# Patient Record
Sex: Female | Born: 1935 | Race: White | Hispanic: No | Smoking: Never smoker
Health system: Southern US, Community
[De-identification: ages and names within clinical notes are randomized; demographics above are authoritative.]

## PROBLEM LIST (undated history)

## (undated) DIAGNOSIS — G629 Polyneuropathy, unspecified: Secondary | ICD-10-CM

## (undated) DIAGNOSIS — I10 Essential (primary) hypertension: Secondary | ICD-10-CM

## (undated) DIAGNOSIS — E039 Hypothyroidism, unspecified: Secondary | ICD-10-CM

## (undated) DIAGNOSIS — K219 Gastro-esophageal reflux disease without esophagitis: Secondary | ICD-10-CM

## (undated) HISTORY — PX: ABDOMINAL HYSTERECTOMY: SHX81

## (undated) HISTORY — PX: CATARACT EXTRACTION: SUR2

## (undated) HISTORY — PX: BACK SURGERY: SHX140

---

## 2003-04-14 ENCOUNTER — Inpatient Hospital Stay (HOSPITAL_COMMUNITY): Admission: RE | Admit: 2003-04-14 | Discharge: 2003-04-15 | Payer: Self-pay | Admitting: Neurosurgery

## 2003-10-17 HISTORY — PX: UPPER GI ENDOSCOPY: SHX6162

## 2004-10-02 ENCOUNTER — Emergency Department: Payer: Self-pay | Admitting: Emergency Medicine

## 2005-02-24 ENCOUNTER — Ambulatory Visit: Payer: Self-pay | Admitting: Internal Medicine

## 2005-07-18 ENCOUNTER — Ambulatory Visit (HOSPITAL_COMMUNITY): Admission: RE | Admit: 2005-07-18 | Discharge: 2005-07-18 | Payer: Self-pay | Admitting: Ophthalmology

## 2005-10-17 ENCOUNTER — Encounter: Admission: RE | Admit: 2005-10-17 | Discharge: 2005-10-17 | Payer: Self-pay | Admitting: Neurosurgery

## 2005-11-08 ENCOUNTER — Inpatient Hospital Stay (HOSPITAL_COMMUNITY): Admission: RE | Admit: 2005-11-08 | Discharge: 2005-11-09 | Payer: Self-pay | Admitting: Neurosurgery

## 2007-07-22 IMAGING — CR DG CHEST 2V
2 series · 2 of 2 positions shown · non-contrast
Comparison: none

CLINICAL DATA: Dislocated lens.  Pre-op for surgery today.  Hypertension, some congestion and asthma.  Nonsmoker.  
 CHEST - 2 VIEW:
 PA and lateral views of the chest are made and are compared with report of a previous study of 04/10/2003 and show mild aortic elongation.   Heart is within the normal limit for size and contour.  The lungs are clear.  Bony thorax is normal except for small anterior degenerative hypertrophic spurs of the mid-thoracic spine.

[view not recorded (1 of 2)]
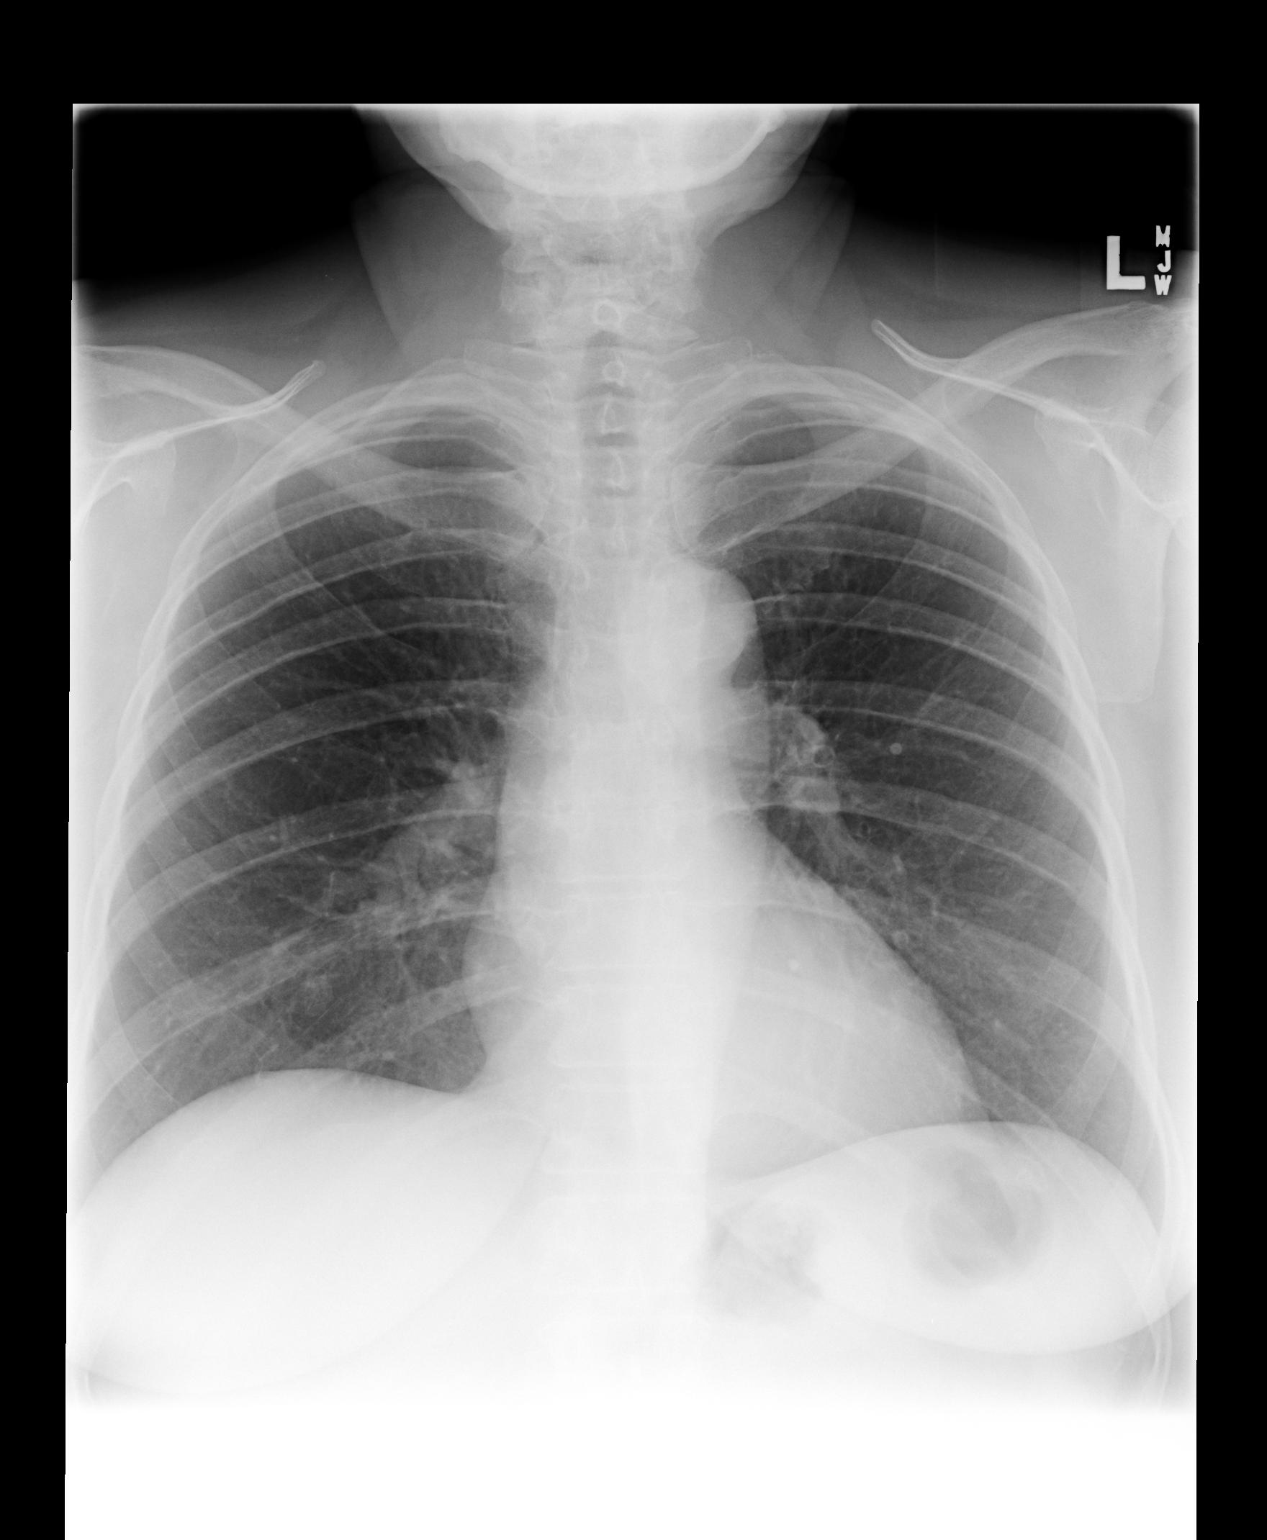

[view not recorded (2 of 2)]
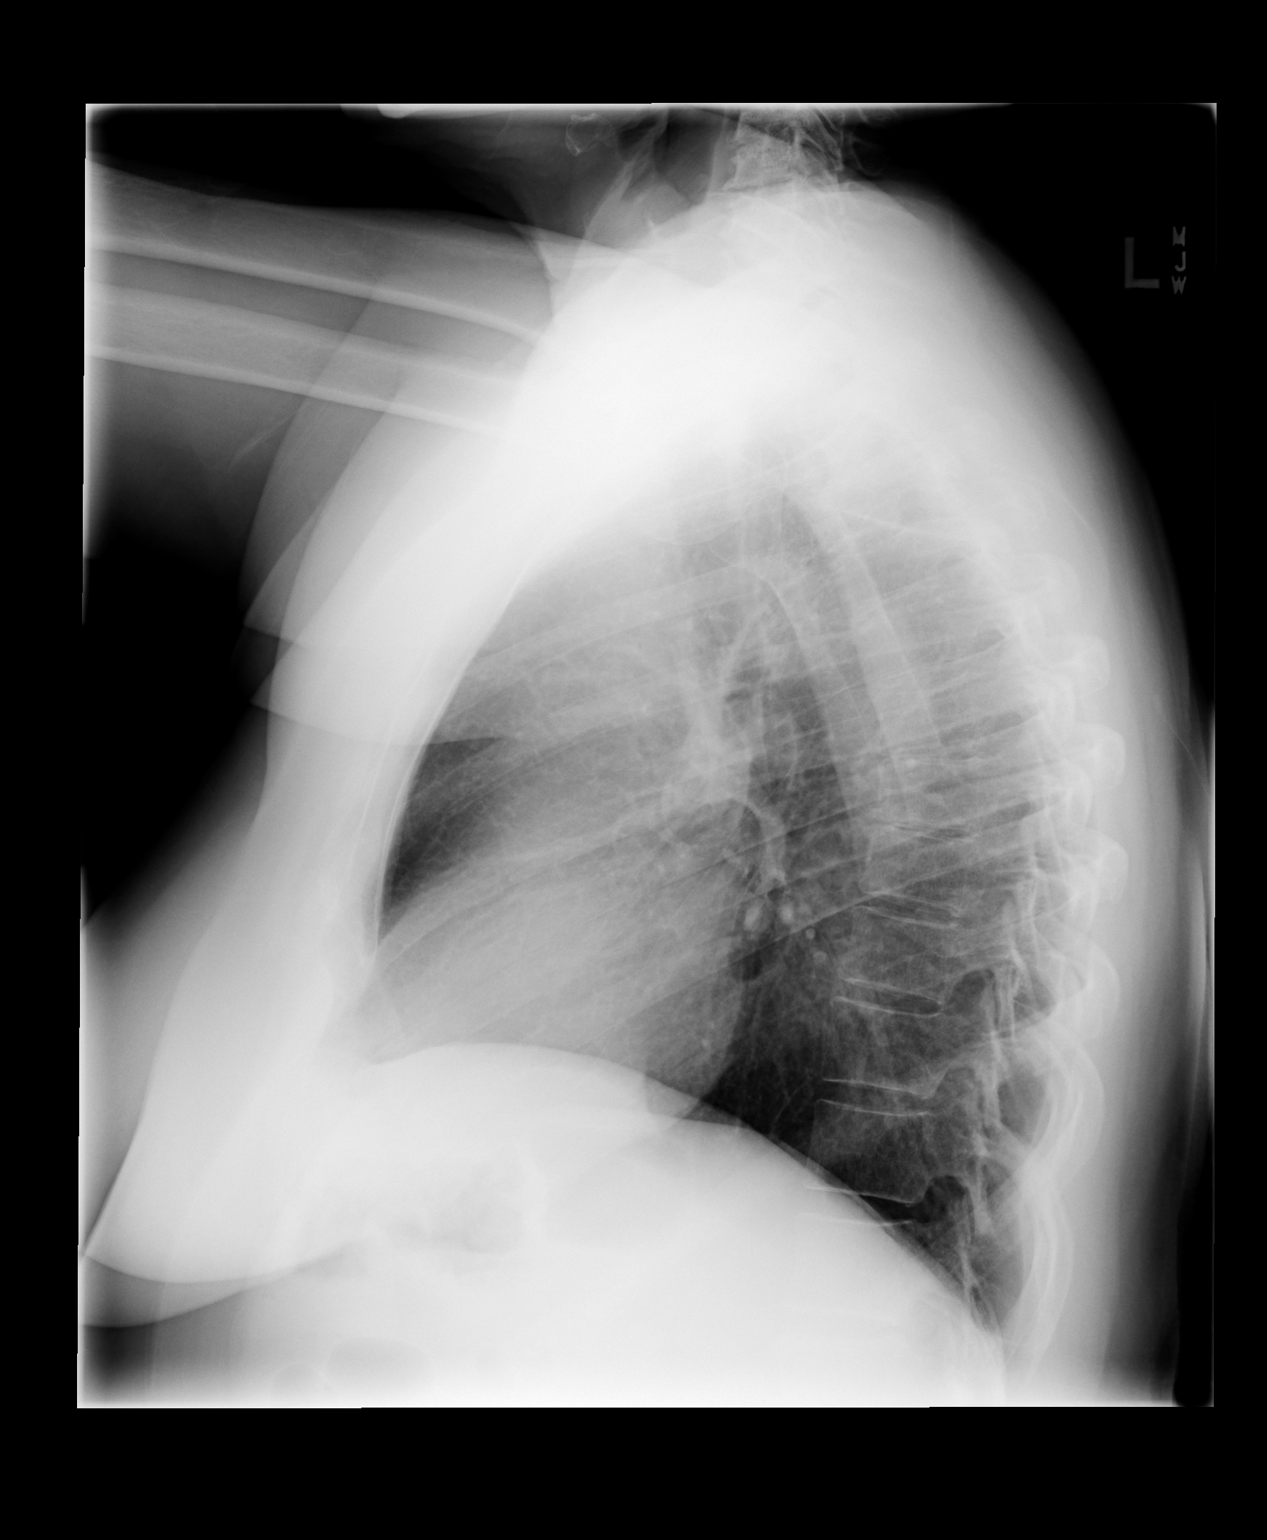

[2 of 2 positions shown; findings below may reference images not displayed]

IMPRESSION: No evidence of active cardiopulmonary disease.

## 2008-03-27 LAB — HM DEXA SCAN

## 2009-01-13 ENCOUNTER — Ambulatory Visit: Payer: Self-pay | Admitting: Family Medicine

## 2009-11-17 ENCOUNTER — Ambulatory Visit: Payer: Self-pay | Admitting: Family Medicine

## 2010-02-17 ENCOUNTER — Ambulatory Visit: Payer: Self-pay

## 2010-12-17 ENCOUNTER — Ambulatory Visit: Payer: Self-pay | Admitting: Unknown Physician Specialty

## 2010-12-17 LAB — HM COLONOSCOPY

## 2010-12-20 LAB — PATHOLOGY REPORT

## 2011-01-20 ENCOUNTER — Ambulatory Visit: Payer: Self-pay | Admitting: Family Medicine

## 2014-08-18 LAB — HEPATIC FUNCTION PANEL
ALT: 15 U/L (ref 7–35)
AST: 21 U/L (ref 13–35)
Alkaline Phosphatase: 65 U/L (ref 25–125)

## 2014-08-18 LAB — LIPID PANEL
Cholesterol: 188 mg/dL (ref 0–200)
HDL: 59 mg/dL (ref 35–70)
LDL Cholesterol: 102 mg/dL
LDl/HDL Ratio: 1.7
Triglycerides: 136 mg/dL (ref 40–160)

## 2014-08-18 LAB — CBC AND DIFFERENTIAL
HCT: 38 % (ref 36–46)
Hemoglobin: 12.4 g/dL (ref 12.0–16.0)
Neutrophils Absolute: 4 /uL
Platelets: 288 10*3/uL (ref 150–399)
WBC: 8.1 10^3/mL

## 2014-08-18 LAB — BASIC METABOLIC PANEL
BUN: 26 mg/dL — AB (ref 4–21)
Creatinine: 1 mg/dL (ref 0.5–1.1)
Glucose: 94 mg/dL
Potassium: 4.3 mmol/L (ref 3.4–5.3)
Sodium: 142 mmol/L (ref 137–147)

## 2014-08-18 LAB — TSH: TSH: 1.17 u[IU]/mL (ref 0.41–5.90)

## 2015-02-06 DIAGNOSIS — J309 Allergic rhinitis, unspecified: Secondary | ICD-10-CM | POA: Insufficient documentation

## 2015-02-06 DIAGNOSIS — M419 Scoliosis, unspecified: Secondary | ICD-10-CM | POA: Insufficient documentation

## 2015-02-06 DIAGNOSIS — M549 Dorsalgia, unspecified: Secondary | ICD-10-CM

## 2015-02-06 DIAGNOSIS — K219 Gastro-esophageal reflux disease without esophagitis: Secondary | ICD-10-CM | POA: Insufficient documentation

## 2015-02-06 DIAGNOSIS — Z86010 Personal history of colonic polyps: Secondary | ICD-10-CM | POA: Insufficient documentation

## 2015-02-06 DIAGNOSIS — E538 Deficiency of other specified B group vitamins: Secondary | ICD-10-CM | POA: Insufficient documentation

## 2015-02-06 DIAGNOSIS — E559 Vitamin D deficiency, unspecified: Secondary | ICD-10-CM | POA: Insufficient documentation

## 2015-02-06 DIAGNOSIS — G8929 Other chronic pain: Secondary | ICD-10-CM | POA: Insufficient documentation

## 2015-02-06 DIAGNOSIS — E7800 Pure hypercholesterolemia, unspecified: Secondary | ICD-10-CM | POA: Insufficient documentation

## 2015-02-06 DIAGNOSIS — N951 Menopausal and female climacteric states: Secondary | ICD-10-CM | POA: Insufficient documentation

## 2015-02-06 DIAGNOSIS — M8580 Other specified disorders of bone density and structure, unspecified site: Secondary | ICD-10-CM | POA: Insufficient documentation

## 2015-02-06 DIAGNOSIS — M1990 Unspecified osteoarthritis, unspecified site: Secondary | ICD-10-CM | POA: Insufficient documentation

## 2015-02-06 DIAGNOSIS — I10 Essential (primary) hypertension: Secondary | ICD-10-CM | POA: Insufficient documentation

## 2015-02-06 DIAGNOSIS — N393 Stress incontinence (female) (male): Secondary | ICD-10-CM | POA: Insufficient documentation

## 2015-02-06 DIAGNOSIS — J449 Chronic obstructive pulmonary disease, unspecified: Secondary | ICD-10-CM | POA: Insufficient documentation

## 2015-02-06 DIAGNOSIS — E039 Hypothyroidism, unspecified: Secondary | ICD-10-CM | POA: Insufficient documentation

## 2015-02-06 DIAGNOSIS — G629 Polyneuropathy, unspecified: Secondary | ICD-10-CM | POA: Insufficient documentation

## 2015-02-09 ENCOUNTER — Ambulatory Visit (INDEPENDENT_AMBULATORY_CARE_PROVIDER_SITE_OTHER): Payer: Medicare Other | Admitting: Family Medicine

## 2015-02-09 ENCOUNTER — Encounter: Payer: Self-pay | Admitting: Family Medicine

## 2015-02-09 VITALS — BP 120/78 | HR 60 | Temp 98.4°F | Resp 16 | Wt 138.4 lb

## 2015-02-09 DIAGNOSIS — K219 Gastro-esophageal reflux disease without esophagitis: Secondary | ICD-10-CM | POA: Diagnosis not present

## 2015-02-09 DIAGNOSIS — I10 Essential (primary) hypertension: Secondary | ICD-10-CM

## 2015-02-09 DIAGNOSIS — Z23 Encounter for immunization: Secondary | ICD-10-CM | POA: Diagnosis not present

## 2015-02-09 DIAGNOSIS — J309 Allergic rhinitis, unspecified: Secondary | ICD-10-CM | POA: Diagnosis not present

## 2015-02-09 DIAGNOSIS — E039 Hypothyroidism, unspecified: Secondary | ICD-10-CM | POA: Diagnosis not present

## 2015-02-09 MED ORDER — LORATADINE 10 MG PO TBDP: 10.0000 mg | ORAL_TABLET | Freq: Every day | ORAL | Status: DC

## 2015-02-09 NOTE — Progress Notes (Signed)
       Patient: Heidi Yoder SeenRebecca D Hiott Female    DOB: 1929/12/11   81 y.o.   MRN: [id number] Visit Date: 02/09/2015  Today's Provider: Megan Mansichard  Jr, MD   Chief Complaint  Patient presents with  . Hypertension  . Gastroesophageal Reflux  . Hypothyroidism   Subjective:    HPI 1. Hypothyroidism, unspecified hypothyroidism type: Last Clinic visit was 08/14/2014 for Hypothyroidism. No changes in management. No palpitations, insomnia, leg swelling lately, and no weakness.   2. Essential (primary) hypertension: Last office visit blood pressure was 124/60 and today 120/78. No changes in management. She is not having any chest pain,dizziness, headache or lightlessness.   3. Gastroesophageal reflux disease, esophagitis presence not specified: Per patient have not had any flare up. Last flare up was 2-3 weeks ago because ate some hot ships and was having a lot of pain in the night before bed time. Took some Tums and it helped.     No Known Allergies Previous Medications   ASPIRIN 81 MG TABLET    Take by mouth.   CALCIUM-VITAMIN D PO    Take by mouth.   CHOLECALCIFEROL (VITAMIN D) 1000 UNITS TABLET    Take by mouth.   HYDROCHLOROTHIAZIDE (HYDRODIURIL) 25 MG TABLET    Take by mouth.   LEVOTHYROXINE (SYNTHROID, LEVOTHROID) 50 MCG TABLET    Take by mouth.   LORATADINE (CLARITIN REDITABS) 10 MG DISSOLVABLE TABLET    Take by mouth.   MULTIPLE VITAMIN PO    Take by mouth.   OMEPRAZOLE (PRILOSEC) 20 MG CAPSULE    Take by mouth.   OXYBUTYNIN (DITROPAN) 5 MG TABLET    Take by mouth.   SIMVASTATIN (ZOCOR) 20 MG TABLET    Take by mouth.    Review of Systems  Constitutional: Negative.   HENT: Positive for rhinorrhea and sinus pressure.   Eyes: Positive for itching.  Respiratory: Positive for cough.   Cardiovascular: Negative.   Gastrointestinal: Negative.   Endocrine: Negative.   Neurological: Negative.   Hematological: Negative.   Psychiatric/Behavioral: Negative.     Social History   Substance Use Topics  . Smoking status: Never Smoker   . Smokeless tobacco: Not on file  . Alcohol Use: No   Objective:   BP 120/78 mmHg  Pulse 60  Temp(Src) 98.4 F (36.9 C) (Oral)  Resp 16  Wt 138 lb 6.4 oz (62.778 kg)  Physical Exam  Constitutional: She is oriented to person, place, and time. She appears well-developed and well-nourished.  HENT:  Head: Normocephalic and atraumatic.  Right Ear: External ear normal.  Left Ear: External ear normal.  Nose: Nose normal.  Eyes: Conjunctivae are normal.  Cardiovascular: Normal rate, regular rhythm and normal heart sounds.   Pulmonary/Chest: Effort normal and breath sounds normal.  Abdominal: Soft.  Neurological: She is alert and oriented to person, place, and time.  Skin: Skin is warm and dry.  Psychiatric: She has a normal mood and affect. Her behavior is normal. Judgment and thought content normal.        Assessment & Plan:     1. Hypothyroidism, unspecified hypothyroidism type   2. Essential (primary) hypertension   3. Gastroesophageal reflux disease, esophagitis presence not specified 4.AR Try Loratadine 10mg  daily.      Carsyn Boster Wendelyn Breslow Jr, MD  [facility]

## 2015-03-17 ENCOUNTER — Other Ambulatory Visit: Payer: Self-pay | Admitting: Family Medicine

## 2015-04-03 ENCOUNTER — Encounter: Payer: Self-pay | Admitting: Emergency Medicine

## 2015-04-03 ENCOUNTER — Emergency Department: Payer: Medicare Other

## 2015-04-03 ENCOUNTER — Inpatient Hospital Stay
Admission: EM | Admit: 2015-04-03 | Discharge: 2015-04-07 | DRG: 482 | Disposition: A | Discharge status: Home Health | Payer: Medicare Other | Attending: Internal Medicine | Admitting: Internal Medicine

## 2015-04-03 DIAGNOSIS — I10 Essential (primary) hypertension: Secondary | ICD-10-CM | POA: Diagnosis present

## 2015-04-03 DIAGNOSIS — E876 Hypokalemia: Secondary | ICD-10-CM | POA: Diagnosis present

## 2015-04-03 DIAGNOSIS — E039 Hypothyroidism, unspecified: Secondary | ICD-10-CM | POA: Diagnosis present

## 2015-04-03 DIAGNOSIS — S72002A Fracture of unspecified part of neck of left femur, initial encounter for closed fracture: Secondary | ICD-10-CM

## 2015-04-03 DIAGNOSIS — G629 Polyneuropathy, unspecified: Secondary | ICD-10-CM | POA: Diagnosis present

## 2015-04-03 DIAGNOSIS — W1809XA Striking against other object with subsequent fall, initial encounter: Secondary | ICD-10-CM | POA: Diagnosis present

## 2015-04-03 DIAGNOSIS — K219 Gastro-esophageal reflux disease without esophagitis: Secondary | ICD-10-CM | POA: Diagnosis present

## 2015-04-03 DIAGNOSIS — E785 Hyperlipidemia, unspecified: Secondary | ICD-10-CM | POA: Diagnosis present

## 2015-04-03 DIAGNOSIS — M810 Age-related osteoporosis without current pathological fracture: Secondary | ICD-10-CM | POA: Diagnosis present

## 2015-04-03 DIAGNOSIS — Y92009 Unspecified place in unspecified non-institutional (private) residence as the place of occurrence of the external cause: Secondary | ICD-10-CM

## 2015-04-03 DIAGNOSIS — S72035A Nondisplaced midcervical fracture of left femur, initial encounter for closed fracture: Principal | ICD-10-CM | POA: Diagnosis present

## 2015-04-03 HISTORY — DX: Polyneuropathy, unspecified: G62.9

## 2015-04-03 HISTORY — DX: Gastro-esophageal reflux disease without esophagitis: K21.9

## 2015-04-03 HISTORY — DX: Hypothyroidism, unspecified: E03.9

## 2015-04-03 HISTORY — DX: Essential (primary) hypertension: I10

## 2015-04-03 LAB — URINALYSIS COMPLETE WITH MICROSCOPIC (ARMC ONLY)
BILIRUBIN URINE: NEGATIVE
Bacteria, UA: NONE SEEN
GLUCOSE, UA: NEGATIVE mg/dL
HGB URINE DIPSTICK: NEGATIVE
KETONES UR: NEGATIVE mg/dL
LEUKOCYTES UA: NEGATIVE
NITRITE: NEGATIVE
Protein, ur: NEGATIVE mg/dL
RBC / HPF: NONE SEEN RBC/hpf (ref 0–5)
SPECIFIC GRAVITY, URINE: 1.009 (ref 1.005–1.030)
Squamous Epithelial / LPF: NONE SEEN
pH: 6 (ref 5.0–8.0)

## 2015-04-03 LAB — COMPREHENSIVE METABOLIC PANEL
ALT: 20 U/L (ref 14–54)
AST: 24 U/L (ref 15–41)
Albumin: 3.9 g/dL (ref 3.5–5.0)
Alkaline Phosphatase: 50 U/L (ref 38–126)
Anion gap: 11 (ref 5–15)
BUN: 20 mg/dL (ref 6–20)
CHLORIDE: 104 mmol/L (ref 101–111)
CO2: 27 mmol/L (ref 22–32)
CREATININE: 0.83 mg/dL (ref 0.44–1.00)
Calcium: 9.7 mg/dL (ref 8.9–10.3)
GFR calc Af Amer: >60 mL/min (ref >60)
GFR calc non Af Amer: >60 mL/min (ref >60)
GLUCOSE: 89 mg/dL (ref 65–99)
Potassium: 3.3 mmol/L — ABNORMAL LOW (ref 3.5–5.1)
SODIUM: 142 mmol/L (ref 135–145)
Total Bilirubin: 0.5 mg/dL (ref 0.3–1.2)
Total Protein: 7.6 g/dL (ref 6.5–8.1)

## 2015-04-03 LAB — CBC WITH DIFFERENTIAL/PLATELET
BASOS ABS: 0.1 10*3/uL (ref 0–0.1)
Basophils Relative: 1 %
EOS ABS: 0 10*3/uL (ref 0–0.7)
EOS PCT: 0 %
HCT: 35.8 % (ref 35.0–47.0)
HEMOGLOBIN: 11.7 g/dL — AB (ref 12.0–16.0)
LYMPHS PCT: 17 %
Lymphs Abs: 2.5 10*3/uL (ref 1.0–3.6)
MCH: 30.1 pg (ref 26.0–34.0)
MCHC: 32.7 g/dL (ref 32.0–36.0)
MCV: 92 fL (ref 80.0–100.0)
Monocytes Absolute: 0.7 10*3/uL (ref 0.2–0.9)
Monocytes Relative: 5 %
NEUTROS PCT: 77 %
Neutro Abs: 11 10*3/uL — ABNORMAL HIGH (ref 1.4–6.5)
PLATELETS: 257 10*3/uL (ref 150–440)
RBC: 3.89 MIL/uL (ref 3.80–5.20)
RDW: 13 % (ref 11.5–14.5)
WBC: 14.3 10*3/uL — AB (ref 3.6–11.0)

## 2015-04-03 LAB — PROTIME-INR
INR: 1.06
Prothrombin Time: 14 seconds (ref 11.4–15.0)

## 2015-04-03 LAB — TYPE AND SCREEN
ABO/RH(D): A POS
Antibody Screen: NEGATIVE

## 2015-04-03 LAB — APTT: aPTT: 34 seconds (ref 24–36)

## 2015-04-03 LAB — TSH: TSH: 0.897 u[IU]/mL (ref 0.350–4.500)

## 2015-04-03 LAB — ABO/RH: ABO/RH(D): A POS

## 2015-04-03 MED ORDER — POLYETHYLENE GLYCOL 3350 17 G PO PACK: 17.0000 g | PACK | Freq: Every day | ORAL | Status: DC | PRN

## 2015-04-03 MED ORDER — OXYBUTYNIN CHLORIDE 5 MG PO TABS
5.0000 mg | ORAL_TABLET | Freq: Two times a day (BID) | ORAL | Status: DC
  Administered 2015-04-03: 5 mg via ORAL
  Filled 2015-04-03: qty 1

## 2015-04-03 MED ORDER — ACETAMINOPHEN 650 MG RE SUPP: 650.0000 mg | Freq: Four times a day (QID) | RECTAL | Status: DC | PRN

## 2015-04-03 MED ORDER — ONDANSETRON HCL 4 MG PO TABS: 4.0000 mg | ORAL_TABLET | Freq: Four times a day (QID) | ORAL | Status: DC | PRN

## 2015-04-03 MED ORDER — SODIUM CHLORIDE 0.45 % IV SOLN
INTRAVENOUS | Status: DC
  Administered 2015-04-03: 17:00:00 via INTRAVENOUS

## 2015-04-03 MED ORDER — POVIDONE-IODINE 7.5 % EX SOLN
Freq: Once | CUTANEOUS | Status: DC
Start: 2015-04-03 — End: 2015-04-04
  Filled 2015-04-03: qty 118

## 2015-04-03 MED ORDER — CALCIUM CARBONATE-VITAMIN D 500-200 MG-UNIT PO TABS
1.0000 | ORAL_TABLET | Freq: Two times a day (BID) | ORAL | Status: DC
  Administered 2015-04-03: 1 via ORAL
  Filled 2015-04-03: qty 1

## 2015-04-03 MED ORDER — PANTOPRAZOLE SODIUM 40 MG PO TBEC
40.0000 mg | DELAYED_RELEASE_TABLET | Freq: Every day | ORAL | Status: DC
  Administered 2015-04-03: 40 mg via ORAL

## 2015-04-03 MED ORDER — ZOLPIDEM TARTRATE 5 MG PO TABS: 5.0000 mg | ORAL_TABLET | Freq: Every evening | ORAL | Status: DC | PRN

## 2015-04-03 MED ORDER — HEPARIN SODIUM (PORCINE) 5000 UNIT/ML IJ SOLN
5000.0000 [IU] | Freq: Three times a day (TID) | INTRAMUSCULAR | Status: DC
  Filled 2015-04-03: qty 1

## 2015-04-03 MED ORDER — POTASSIUM CHLORIDE 20 MEQ PO PACK
40.0000 meq | PACK | Freq: Every day | ORAL | Status: DC
  Filled 2015-04-03: qty 2

## 2015-04-03 MED ORDER — ACETAMINOPHEN 325 MG PO TABS: 650.0000 mg | ORAL_TABLET | Freq: Four times a day (QID) | ORAL | Status: DC | PRN

## 2015-04-03 MED ORDER — METHOCARBAMOL 1000 MG/10ML IJ SOLN
500.0000 mg | Freq: Four times a day (QID) | INTRAMUSCULAR | Status: DC | PRN
  Filled 2015-04-03: qty 5

## 2015-04-03 MED ORDER — POTASSIUM CHLORIDE CRYS ER 20 MEQ PO TBCR
40.0000 meq | EXTENDED_RELEASE_TABLET | Freq: Once | ORAL | Status: AC
  Administered 2015-04-03: 40 meq via ORAL
  Filled 2015-04-03: qty 2

## 2015-04-03 MED ORDER — POTASSIUM CHLORIDE 20 MEQ PO PACK
20.0000 meq | PACK | Freq: Every day | ORAL | Status: DC
  Administered 2015-04-03: 20 meq via ORAL

## 2015-04-03 MED ORDER — MORPHINE SULFATE (PF) 2 MG/ML IV SOLN
0.5000 mg | INTRAVENOUS | Status: DC | PRN
  Administered 2015-04-03 – 2015-04-04 (×3): 0.5 mg via INTRAVENOUS
  Filled 2015-04-03 (×3): qty 1

## 2015-04-03 MED ORDER — HYDROCHLOROTHIAZIDE 25 MG PO TABS
25.0000 mg | ORAL_TABLET | Freq: Every day | ORAL | Status: DC
  Filled 2015-04-03: qty 1

## 2015-04-03 MED ORDER — HYDROCODONE-ACETAMINOPHEN 5-325 MG PO TABS: 1.0000 | ORAL_TABLET | Freq: Four times a day (QID) | ORAL | Status: DC | PRN

## 2015-04-03 MED ORDER — CEFAZOLIN SODIUM-DEXTROSE 2-3 GM-% IV SOLR
2.0000 g | INTRAVENOUS | Status: DC
  Filled 2015-04-03: qty 50

## 2015-04-03 MED ORDER — ASPIRIN 81 MG PO TABS: 81.0000 mg | ORAL_TABLET | Freq: Every day | ORAL | Status: DC

## 2015-04-03 MED ORDER — HYDROCODONE-ACETAMINOPHEN 5-325 MG PO TABS
1.0000 | ORAL_TABLET | ORAL | Status: DC | PRN
  Administered 2015-04-03 – 2015-04-04 (×3): 1 via ORAL
  Filled 2015-04-03 (×3): qty 1

## 2015-04-03 MED ORDER — METHOCARBAMOL 500 MG PO TABS: 500.0000 mg | ORAL_TABLET | Freq: Four times a day (QID) | ORAL | Status: DC | PRN

## 2015-04-03 MED ORDER — ONDANSETRON HCL 4 MG/2ML IJ SOLN
4.0000 mg | Freq: Four times a day (QID) | INTRAMUSCULAR | Status: DC | PRN
  Administered 2015-04-04: 4 mg via INTRAVENOUS
  Filled 2015-04-03: qty 2

## 2015-04-03 MED ORDER — VITAMIN D 1000 UNITS PO TABS
1000.0000 [IU] | ORAL_TABLET | Freq: Every day | ORAL | Status: DC
  Filled 2015-04-03: qty 1

## 2015-04-03 MED ORDER — SENNA 8.6 MG PO TABS
1.0000 | ORAL_TABLET | Freq: Two times a day (BID) | ORAL | Status: DC
  Administered 2015-04-03: 8.6 mg via ORAL
  Filled 2015-04-03: qty 1

## 2015-04-03 MED ORDER — MORPHINE SULFATE (PF) 2 MG/ML IV SOLN: 2.0000 mg | INTRAVENOUS | Status: DC | PRN

## 2015-04-03 MED ORDER — SIMVASTATIN 20 MG PO TABS
20.0000 mg | ORAL_TABLET | Freq: Every day | ORAL | Status: DC
  Administered 2015-04-03: 20 mg via ORAL
  Filled 2015-04-03: qty 1

## 2015-04-03 MED ORDER — LEVOTHYROXINE SODIUM 50 MCG PO TABS: 50.0000 ug | ORAL_TABLET | Freq: Every day | ORAL | Status: DC

## 2015-04-03 NOTE — ED Notes (Signed)
Pt presents with left upper leg pain after falling this am and landing on her left side. Pt states she tripped and fell on hard wood flooring. Pt unable to bear full weight on left leg. No shortening or rotation noted. Cms intact.

## 2015-04-03 NOTE — ED Notes (Signed)
States she tripped this am  Larey SeatFell  Having pain to left hip/leg

## 2015-04-03 NOTE — Progress Notes (Signed)
Plan of care discussed with patient. Patient scheduled for surgery 04/04/2015. Patient teaching giving to patient on NPO status after midnight. Patient demonstrates correct use of incentive spirometer. Pain controlled with PRN medication.

## 2015-04-03 NOTE — H&P (Signed)
[facility] [place] [facility]   PATIENT NAME: Heidi Yoder    MR#:  [id number]  DATE OF BIRTH:  1929-09-20  DATE OF ADMISSION:  04/03/2015  PRIMARY CARE PHYSICIAN: Megan Mans, MD   REQUESTING/REFERRING PHYSICIAN: Dr. Alphonzo Lemmings  CHIEF COMPLAINT:   Chief Complaint  Patient presents with  . Fall    HISTORY OF PRESENT ILLNESS:  Heidi Yoder  is a 81 y.o. female with a known history of bilateral lower extremity peripheral neuropathy, hypothyroidism, hypertension and GERD presents today after mechanical fall and is found to have left femoral neck fracture. She reports that she was on the phone this morning and after hanging up she turned to walk to the door and tripped over the cord to her husband's electronic chair. She fell on her left side and then had to slide across the floor so she could pull up. She then had to walk to the front door to get her husband's attention. She had significant pain in the hip and leg and so was brought to the emergency room. X-ray shows minimally displaced left femoral transcervical neck fracture. She has been seen by orthopedics and plan is for surgical repair in the morning.  PAST MEDICAL HISTORY:   Past Medical History  Diagnosis Date  . Hypertension   . Hypothyroidism   . GERD (gastroesophageal reflux disease)   . Peripheral neuropathy (HCC)     PAST SURGICAL HISTORY:   Past Surgical History  Procedure Laterality Date  . Back surgery      spinal stenosis and back surgery  . Abdominal hysterectomy      due to cervical hyperlasia  . Cataract extraction Bilateral   . Upper gi endoscopy  10/17/03    normal duodenum, normal esophagus, normal stomach    SOCIAL HISTORY:   Social History  Substance Use Topics  . Smoking status: Never Smoker   . Smokeless tobacco: Not on file  . Alcohol Use: No    FAMILY HISTORY:   Family History  Problem Relation Age of Onset  . Ovarian cancer Mother   . CVA  Father   . Hypertension Sister   . Colon polyps Sister   . Heart disease Brother   . Colon polyps Brother     DRUG ALLERGIES:  No Known Allergies  REVIEW OF SYSTEMS:   Review of Systems  Constitutional: Negative for fever, chills, weight loss and malaise/fatigue.  HENT: Negative for congestion and hearing loss.   Eyes: Negative for blurred vision and pain.  Respiratory: Negative for cough, hemoptysis, sputum production, shortness of breath and stridor.   Cardiovascular: Negative for chest pain, palpitations, orthopnea and leg swelling.  Gastrointestinal: Negative for nausea, vomiting, abdominal pain, diarrhea, constipation and blood in stool.  Genitourinary: Negative for dysuria and frequency.  Musculoskeletal: Negative for myalgias, back pain, joint pain and neck pain.  Skin: Negative for rash.  Neurological: Positive for tingling and sensory change. Negative for focal weakness, loss of consciousness and headaches.  Endo/Heme/Allergies: Does not bruise/bleed easily.  Psychiatric/Behavioral: Negative for depression and hallucinations. The patient is not nervous/anxious.     MEDICATIONS AT HOME:   Prior to Admission medications   Medication Sig Start Date End Date Taking? Authorizing Provider  aspirin 81 MG tablet Take by mouth. 03/21/11  Yes Historical Provider, MD  CALCIUM-VITAMIN D PO Take 600 mg by mouth 2 (two) times daily.  03/21/11  Yes Historical Provider, MD  cholecalciferol (VITAMIN D) 1000 UNITS tablet Take 1,000 Units  by mouth daily.  03/21/11  Yes Historical Provider, MD  Cyanocobalamin (VITAMIN B 12 PO) Take 1 tablet by mouth daily.   Yes Historical Provider, MD  hydrochlorothiazide (HYDRODIURIL) 25 MG tablet TAKE 1 TABLET DAILY 03/17/15  Yes Richard Hulen Shouts., MD  levothyroxine (SYNTHROID, LEVOTHROID) 50 MCG tablet Take 50 mcg by mouth daily before breakfast.  07/08/14  Yes Historical Provider, MD  MULTIPLE VITAMIN PO Take by mouth. 03/21/11  Yes Historical  Provider, MD  omeprazole (PRILOSEC) 20 MG capsule Take 20 mg by mouth daily.  05/30/14  Yes Historical Provider, MD  oxybutynin (DITROPAN) 5 MG tablet Take 5 mg by mouth.  07/08/14  Yes Historical Provider, MD  simvastatin (ZOCOR) 20 MG tablet Take by mouth. 09/12/14  Yes Historical Provider, MD  loratadine (CLARITIN REDITABS) 10 MG dissolvable tablet Take 1 tablet (10 mg total) by mouth daily. Patient not taking: Reported on 04/03/2015 02/09/15   Maple Hudson., MD      VITAL SIGNS:  Blood pressure 160/66, pulse 65, temperature 97.9 F (36.6 C), temperature source Oral, resp. rate 18, height  (1.549 m), weight 63.05 kg (139 lb), SpO2 96 %.  PHYSICAL EXAMINATION:  GENERAL:  81 y.o.-year-old patient lying in the bed with no acute distress.  EYES: Pupils equal, round, reactive to light and accommodation. No scleral icterus. Extraocular muscles intact.  HEENT: Head atraumatic, normocephalic. Oropharynx and nasopharynx clear. Oral mucosa pink and moist NECK:  Supple, no jugular venous distention. No thyroid enlargement, no tenderness.  LUNGS: Normal breath sounds bilaterally, no wheezing, rales,rhonchi or crepitation. No use of accessory muscles of respiration.  CARDIOVASCULAR: S1, S2 normal. No murmurs, rubs, or gallops.  ABDOMEN: Soft, nontender, nondistended. Bowel sounds present. No organomegaly or mass. No guarding no rebound EXTREMITIES: No pedal edema, cyanosis, or clubbing. Pulses 2+ NEUROLOGIC: Cranial nerves II through XII are intact. Muscle strength 5/5 in all extremities. Sensation intact. Gait not checked.  PSYCHIATRIC: The patient is alert and oriented x 3.  SKIN: No obvious rash, lesion, or ulcer.   LABORATORY PANEL:   CBC  Recent Labs Lab 04/03/15 1618  WBC 14.3*  HGB 11.7*  HCT 35.8  PLT 257   ------------------------------------------------------------------------------------------------------------------  Chemistries   Recent Labs Lab 04/03/15 1618   NA 142  K 3.3*  CL 104  CO2 27  GLUCOSE 89  BUN 20  CREATININE 0.83  CALCIUM 9.7  AST 24  ALT 20  ALKPHOS 50  BILITOT 0.5   ------------------------------------------------------------------------------------------------------------------  Cardiac Enzymes No results for input(s): TROPONINI in the last 168 hours. ------------------------------------------------------------------------------------------------------------------  RADIOLOGY:  Dg Hip Unilat With Pelvis 2-3 Views Left  04/03/2015  CLINICAL DATA:  81 year old female with history of trauma from a fall onto her left hip earlier today complaining of left-sided hip pain with movement. Unable to fully bear weight on the left side, and unable to walk or stand. EXAM: DG HIP (WITH OR WITHOUT PELVIS) 2-3V LEFT COMPARISON:  No priors. FINDINGS: AP view of the bony pelvis demonstrates no acute displaced fracture of the bony pelvic ring. Right proximal femur as visualized appears intact. Two views of the left hip demonstrate a transcervical femoral neck fracture with slight medial displacement and likely a small amount of proximal migration of the distal fracture fragment. No significant angulation. The left femoral head remains located in the left acetabulum. IMPRESSION: 1. Minimally displaced nonangulated left femoral transcervical neck fracture, as above. Electronically Signed   By: Trudie Reed M.D.   On: 04/03/2015  13:01    EKG:  No orders found for this or any previous visit.  IMPRESSION AND PLAN:   1. Left femoral neck fracture: - She seems to be in good health and is a low risk surgical candidate. We'll get an EKG tonight. - She states that she has no known heart disease. She does have hyperlipidemia and hypertension and is on a statin. She is also on aspirin which I will hold tonight but which should be resumed after surgery - Plan is for surgical correction in the morning by Dr. Hyacinth MeekerMiller. DVT prophylaxis, physical therapy,  pain control.  2. Hypertension - Blood pressure slightly elevated currently, likely due to pain, continue morphine and Tylenol - Continue hydrochlorothiazide  3. Hypothyroidism -Continue Synthroid  4. Bilateral lower extremity peripheral neuropathy - She states this has been present for years and is idiopathic I will check a B12 level as well as a TSH. This may be contributing to falls and lack of balance. We'll have physical therapy evaluation prior to discharge  5. Hyperlipidemia - Continue statin    All the records are reviewed and case discussed with ED provider. Management plans discussed with the patient, family and they are in agreement.  CODE STATUS: Full  TOTAL TIME TAKING CARE OF THIS PATIENT: 45 minutes.  Greater than 50% of time spent in coordination of care and counseling.  Elby ShowersWALSH, CATHERINE M.D on 04/03/2015 at 5:46 PM  Between 7am to 6pm - Pager - [phone]  After 6pm go to [email or web address] - password [email or web address] [facility]  [facility]  Office  [phone]  CC: Primary care physician; Megan Mansichard Gilbert Jr, MD

## 2015-04-03 NOTE — ED Provider Notes (Signed)
[facility] Emergency Department Provider Note  ____________________________________________  Time seen: Approximately 1:30 PM  I have reviewed the triage vital signs and the nursing notes.   HISTORY  Chief Complaint Fall    HPI Heidi Yoder is a 81 y.o. female who presents emergency Department with left hip pain status post fall this morning. She states that she was walking through her kitchen, tripped, fell landing on left hip. She was able to sign herself across the floor until she could pull up on something to stand. She states that she is having pain to the lateral aspect as well as pain over the left inguinal region. Patient states that pain was severe with any weightbearing. Pain is mild to moderate while sitting without movement. She denies any paresthesias or numbness or tingling in lower extremity. She denies any other injury or complaint. She did not hit her head or lose consciousness, no headache, no visual acuity changes, no neck pain, no chest pain or shortness of breath, no abdominal pain or nausea or vomiting.   Past Medical History  Diagnosis Date  . Hypertension   . Hypothyroidism   . GERD (gastroesophageal reflux disease)   . Peripheral neuropathy [facility])     Patient Active Problem List   Diagnosis Date Noted  . Closed left hip fracture (HCC) 04/03/2015  . Allergic rhinitis 02/06/2015  . Back pain, chronic 02/06/2015  . CAFL (chronic airflow limitation) (HCC) 02/06/2015  . Essential (primary) hypertension 02/06/2015  . GERD (gastroesophageal reflux disease) 02/06/2015  . History of colon polyps 02/06/2015  . Hypercholesteremia 02/06/2015  . Hypothyroidism 02/06/2015  . Arthritis, degenerative 02/06/2015  . Neuropathy (HCC) 02/06/2015  . Osteopenia 02/06/2015  . Post menopausal syndrome 02/06/2015  . Scoliosis 02/06/2015  . Female stress incontinence 02/06/2015  . B12 deficiency 02/06/2015  . Avitaminosis D 02/06/2015     Past Surgical History  Procedure Laterality Date  . Back surgery      spinal stenosis and back surgery  . Abdominal hysterectomy      due to cervical hyperlasia  . Cataract extraction Bilateral   . Upper gi endoscopy  10/17/03    normal duodenum, normal esophagus, normal stomach  . Hip pinning,cannulated Left 04/04/2015    Procedure: CANNULATED HIP PINNING;  Surgeon: Deeann Saint, MD;  Location: [facility] ORS;  Service: Orthopedics;  Laterality: Left;    Current Outpatient Rx  Name  Route  Sig  Dispense  Refill  . CALCIUM-VITAMIN D PO   Oral   Take 600 mg by mouth 2 (two) times daily.          . cholecalciferol (VITAMIN D) 1000 UNITS tablet   Oral   Take 1,000 Units by mouth daily.          . Cyanocobalamin (VITAMIN B 12 PO)   Oral   Take 1 tablet by mouth daily.         . hydrochlorothiazide (HYDRODIURIL) 25 MG tablet      TAKE 1 TABLET DAILY   90 tablet   4   . levothyroxine (SYNTHROID, LEVOTHROID) 50 MCG tablet   Oral   Take 50 mcg by mouth daily before breakfast.          . MULTIPLE VITAMIN PO   Oral   Take by mouth.         Marland Kitchen omeprazole (PRILOSEC) 20 MG capsule   Oral   Take 20 mg by mouth daily.          Marland Kitchen  oxybutynin (DITROPAN) 5 MG tablet   Oral   Take 5 mg by mouth.          . simvastatin (ZOCOR) 20 MG tablet   Oral   Take by mouth.         Marland Kitchen aspirin EC 325 MG EC tablet   Oral   Take 1 tablet (325 mg total) by mouth 2 (two) times daily.   30 tablet   0   . ferrous sulfate 325 (65 FE) MG tablet   Oral   Take 1 tablet (325 mg total) by mouth daily with breakfast.   30 tablet   3   . HYDROcodone-acetaminophen (NORCO/VICODIN) 5-325 MG tablet   Oral   Take 1 tablet by mouth every 6 (six) hours as needed for moderate pain or severe pain.   30 tablet   0   . loratadine (CLARITIN REDITABS) 10 MG dissolvable tablet   Oral   Take 1 tablet (10 mg total) by mouth daily. Patient not taking: Reported on 04/03/2015   30 tablet    12   . magnesium hydroxide (MILK OF MAGNESIA) 400 MG/5ML suspension   Oral   Take 30 mLs by mouth daily as needed for mild constipation.   360 mL   0   . senna (SENOKOT) 8.6 MG TABS tablet   Oral   Take 1 tablet (8.6 mg total) by mouth 2 (two) times daily.   20 each   0     Allergies Review of patient's allergies indicates no known allergies.  Family History  Problem Relation Age of Onset  . Ovarian cancer Mother   . CVA Father   . Hypertension Sister   . Colon polyps Sister   . Heart disease Brother   . Colon polyps Brother     Social History Social History  Substance Use Topics  . Smoking status: Never Smoker   . Smokeless tobacco: None  . Alcohol Use: No    Review of Systems Constitutional: No fever/chills Eyes: No visual changes. ENT: No sore throat. Cardiovascular: Denies chest pain. Respiratory: Denies shortness of breath. Gastrointestinal: No abdominal pain.  No nausea, no vomiting.  No diarrhea.  No constipation. Genitourinary: Negative for dysuria. Musculoskeletal: Negative for back pain. Skin: Negative for rash. Neurological: Negative for headaches, focal weakness or numbness.  10-point ROS otherwise negative.  ____________________________________________   PHYSICAL EXAM:  VITAL SIGNS: ED Triage Vitals  Enc Vitals Group     BP 04/03/15 1116 143/69 mmHg     Pulse Rate 04/03/15 1116 67     Resp 04/03/15 1116 18     Temp 04/03/15 1116 98.2 F (36.8 C)     Temp Source 04/03/15 1116 Oral     SpO2 04/03/15 1116 97 %     Weight 04/03/15 1116 139 lb (63.05 kg)     Height 04/03/15 1116 5\' 1"  (1.549 m)     Head Cir --      Peak Flow --      Pain Score --      Pain Loc --      Pain Edu? --      Excl. in GC? --     Constitutional: Alert and oriented. Well appearing and in no acute distress. Eyes: Conjunctivae are normal. PERRL. EOMI. Head: Atraumatic. Nose: No congestion/rhinnorhea. Mouth/Throat: Mucous membranes are moist.  Oropharynx  non-erythematous. Neck: No stridor.  No cervical spine tenderness to palpation. Cardiovascular: Normal rate, regular rhythm. Grossly normal heart sounds.  Good  peripheral circulation. Respiratory: Normal respiratory effort.  No retractions. Lungs CTAB. Gastrointestinal: Soft and nontender. No distention. No abdominal bruits. No CVA tenderness. Musculoskeletal: No visible deformity to inspection. Patient is tender to palpation over the left trochanteric region as well as the left inguinal region. Range of motion exercises was not attempted.  No joint effusions. Neurologic:  Normal speech and language. No gross focal neurologic deficits are appreciated. No gait instability. Skin:  Skin is warm, dry and intact. No rash noted. Psychiatric: Mood and affect are normal. Speech and behavior are normal.  ____________________________________________   LABS (all labs ordered are listed, but only abnormal results are displayed)  Labs Reviewed  CBC WITH DIFFERENTIAL/PLATELET - Abnormal; Notable for the following:    WBC 14.3 (*)    Hemoglobin 11.7 (*)    Neutro Abs 11.0 (*)    All other components within normal limits  COMPREHENSIVE METABOLIC PANEL - Abnormal; Notable for the following:    Potassium 3.3 (*)    All other components within normal limits  CBC - Abnormal; Notable for the following:    RBC 3.53 (*)    Hemoglobin 11.3 (*)    HCT 32.3 (*)    All other components within normal limits  BASIC METABOLIC PANEL - Abnormal; Notable for the following:    Glucose, Bld 100 (*)    GFR calc non Af Amer 58 (*)    Anion gap 4 (*)    All other components within normal limits  URINALYSIS COMPLETEWITH MICROSCOPIC ([facility] ONLY) - Abnormal; Notable for the following:    Color, Urine YELLOW (*)    APPearance CLEAR (*)    All other components within normal limits  CBC - Abnormal; Notable for the following:    RBC 3.45 (*)    Hemoglobin 11.0 (*)    HCT 31.7 (*)    All other components within normal  limits  BASIC METABOLIC PANEL - Abnormal; Notable for the following:    Glucose, Bld 108 (*)    Creatinine, Ser 1.10 (*)    Calcium 8.6 (*)    GFR calc non Af Amer 45 (*)    GFR calc Af Amer 53 (*)    All other components within normal limits  CBC - Abnormal; Notable for the following:    RBC 3.52 (*)    Hemoglobin 11.0 (*)    HCT 32.5 (*)    All other components within normal limits  BASIC METABOLIC PANEL - Abnormal; Notable for the following:    Calcium 8.6 (*)    GFR calc non Af Amer 55 (*)    Anion gap 4 (*)    All other components within normal limits  CBC - Abnormal; Notable for the following:    RBC 3.75 (*)    Hemoglobin 11.6 (*)    HCT 34.6 (*)    All other components within normal limits  VITAMIN B12 - Abnormal; Notable for the following:    Vitamin B-12 1071 (*)    All other components within normal limits  MRSA PCR SCREENING  PROTIME-INR  APTT  TSH  TYPE AND SCREEN  ABO/RH   ____________________________________________  EKG   ____________________________________________  RADIOLOGY  Left hip x-ray Impression: Minimally displaced, non-angulated left femoral transcervical neck fracture. ____________________________________________   PROCEDURES  Procedure(s) performed: None  Critical Care performed: No  ____________________________________________   INITIAL IMPRESSION / ASSESSMENT AND PLAN / ED COURSE  Pertinent labs & imaging results that were available during my care of the patient  were reviewed by me and considered in my medical decision making (see chart for details).  Patient's diagnosis is consistent with left femoral neck fracture. I discussed patient's history, complaint, exam, and x-ray findings with Dr. Hyacinth MeekerMiller the on call orthopedic surgeon. He advised that the patient should be admitted and he would perform surgery in the morning. Dr. Hyacinth MeekerMiller states he will place orthopedic admission orders.  Dr. Alphonzo LemmingsMcShane was informed and evaluated  patient in the emergency department as well. Hospitalist was informed of patient's course, exam, radiological findings. They verbalized they will admit patient.  Patient is comfortable in the emergency department complaining of minimal pain. Patient was offered narcotic pain medication for any increase of her symptoms. ____________________________________________   FINAL CLINICAL IMPRESSION(S) / ED DIAGNOSES  Final diagnoses:  Femoral neck fracture, left, closed, initial encounter      Racheal PatchesJonathan D Saharsh Sterling, PA-C 04/21/15 1534  Jeanmarie PlantJames A McShane, MD 04/21/15 1622

## 2015-04-03 NOTE — Consult Note (Signed)
ORTHOPAEDIC CONSULTATION  REQUESTING PHYSICIAN: Gale Journeyatherine P Walsh, MD  Chief Complaint: Left hip pain  HPI: Heidi SeenRebecca D Gjerde is a 81 y.o. female who complains of  left hip pain secondary to a trip and fall at home today. She was unable to ambulate and was brought to the emergency room. Exam and x-rays revealed an impacted left femoral neck fracture. She was admitted for medical evaluation and surgical stabilization. Risks and benefits of surgery were discussed with the patient at length. Since this is a nondisplaced fracture but have recommended percutaneous pinning. She will require protected weightbearing for several weeks after this.  Past Medical History  Diagnosis Date  . Hypertension   . Hypothyroidism   . GERD (gastroesophageal reflux disease)   . Peripheral neuropathy [facility](HCC)    Past Surgical History  Procedure Laterality Date  . Back surgery      spinal stenosis and back surgery  . Abdominal hysterectomy      due to cervical hyperlasia  . Cataract extraction Bilateral   . Upper gi endoscopy  10/17/03    normal duodenum, normal esophagus, normal stomach   Social History   Social History  . Marital Status: Married    Spouse Name: N/A  . Number of Children: N/A  . Years of Education: N/A   Social History Main Topics  . Smoking status: Never Smoker   . Smokeless tobacco: None  . Alcohol Use: No  . Drug Use: No  . Sexual Activity: Not Asked   Other Topics Concern  . None   Social History Narrative   Family History  Problem Relation Age of Onset  . Ovarian cancer Mother   . CVA Father   . Hypertension Sister   . Colon polyps Sister   . Heart disease Brother   . Colon polyps Brother    No Known Allergies Prior to Admission medications   Medication Sig Start Date End Date Taking? Authorizing Provider  aspirin 81 MG tablet Take by mouth. 03/21/11  Yes Historical Provider, MD  CALCIUM-VITAMIN D PO Take 600 mg by mouth 2 (two) times daily.  03/21/11  Yes  Historical Provider, MD  cholecalciferol (VITAMIN D) 1000 UNITS tablet Take 1,000 Units by mouth daily.  03/21/11  Yes Historical Provider, MD  Cyanocobalamin (VITAMIN B 12 PO) Take 1 tablet by mouth daily.   Yes Historical Provider, MD  hydrochlorothiazide (HYDRODIURIL) 25 MG tablet TAKE 1 TABLET DAILY 03/17/15  Yes Richard Hulen ShoutsL Gilbert Jr., MD  levothyroxine (SYNTHROID, LEVOTHROID) 50 MCG tablet Take 50 mcg by mouth daily before breakfast.  07/08/14  Yes Historical Provider, MD  MULTIPLE VITAMIN PO Take by mouth. 03/21/11  Yes Historical Provider, MD  omeprazole (PRILOSEC) 20 MG capsule Take 20 mg by mouth daily.  05/30/14  Yes Historical Provider, MD  oxybutynin (DITROPAN) 5 MG tablet Take 5 mg by mouth.  07/08/14  Yes Historical Provider, MD  simvastatin (ZOCOR) 20 MG tablet Take by mouth. 09/12/14  Yes Historical Provider, MD  loratadine (CLARITIN REDITABS) 10 MG dissolvable tablet Take 1 tablet (10 mg total) by mouth daily. Patient not taking: Reported on 04/03/2015 02/09/15   Maple Hudsonichard L Gilbert Jr., MD   Dg Hip Unilat With Pelvis 2-3 Views Left  04/03/2015  CLINICAL DATA:  81 year old female with history of trauma from a fall onto her left hip earlier today complaining of left-sided hip pain with movement. Unable to fully bear weight on the left side, and unable to walk or stand. EXAM: DG HIP (WITH OR  WITHOUT PELVIS) 2-3V LEFT COMPARISON:  No priors. FINDINGS: AP view of the bony pelvis demonstrates no acute displaced fracture of the bony pelvic ring. Right proximal femur as visualized appears intact. Two views of the left hip demonstrate a transcervical femoral neck fracture with slight medial displacement and likely a small amount of proximal migration of the distal fracture fragment. No significant angulation. The left femoral head remains located in the left acetabulum. IMPRESSION: 1. Minimally displaced nonangulated left femoral transcervical neck fracture, as above. Electronically Signed   By: Trudie Reed M.D.   On: 04/03/2015 13:01    Positive ROS: All other systems have been reviewed and were otherwise negative with the exception of those mentioned in the HPI and as above.  Physical Exam: General: Alert, no acute distress Cardiovascular: No pedal edema Respiratory: No cyanosis, no use of accessory musculature GI: No organomegaly, abdomen is soft and non-tender Skin: No lesions in the area of chief complaint Neurologic: Sensation intact distally Psychiatric: Patient is competent for consent with normal mood and affect Lymphatic: No axillary or cervical lymphadenopathy  MUSCULOSKELETAL: The patient is alert and cooperative. Pain is present with movement of the left hip. Leg lengths are equal. There is no significant swelling. Neurovascular status is normal. Skin is intact.  Assessment: Impacted left femoral neck fracture  Plan: Percutaneous pinning left hip in a.m. Replace Potassium tonight    Valinda Hoar, MD [phone]   04/03/2015 8:13 PM

## 2015-04-04 ENCOUNTER — Inpatient Hospital Stay: Payer: Medicare Other

## 2015-04-04 ENCOUNTER — Inpatient Hospital Stay: Payer: Medicare Other | Admitting: Anesthesiology

## 2015-04-04 ENCOUNTER — Encounter
Admission: EM | Disposition: A | Discharge status: Home Health | Payer: Self-pay | Source: Home / Self Care | Attending: Internal Medicine

## 2015-04-04 HISTORY — PX: HIP PINNING,CANNULATED: SHX1758

## 2015-04-04 LAB — CBC
HCT: 32.3 % — ABNORMAL LOW (ref 35.0–47.0)
HEMOGLOBIN: 11.3 g/dL — AB (ref 12.0–16.0)
MCH: 31.9 pg (ref 26.0–34.0)
MCHC: 34.9 g/dL (ref 32.0–36.0)
MCV: 91.4 fL (ref 80.0–100.0)
Platelets: 223 10*3/uL (ref 150–440)
RBC: 3.53 MIL/uL — AB (ref 3.80–5.20)
RDW: 12.8 % (ref 11.5–14.5)
WBC: 8.6 10*3/uL (ref 3.6–11.0)

## 2015-04-04 LAB — BASIC METABOLIC PANEL
Anion gap: 4 — ABNORMAL LOW (ref 5–15)
BUN: 17 mg/dL (ref 6–20)
CHLORIDE: 108 mmol/L (ref 101–111)
CO2: 27 mmol/L (ref 22–32)
Calcium: 8.9 mg/dL (ref 8.9–10.3)
Creatinine, Ser: 0.9 mg/dL (ref 0.44–1.00)
GFR calc non Af Amer: 58 mL/min — ABNORMAL LOW (ref >60)
Glucose, Bld: 100 mg/dL — ABNORMAL HIGH (ref 65–99)
POTASSIUM: 4.1 mmol/L (ref 3.5–5.1)
Sodium: 139 mmol/L (ref 135–145)

## 2015-04-04 LAB — MRSA PCR SCREENING: MRSA BY PCR: NEGATIVE

## 2015-04-04 SURGERY — FIXATION, FEMUR, NECK, PERCUTANEOUS, USING SCREW
Anesthesia: Spinal | Laterality: Left

## 2015-04-04 MED ORDER — METOCLOPRAMIDE HCL 5 MG/ML IJ SOLN: 5.0000 mg | Freq: Three times a day (TID) | INTRAMUSCULAR | Status: DC | PRN

## 2015-04-04 MED ORDER — SENNA 8.6 MG PO TABS
1.0000 | ORAL_TABLET | Freq: Two times a day (BID) | ORAL | Status: DC
  Administered 2015-04-04 – 2015-04-07 (×6): 8.6 mg via ORAL
  Filled 2015-04-04 (×6): qty 1

## 2015-04-04 MED ORDER — METOCLOPRAMIDE HCL 5 MG PO TABS: 5.0000 mg | ORAL_TABLET | Freq: Three times a day (TID) | ORAL | Status: DC | PRN

## 2015-04-04 MED ORDER — FERROUS SULFATE 325 (65 FE) MG PO TABS
325.0000 mg | ORAL_TABLET | Freq: Every day | ORAL | Status: DC
  Administered 2015-04-05 – 2015-04-07 (×3): 325 mg via ORAL
  Filled 2015-04-04 (×3): qty 1

## 2015-04-04 MED ORDER — MORPHINE SULFATE (PF) 2 MG/ML IV SOLN: 0.5000 mg | INTRAVENOUS | Status: DC | PRN

## 2015-04-04 MED ORDER — BUPIVACAINE-EPINEPHRINE (PF) 0.25% -1:200000 IJ SOLN
INTRAMUSCULAR | Status: AC
  Filled 2015-04-04: qty 30

## 2015-04-04 MED ORDER — PHENOL 1.4 % MT LIQD
1.0000 | OROMUCOSAL | Status: DC | PRN
  Filled 2015-04-04: qty 177

## 2015-04-04 MED ORDER — MENTHOL 3 MG MT LOZG
1.0000 | LOZENGE | OROMUCOSAL | Status: DC | PRN
  Filled 2015-04-04: qty 9

## 2015-04-04 MED ORDER — ONDANSETRON HCL 4 MG PO TABS
4.0000 mg | ORAL_TABLET | Freq: Four times a day (QID) | ORAL | Status: DC | PRN
  Administered 2015-04-07: 4 mg via ORAL
  Filled 2015-04-04: qty 1

## 2015-04-04 MED ORDER — ALUM & MAG HYDROXIDE-SIMETH 200-200-20 MG/5ML PO SUSP: 30.0000 mL | ORAL | Status: DC | PRN

## 2015-04-04 MED ORDER — EPINEPHRINE HCL 1 MG/ML IJ SOLN
INTRAMUSCULAR | Status: DC | PRN
  Administered 2015-04-04: 1 mg via INTRAVENOUS

## 2015-04-04 MED ORDER — BISACODYL 10 MG RE SUPP: 10.0000 mg | Freq: Every day | RECTAL | Status: DC | PRN

## 2015-04-04 MED ORDER — MAGNESIUM HYDROXIDE 400 MG/5ML PO SUSP: 30.0000 mL | Freq: Every day | ORAL | Status: DC | PRN

## 2015-04-04 MED ORDER — BUPIVACAINE-EPINEPHRINE (PF) 0.5% -1:200000 IJ SOLN
INTRAMUSCULAR | Status: AC
  Filled 2015-04-04: qty 30

## 2015-04-04 MED ORDER — BUPIVACAINE-EPINEPHRINE 0.25% -1:200000 IJ SOLN
INTRAMUSCULAR | Status: DC | PRN
  Administered 2015-04-04: 30 mL

## 2015-04-04 MED ORDER — EPHEDRINE SULFATE 50 MG/ML IJ SOLN
INTRAMUSCULAR | Status: DC | PRN
  Administered 2015-04-04: 10 mg via INTRAVENOUS

## 2015-04-04 MED ORDER — SODIUM CHLORIDE 0.45 % IV SOLN
INTRAVENOUS | Status: DC
  Administered 2015-04-04: via INTRAVENOUS

## 2015-04-04 MED ORDER — FENTANYL CITRATE (PF) 100 MCG/2ML IJ SOLN
INTRAMUSCULAR | Status: DC | PRN
  Administered 2015-04-04 (×2): 50 ug via INTRAVENOUS

## 2015-04-04 MED ORDER — ACETAMINOPHEN 650 MG RE SUPP: 650.0000 mg | Freq: Four times a day (QID) | RECTAL | Status: DC | PRN

## 2015-04-04 MED ORDER — ONDANSETRON HCL 4 MG/2ML IJ SOLN: 4.0000 mg | Freq: Once | INTRAMUSCULAR | Status: DC | PRN

## 2015-04-04 MED ORDER — ZOLPIDEM TARTRATE 5 MG PO TABS
5.0000 mg | ORAL_TABLET | Freq: Every evening | ORAL | Status: DC | PRN
  Administered 2015-04-05: 5 mg via ORAL
  Filled 2015-04-04: qty 1

## 2015-04-04 MED ORDER — ENOXAPARIN SODIUM 30 MG/0.3ML ~~LOC~~ SOLN
30.0000 mg | SUBCUTANEOUS | Status: DC
  Administered 2015-04-05 – 2015-04-06 (×2): 30 mg via SUBCUTANEOUS
  Filled 2015-04-04 (×2): qty 0.3

## 2015-04-04 MED ORDER — ONDANSETRON HCL 4 MG/2ML IJ SOLN: 4.0000 mg | Freq: Four times a day (QID) | INTRAMUSCULAR | Status: DC | PRN

## 2015-04-04 MED ORDER — CEFAZOLIN SODIUM-DEXTROSE 2-3 GM-% IV SOLR
2.0000 g | Freq: Four times a day (QID) | INTRAVENOUS | Status: AC
  Administered 2015-04-04 (×2): 2 g via INTRAVENOUS
  Filled 2015-04-04 (×2): qty 50

## 2015-04-04 MED ORDER — HYDROCODONE-ACETAMINOPHEN 5-325 MG PO TABS
1.0000 | ORAL_TABLET | Freq: Four times a day (QID) | ORAL | Status: DC | PRN
  Administered 2015-04-04 – 2015-04-07 (×5): 1 via ORAL
  Filled 2015-04-04 (×5): qty 1

## 2015-04-04 MED ORDER — FENTANYL CITRATE (PF) 100 MCG/2ML IJ SOLN: 25.0000 ug | INTRAMUSCULAR | Status: DC | PRN

## 2015-04-04 MED ORDER — FLEET ENEMA 7-19 GM/118ML RE ENEM: 1.0000 | ENEMA | Freq: Once | RECTAL | Status: DC | PRN

## 2015-04-04 MED ORDER — ACETAMINOPHEN 325 MG PO TABS
650.0000 mg | ORAL_TABLET | Freq: Four times a day (QID) | ORAL | Status: DC | PRN
  Administered 2015-04-06: 650 mg via ORAL
  Filled 2015-04-04: qty 2

## 2015-04-04 MED ORDER — MIDAZOLAM HCL 2 MG/2ML IJ SOLN
INTRAMUSCULAR | Status: DC | PRN
  Administered 2015-04-04 (×2): 1 mg via INTRAVENOUS

## 2015-04-04 MED ORDER — ACETAMINOPHEN 500 MG PO TABS
1000.0000 mg | ORAL_TABLET | Freq: Four times a day (QID) | ORAL | Status: AC
  Administered 2015-04-04 – 2015-04-05 (×2): 1000 mg via ORAL
  Filled 2015-04-04 (×2): qty 2

## 2015-04-04 SURGICAL SUPPLY — 29 items
BAG COUNTER SPONGE EZ (MISCELLANEOUS) ×2 IMPLANT
BAG SPNG 4X4 CLR HAZ (MISCELLANEOUS) ×1
BIT DRILL CANN LRG QC 5X300 (BIT) ×3 IMPLANT
BLADE SURG SZ11 CARB STEEL (BLADE) ×3 IMPLANT
BNDG COHESIVE 4X5 TAN STRL (GAUZE/BANDAGES/DRESSINGS) ×3 IMPLANT
CHLORAPREP W/TINT 26ML (MISCELLANEOUS) ×3 IMPLANT
COUNTER SPONGE BAG EZ (MISCELLANEOUS) ×1
DRSG AQUACEL AG ADV 3.5X10 (GAUZE/BANDAGES/DRESSINGS) IMPLANT
DRSG OPSITE POSTOP 4X8 (GAUZE/BANDAGES/DRESSINGS) ×6 IMPLANT
GAUZE SPONGE 4X4 12PLY STRL (GAUZE/BANDAGES/DRESSINGS) IMPLANT
GLOVE BIO SURGEON STRL SZ8 (GLOVE) ×3 IMPLANT
GLOVE SURG ORTHO 8.5 STRL (GLOVE) ×3 IMPLANT
GLOVE SURG XRAY 8.5 LX (GLOVE) ×9 IMPLANT
GOWN STRL REUS W/ TWL LRG LVL3 (GOWN DISPOSABLE) ×2 IMPLANT
GOWN STRL REUS W/TWL LRG LVL3 (GOWN DISPOSABLE) ×6
GUIDEWIRE THREADED 2.8 (WIRE) ×9 IMPLANT
KIT RM TURNOVER STRD PROC AR (KITS) ×3 IMPLANT
MAT BLUE FLOOR 46X72 FLO (MISCELLANEOUS) ×3 IMPLANT
NEEDLE SPNL 18GX3.5 QUINCKE PK (NEEDLE) ×3 IMPLANT
NS IRRIG 500ML POUR BTL (IV SOLUTION) ×3 IMPLANT
PACK HIP COMPR (MISCELLANEOUS) ×3 IMPLANT
SCREW CANN 32 THRD/70 7.3 (Screw) ×6 IMPLANT
SCREW CANN 32 THRD/75 7.3 (Screw) ×3 IMPLANT
SCREW CANN 32 THRD/80 7.3 (Screw) ×3 IMPLANT
SCREW LOCK (Screw) ×6 IMPLANT
SCREW LOCK FT 75X4.5XSLD ST NS (Screw) ×3 IMPLANT
STRAP SAFETY BODY (MISCELLANEOUS) ×3 IMPLANT
SUT ETHILON 3 0 FSLX (SUTURE) ×3 IMPLANT
SYR 30ML LL (SYRINGE) ×3 IMPLANT

## 2015-04-04 NOTE — OR Nursing (Signed)
[facility]

## 2015-04-04 NOTE — OR Nursing (Signed)
Patient has spinal anesthesia in place level at t10

## 2015-04-04 NOTE — Anesthesia Procedure Notes (Signed)
Spinal Patient location during procedure: OR Start time: 04/04/2015 8:08 AM End time: 04/04/2015 8:21 AM Preanesthetic Checklist Completed: patient identified, site marked, surgical consent, pre-op evaluation, timeout performed, IV checked, risks and benefits discussed and monitors and equipment checked Spinal Block Patient position: sitting Prep: Betadine Patient monitoring: heart rate, continuous pulse ox, blood pressure and cardiac monitor Approach: midline Location: L4-5 Injection technique: single-shot Needle Needle type: Whitacre and Introducer  Needle gauge: 24 G Needle length: 9 cm Needle insertion depth: 6 cm Additional Notes Negative paresthesia. Negative blood return. Positive free-flowing CSF. Expiration date of kit checked and confirmed. Patient tolerated procedure well, without complications.

## 2015-04-04 NOTE — Transfer of Care (Signed)
Immediate Anesthesia Transfer of Care Note  Patient: Heidi Yoder  Procedure(s) Performed: Procedure(s): CANNULATED HIP PINNING (Left)  Patient Location: PACU  Anesthesia Type:Spinal  Level of Consciousness: awake  Airway & Oxygen Therapy: Patient Spontanous Breathing  Post-op Assessment: Report given to RN  Post vital signs: Reviewed  Last Vitals:  Filed Vitals:   04/03/15 1931 04/04/15 0401  BP: 131/66 118/54  Pulse: 70 52  Temp: 36.9 C 36.8 C  Resp: 18 18    Complications: No apparent anesthesia complications

## 2015-04-04 NOTE — Anesthesia Preprocedure Evaluation (Signed)
Anesthesia Evaluation  Patient identified by MRN, date of birth, ID band Patient awake    Reviewed: Allergy & Precautions, NPO status , Patient's Chart, lab work & pertinent test results  History of Anesthesia Complications (+) PONV and history of anesthetic complications  Airway Mallampati: II       Dental  (+) Teeth Intact   Pulmonary neg pulmonary ROS, COPD: pt denies.,           Cardiovascular hypertension, Pt. on medications      Neuro/Psych  Neuromuscular disease (peripheral neuropathy)    GI/Hepatic Neg liver ROS, GERD  Medicated and Controlled,  Endo/Other  Hypothyroidism   Renal/GU negative Renal ROS     Musculoskeletal   Abdominal   Peds  Hematology   Anesthesia Other Findings   Reproductive/Obstetrics                             Anesthesia Physical Anesthesia Plan  ASA: III  Anesthesia Plan: Spinal   Post-op Pain Management:    Induction: Intravenous  Airway Management Planned: Nasal Cannula  Additional Equipment:   Intra-op Plan:   Post-operative Plan:   Informed Consent: I have reviewed the patients History and Physical, chart, labs and discussed the procedure including the risks, benefits and alternatives for the proposed anesthesia with the patient or authorized representative who has indicated his/her understanding and acceptance.     Plan Discussed with:   Anesthesia Plan Comments:         Anesthesia Quick Evaluation

## 2015-04-04 NOTE — Op Note (Signed)
04/03/2015 - 04/04/2015  9:12 AM  PATIENT:  Heidi Yoder    PRE-OPERATIVE DIAGNOSIS:  left femoral neck impacted hip fx  POST-OPERATIVE DIAGNOSIS:  Same  PROCEDURE:  CANNULATED HIP PINNING LEFT HIP (4  7.3MM SCREWS)  SURGEON:  Valinda HoarMILLER,Willmer Fellers E, MD   ANESTHESIA:   Spinal  PREOPERATIVE INDICATIONS:  Heidi Yoder is a  81 y.o. female who fell and was found to have a diagnosis of left hip fx who elected for surgical management.    The risks benefits and alternatives were discussed with the patient preoperatively including but not limited to the risks of infection, bleeding, nerve injury, cardiopulmonary complications, blood clots, malunion, nonunion, avascular necrosis, the need for revision surgery, the potential for conversion to hemiarthroplasty, among others, and the patient was willing to proceed.  OPERATIVE IMPLANTS: 7.3 mm cannulated screws x4  OPERATIVE FINDINGS: Clinical osteoporosis with weak bone, proximal femur  OPERATIVE PROCEDURE: The patient was brought to the operating room and  IV antibiotics were given. Spinal anesthesia administered.  The patient was placed on the fracture table. The operative extremity was positioned, without any significant reduction maneuver and was prepped and draped in usual sterile fashion.  Time out was performed.  Small incision was made distal to the greater trochanter, and 4 guidewires were introduced into the head and neck. The lengths were measured. The reduction was slightly valgus, and near-anatomic. I opened the cortex with a cannulated drill, and then placed the screws into position. Satisfactory fixation was achieved. Fluoroscopy showed good position of the screws and fracture.   The wounds were irrigated copiously, and repaired with  3-O Nylon with and sterile gauze. Sponge and needle count were correct.  There no complications and the patient tolerated the procedure well.  The patient will be touch down weightbearing as  tolerated, and will be on Lovenox  for DVT prophylaxis.   Valinda HoarHoward E Dwanna Goshert, MD

## 2015-04-04 NOTE — Progress Notes (Signed)
[facility] [place] [facility]   PATIENT NAME: Heidi GougeRebecca Ocon    MR#:  [id number]  DATE OF BIRTH:  1929-09-17  SUBJECTIVE:  CHIEF COMPLAINT:   Chief Complaint  Patient presents with  . Fall    S/p hip surgery 04/04/15  REVIEW OF SYSTEMS:  CONSTITUTIONAL: No fever, fatigue or weakness.  EYES: No blurred or double vision.  EARS, NOSE, AND THROAT: No tinnitus or ear pain.  RESPIRATORY: No cough, shortness of breath, wheezing or hemoptysis.  CARDIOVASCULAR: No chest pain, orthopnea, edema.  GASTROINTESTINAL: No nausea, vomiting, diarrhea or abdominal pain.  GENITOURINARY: No dysuria, hematuria.  ENDOCRINE: No polyuria, nocturia,  HEMATOLOGY: No anemia, easy bruising or bleeding SKIN: No rash or lesion. MUSCULOSKELETAL: left hip joint pain or arthritis.   NEUROLOGIC: No tingling, numbness, weakness.  PSYCHIATRY: No anxiety or depression.   ROS  DRUG ALLERGIES:  No Known Allergies  VITALS:  Blood pressure 111/69, pulse 67, temperature 98.3 F (36.8 C), temperature source Oral, resp. rate 18, height 5\' 1"  (1.549 m), weight 63.05 kg (139 lb), SpO2 93 %.  PHYSICAL EXAMINATION:  GENERAL:  81 y.o.-year-old patient lying in the bed with no acute distress.  EYES: Pupils equal, round, reactive to light and accommodation. No scleral icterus. Extraocular muscles intact.  HEENT: Head atraumatic, normocephalic. Oropharynx and nasopharynx clear.  NECK:  Supple, no jugular venous distention. No thyroid enlargement, no tenderness.  LUNGS: Normal breath sounds bilaterally, no wheezing, rales,rhonchi or crepitation. No use of accessory muscles of respiration.  CARDIOVASCULAR: S1, S2 normal. No murmurs, rubs, or gallops.  ABDOMEN: Soft, nontender, nondistended. Bowel sounds present. No organomegaly or mass.  EXTREMITIES: No pedal edema, cyanosis, or clubbing. Left hip joint pain. NEUROLOGIC: Cranial nerves II through XII are intact. Muscle strength 5/5 in all  extremities. Sensation intact. Gait not checked.  PSYCHIATRIC: The patient is alert and oriented x 3.  SKIN: No obvious rash, lesion, or ulcer.   Physical Exam LABORATORY PANEL:   CBC  Recent Labs Lab 04/04/15 0418  WBC 8.6  HGB 11.3*  HCT 32.3*  PLT 223   ------------------------------------------------------------------------------------------------------------------  Chemistries   Recent Labs Lab 04/03/15 1618 04/04/15 0418  NA 142 139  K 3.3* 4.1  CL 104 108  CO2 27 27  GLUCOSE 89 100*  BUN 20 17  CREATININE 0.83 0.90  CALCIUM 9.7 8.9  AST 24  --   ALT 20  --   ALKPHOS 50  --   BILITOT 0.5  --    ------------------------------------------------------------------------------------------------------------------  Cardiac Enzymes No results for input(s): TROPONINI in the last 168 hours. ------------------------------------------------------------------------------------------------------------------  RADIOLOGY:  Dg Hip Operative Unilat With Pelvis Left  04/04/2015  CLINICAL DATA:  Hip fracture EXAM: OPERATIVE LEFT HIP (WITH PELVIS IF PERFORMED) 2 VIEWS TECHNIQUE: Fluoroscopic spot image(s) were submitted for interpretation post-operatively. COMPARISON:  04/03/2015 FINDINGS: Three cannulated screws span the femoral neck. No fracture or dislocation. IMPRESSION: No complication following internal fixation of LEFT femoral neck fracture. Electronically Signed   By: Genevive BiStewart  Edmunds M.D.   On: 04/04/2015 09:40   Dg Hip Unilat With Pelvis 2-3 Views Left  04/03/2015  CLINICAL DATA:  81 year old female with history of trauma from a fall onto her left hip earlier today complaining of left-sided hip pain with movement. Unable to fully bear weight on the left side, and unable to walk or stand. EXAM: DG HIP (WITH OR WITHOUT PELVIS) 2-3V LEFT COMPARISON:  No priors. FINDINGS: AP view of the bony pelvis demonstrates no  acute displaced fracture of the bony pelvic ring. Right  proximal femur as visualized appears intact. Two views of the left hip demonstrate a transcervical femoral neck fracture with slight medial displacement and likely a small amount of proximal migration of the distal fracture fragment. No significant angulation. The left femoral head remains located in the left acetabulum. IMPRESSION: 1. Minimally displaced nonangulated left femoral transcervical neck fracture, as above. Electronically Signed   By: Trudie Reed M.D.   On: 04/03/2015 13:01    ASSESSMENT AND PLAN:   Active Problems:   Closed left hip fracture (HCC)  1. Left femoral neck fracture:  s/p surgery 03/1015.  DVT prophylaxis, physical therapy, pain control.  2. Hypertension -  Continue hydrochlorothiazide  3. Hypothyroidism -Continue Synthroid  4. Bilateral lower extremity peripheral neuropathy - She states this has been present for years and is idiopathic  check a B12 level as well as a TSH. This may be contributing to falls and lack of balance.   physical therapy evaluation prior to discharge  5. Hyperlipidemia - Continue statin  All the records are reviewed and case discussed with Care Management/Social Workerr. Management plans discussed with the patient, family and they are in agreement.  CODE STATUS: full  TOTAL TIME TAKING CARE OF THIS PATIENT: 35 minutes.     POSSIBLE D/C IN 2-3 DAYS, DEPENDING ON CLINICAL CONDITION.   Altamese Dilling M.D on 04/04/2015   Between 7am to 6pm - Pager - [phone]  After 6pm go to [email or web address] - password [email or web address] [facility]  [place] [facility]  Office  [phone]  CC: Primary care physician; Megan Mans, MD  Note: This dictation was prepared with Dragon dictation along with smaller phrase technology. Any transcriptional errors that result from this process are unintentional.

## 2015-04-05 LAB — CBC
HEMATOCRIT: 31.7 % — AB (ref 35.0–47.0)
HEMOGLOBIN: 11 g/dL — AB (ref 12.0–16.0)
MCH: 31.9 pg (ref 26.0–34.0)
MCHC: 34.7 g/dL (ref 32.0–36.0)
MCV: 92 fL (ref 80.0–100.0)
Platelets: 205 10*3/uL (ref 150–440)
RBC: 3.45 MIL/uL — ABNORMAL LOW (ref 3.80–5.20)
RDW: 12.7 % (ref 11.5–14.5)
WBC: 9 10*3/uL (ref 3.6–11.0)

## 2015-04-05 LAB — BASIC METABOLIC PANEL
Anion gap: 7 (ref 5–15)
BUN: 15 mg/dL (ref 6–20)
CHLORIDE: 107 mmol/L (ref 101–111)
CO2: 26 mmol/L (ref 22–32)
CREATININE: 1.1 mg/dL — AB (ref 0.44–1.00)
Calcium: 8.6 mg/dL — ABNORMAL LOW (ref 8.9–10.3)
GFR calc Af Amer: 53 mL/min — ABNORMAL LOW (ref >60)
GFR calc non Af Amer: 45 mL/min — ABNORMAL LOW (ref >60)
Glucose, Bld: 108 mg/dL — ABNORMAL HIGH (ref 65–99)
Potassium: 3.8 mmol/L (ref 3.5–5.1)
Sodium: 140 mmol/L (ref 135–145)

## 2015-04-05 NOTE — Progress Notes (Signed)
[facility] [place] [facility]   PATIENT NAME: Heidi GougeRebecca Milbourne    MR#:  [id number]  DATE OF BIRTH:  [birth year]  SUBJECTIVE:  CHIEF COMPLAINT:   Chief Complaint  Patient presents with  . Fall    S/p hip surgery 04/04/15- walked today with PT, feels much better- som pain in hip.  REVIEW OF SYSTEMS:  CONSTITUTIONAL: No fever, fatigue or weakness.  EYES: No blurred or double vision.  EARS, NOSE, AND THROAT: No tinnitus or ear pain.  RESPIRATORY: No cough, shortness of breath, wheezing or hemoptysis.  CARDIOVASCULAR: No chest pain, orthopnea, edema.  GASTROINTESTINAL: No nausea, vomiting, diarrhea or abdominal pain.  GENITOURINARY: No dysuria, hematuria.  ENDOCRINE: No polyuria, nocturia,  HEMATOLOGY: No anemia, easy bruising or bleeding SKIN: No rash or lesion. MUSCULOSKELETAL: left hip joint pain or arthritis.   NEUROLOGIC: No tingling, numbness, weakness.  PSYCHIATRY: No anxiety or depression.   ROS  DRUG ALLERGIES:  No Known Allergies  VITALS:  Blood pressure 130/68, pulse 61, temperature 97.9 F (36.6 C), temperature source Oral, resp. rate 16, height 5\' 1"  (1.549 m), weight 63.05 kg (139 lb), SpO2 97 %.  PHYSICAL EXAMINATION:  GENERAL:  81 y.o.-year-old patient lying in the bed with no acute distress.  EYES: Pupils equal, round, reactive to light and accommodation. No scleral icterus. Extraocular muscles intact.  HEENT: Head atraumatic, normocephalic. Oropharynx and nasopharynx clear.  NECK:  Supple, no jugular venous distention. No thyroid enlargement, no tenderness.  LUNGS: Normal breath sounds bilaterally, no wheezing, rales,rhonchi or crepitation. No use of accessory muscles of respiration.  CARDIOVASCULAR: S1, S2 normal. No murmurs, rubs, or gallops.  ABDOMEN: Soft, nontender, nondistended. Bowel sounds present. No organomegaly or mass.  EXTREMITIES: No pedal edema, cyanosis, or clubbing. Left hip joint pain. NEUROLOGIC: Cranial  nerves II through XII are intact. Muscle strength 5/5 in all extremities. Sensation intact. Gait not checked.  PSYCHIATRIC: The patient is alert and oriented x 3.  SKIN: No obvious rash, lesion, or ulcer.   Physical Exam LABORATORY PANEL:   CBC  Recent Labs Lab 04/05/15 0401  WBC 9.0  HGB 11.0*  HCT 31.7*  PLT 205   ------------------------------------------------------------------------------------------------------------------  Chemistries   Recent Labs Lab 04/03/15 1618  04/05/15 0401  NA 142  < > 140  K 3.3*  < > 3.8  CL 104  < > 107  CO2 27  < > 26  GLUCOSE 89  < > 108*  BUN 20  < > 15  CREATININE 0.83  < > 1.10*  CALCIUM 9.7  < > 8.6*  AST 24  --   --   ALT 20  --   --   ALKPHOS 50  --   --   BILITOT 0.5  --   --   < > = values in this interval not displayed. ------------------------------------------------------------------------------------------------------------------  Cardiac Enzymes No results for input(s): TROPONINI in the last 168 hours. ------------------------------------------------------------------------------------------------------------------  RADIOLOGY:  Dg Hip Operative Unilat With Pelvis Left  04/04/2015  CLINICAL DATA:  Hip fracture EXAM: OPERATIVE LEFT HIP (WITH PELVIS IF PERFORMED) 2 VIEWS TECHNIQUE: Fluoroscopic spot image(s) were submitted for interpretation post-operatively. COMPARISON:  04/03/2015 FINDINGS: Three cannulated screws span the femoral neck. No fracture or dislocation. IMPRESSION: No complication following internal fixation of LEFT femoral neck fracture. Electronically Signed   By: Genevive BiStewart  Edmunds M.D.   On: 04/04/2015 09:40    ASSESSMENT AND PLAN:   Active Problems:   Closed left hip fracture (HCC)  1.  Left femoral neck fracture:  s/p surgery 03/1015.  DVT prophylaxis, physical therapy, pain control.   Started PT- and she was able to walk today.  2. Hypertension -  Continue hydrochlorothiazide  3.  Hypothyroidism -Continue Synthroid  4. Bilateral lower extremity peripheral neuropathy - She states this has been present for years and is idiopathic   TSH in range Vit B12- in process.  physical therapy evaluation prior to discharge  5. Hyperlipidemia - Continue statin  6. Hypokalemia   On admission- replaced- stable now.  All the records are reviewed and case discussed with Care Management/Social Workerr. Management plans discussed with the patient, family and they are in agreement.  CODE STATUS: full  TOTAL TIME TAKING CARE OF THIS PATIENT: 35 minutes.    POSSIBLE D/C IN 2-3 DAYS, DEPENDING ON CLINICAL CONDITION.   Altamese Dilling M.D on 04/05/2015   Between 7am to 6pm - Pager - [phone]  After 6pm go to [email or web address] - password [email or web address] [facility]  [place] [facility]  Office  [phone]  CC: Primary care physician; Megan Mans, MD  Note: This dictation was prepared with Dragon dictation along with smaller phrase technology. Any transcriptional errors that result from this process are unintentional.

## 2015-04-05 NOTE — Evaluation (Signed)
Occupational Therapy Evaluation Patient Details Name: Heidi Yoder MRN: [id number] DOB: [birth year] Today's Date: 04/05/2015    History of Present Illness Heidi GougeRebecca Yoder is a 81 y.o. female with a known history of bilateral lower extremity peripheral neuropathy, hypothyroidism, hypertension and GERD presents today after mechanical fall and is found to have left femoral neck fracture. She reports that she was on the phone this morning and after hanging up she turned to walk to the door and tripped over the cord to her husband's electronic chair. She fell on her left side and then had to slide across the floor so she could pull up. She then had to walk to the front door to get her husband's attention. She had significant pain in the hip and leg and so was brought to the emergency room. X-ray shows minimally displaced left femoral transcervical neck fracture. Pt has L hip nailing/pinning on 04/04/15 with partial WB.    Clinical Impression   Pt is 81 year old female s/p L hip nailing/pinning with partial WB who lives at home with her husband who needs a lot of assistance from her due to macular degeneration. Pt was independent in all ADLs, driving and doing all household activities including meal prep, cleaning and setting up meds for her husband prior to surgery and is eager to return to PLOF.  Pt is currently limited in functional ADLs due to pain and decreased ROM.  Pt requires minimal assist with mod cues for LB dressing and bathing skills with use of AD due to pain and decreased AROM of L LE. She would benefit from continued skilled OT services for education in assistive devices, functional mobility, and education in recommendations for home modifications to increase safety and prevent falls.  Pt is a good candidate for SNF to continue rehabilitation, but would [facility] due to her husband needing so much help from her.       Follow Up Recommendations  Home health  OT (may need SNF if she is not able to complete ADLs with AD independently)    Equipment Recommendations  Tub/shower seat;3 in 1 bedside comode (rec BSC over toilet and shower chair with back)    Recommendations for Other Services       Precautions / Restrictions Precautions Precautions: Fall Restrictions Weight Bearing Restrictions: Yes LLE Weight Bearing: Partial weight bearing      Mobility Bed Mobility                  Transfers                      Balance                                            ADL Overall ADL's : Needs assistance/impaired                                       General ADL Comments: Pt instructed in use of reacher and sock aid while sitting up in chair and required minimal assist and mod cues.  She is able to complete UB dressing and bathing and grooming skills independent after set up.  She would benefit from further practice with reacher and sock aid for LB dressing.  Vision     Perception     Praxis      Pertinent Vitals/Pain Pain Assessment: 0-10 Pain Score: 4  Pain Location: L hip Pain Descriptors / Indicators: Aching Pain Intervention(s): Limited activity within patient's tolerance;Monitored during session;Ice applied;Repositioned     Hand Dominance Right   Extremity/Trunk Assessment Upper Extremity Assessment Upper Extremity Assessment: Overall WFL for tasks assessed   Lower Extremity Assessment Lower Extremity Assessment: Defer to PT evaluation       Communication Communication Communication: No difficulties   Cognition Arousal/Alertness: Awake/alert Behavior During Therapy: WFL for tasks assessed/performed Overall Cognitive Status: Within Functional Limits for tasks assessed                     General Comments       Exercises       Shoulder Instructions      Home Living Family/patient expects to be discharged to:: Private residence (pt would  like to go home but is open to going to rehab at SNF if needed.) Living Arrangements: Spouse/significant other Available Help at Discharge: Family;Friend(s) Type of Home: House             Bathroom Shower/Tub: Tub/shower unit;Curtain Shower/tub characteristics: Engineer, building services: Handicapped height Bathroom Accessibility: Yes   Home Equipment: Environmental consultant - 2 wheels;Grab bars - tub/shower   Additional Comments: rec shower chair with back and BSC over toilet       Prior Functioning/Environment Level of Independence: Independent        Comments: Pt was indepdendent in ADLs, driving and taking care of her husband who has macular degeneration and needs help with med set up, meals, etc.    OT Diagnosis: Generalized weakness;Acute pain   OT Problem List: Decreased strength;Decreased range of motion;Decreased activity tolerance;Pain   OT Treatment/Interventions: Self-care/ADL training;DME and/or AE instruction;Patient/family education    OT Goals(Current goals can be found in the care plan section) Acute Rehab OT Goals Patient Stated Goal: "I want to go home if possible because my husband needs so much hellp from me" OT Goal Formulation: With patient Time For Goal Achievement: 04/19/15 Potential to Achieve Goals: Good  OT Frequency: Min 1X/week   Barriers to D/C:    pt takes care of her husband and needs to be independent in ADLs before returning home       Co-evaluation              End of Session Equipment Utilized During Treatment:  (reacher and sock aid) Nurse Communication:  (RN cleared her off bed rest to participate in therapy)  Activity Tolerance: Patient tolerated treatment well Patient left: in chair;with call bell/phone within reach;with chair alarm set;with family/visitor present;with SCD's reapplied (grand daughter arrived at end of session)   Time: 0920-0945 OT Time Calculation (min): 25 min Charges:  OT General Charges $OT Visit: 1 Procedure OT  Evaluation $Initial OT Evaluation Tier I: 1 Procedure OT Treatments $Self Care/Home Management : 8-22 mins G-Codes:    Heidi Yoder April 08, 2015, 11:21 AM    Heidi Yoder, OTR/L ascom [phone]

## 2015-04-05 NOTE — Progress Notes (Signed)
Subjective: 1 Day Post-Op Procedure(s) (LRB): CANNULATED HIP PINNING (Left)    Patient reports pain as mild. oob in chair.  Walked with PT. Still wants to go home..  Objective:   VITALS:   Filed Vitals:   04/05/15 0428 04/05/15 0733  BP: 122/60 128/79  Pulse: 64 58  Temp: 98.3 F (36.8 C) 98.2 F (36.8 C)  Resp: 18 16    Neurovascular intact Sensation intact distally Intact pulses distally Dorsiflexion/Plantar flexion intact Incision: scant drainage  LABS  Recent Labs  04/03/15 1618 04/04/15 0418 04/05/15 0401  HGB 11.7* 11.3* 11.0*  HCT 35.8 32.3* 31.7*  WBC 14.3* 8.6 9.0  PLT 257 223 205     Recent Labs  04/03/15 1618 04/04/15 0418 04/05/15 0401  NA 142 139 140  K 3.3* 4.1 3.8  BUN 20 17 15   CREATININE 0.83 0.90 1.10*  GLUCOSE 89 100* 108*     Recent Labs  04/03/15 1618  INR 1.06     Assessment/Plan: 1 Day Post-Op Procedure(s) (LRB): CANNULATED HIP PINNING (Left)   Advance diet Up with therapy D/C IV fluids Discharge home with home health when stable.

## 2015-04-05 NOTE — Clinical Documentation Improvement (Signed)
Orthopedic  Please document reason for administration of Potassium chloride 20 mEq PO on 04/03/15?       Hypokalemia    Hyperkalemia    Other condition __________  Supporting Information: --Consult Note of Dr. Deeann SaintHoward Miller, "Replace Potassium tonight."   --Potassium chloride 20 mEq PO ordered by Dr. Hyacinth MeekerMiller on 04/03/15 at 2016.   Please update your documentation within the medical record to reflect your response to this query.   Please exercise your independent, professional judgment when responding. A specific answer is not anticipated or expected.   Thank You,  Darla LeschesWendy Dabria Wadas, RN, BSN, CCRN Clinical Documentation Improvement Specialist HIM department--[place] [phone]

## 2015-04-05 NOTE — Evaluation (Signed)
Physical Therapy Evaluation Patient Details Name: Heidi SeenRebecca D Yoder MRN: [id number] DOB: [birth year] Today's Date: 04/05/2015   History of Present Illness  Heidi GougeRebecca Yoder is a 81 y.o. female with a known history of bilateral lower extremity peripheral neuropathy, hypothyroidism, hypertension and GERD presents today after mechanical fall and is found to have left femoral neck fracture. She reports that she was on the phone this morning and after hanging up she turned to walk to the door and tripped over the cord to her husband's electronic chair. She fell on her left side and then had to slide across the floor so she could pull up. She then had to walk to the front door to get her husband's attention. She had significant pain in the hip and leg and so was brought to the emergency room. X-ray shows minimally displaced left femoral transcervical neck fracture. Pt has L hip nailing/pinning on 04/04/15 with partial WB.   Clinical Impression  Pt does very well with POD1 PT session and though she has expected pain and hesitancy she shows good motivation and confidence with all acts and was able to do all mobility w/o direct physical assist and was able to ambulate into the hallway with slow but consistent and safe cadence.      Follow Up Recommendations Home health PT    Equipment Recommendations  Rolling walker with 5" wheels (may be able to use husband's?)    Recommendations for Other Services       Precautions / Restrictions Precautions Precautions: Fall Restrictions Weight Bearing Restrictions: Yes LLE Weight Bearing: Partial weight bearing      Mobility  Bed Mobility Overal bed mobility: Modified Independent             General bed mobility comments: Pt is slow getting to EOB but is able to get to sitting w/o needing direct assist. Pt left in recliner, sit to supine not tested.  Transfers Overall transfer level: Modified independent Equipment used: Rolling walker (2  wheeled)             General transfer comment: Pt shows good confidence in getting to standing and not needing direct assist.  She is able to maintain balance and ultiamtely shows good confidence and safety for a POD1 effort  Ambulation/Gait Ambulation/Gait assistance: Min guard Ambulation Distance (Feet): 50 Feet Assistive device: Rolling walker (2 wheeled)       General Gait Details: Pt with slow, but consistent ambulation and though she has some hesitancy she is safe and does not have any LOBs.   Stairs            Wheelchair Mobility    Modified Rankin (Stroke Patients Only)       Balance                                             Pertinent Vitals/Pain Pain Assessment: 0-10 Pain Score: 5  Pain Location: L hip Pain Descriptors / Indicators: Aching Pain Intervention(s): Limited activity within patient's tolerance;Monitored during session;Ice applied;Repositioned    Home Living Family/patient expects to be discharged to:: Private residence Living Arrangements: Spouse/significant other Available Help at Discharge: Family;Friend(s) Type of Home: House Home Access: Stairs to enter Entrance Stairs-Rails: Can reach both Entrance Stairs-Number of Steps: 3   Home Equipment: Grab bars - tub/shower (husband's walker, may need her own?) Additional Comments: rec shower chair with back  and BSC over toilet     Prior Function Level of Independence: Independent         Comments: Pt was indepdendent in ADLs, driving and taking care of her husband who has macular degeneration and needs help with med set up, meals, etc.     Hand Dominance   Dominant Hand: Right    Extremity/Trunk Assessment   Upper Extremity Assessment: Overall WFL for tasks assessed           Lower Extremity Assessment: LLE deficits/detail (grossly 3+ to 4-/5)         Communication   Communication: No difficulties  Cognition Arousal/Alertness:  Awake/alert Behavior During Therapy: WFL for tasks assessed/performed Overall Cognitive Status: Within Functional Limits for tasks assessed                      General Comments      Exercises General Exercises - Lower Extremity Ankle Circles/Pumps: AROM;10 reps Quad Sets: Strengthening;10 reps Gluteal Sets: Strengthening;10 reps Heel Slides: AROM;10 reps Hip ABduction/ADduction: AROM;10 reps Straight Leg Raises: AAROM;5 reps      Assessment/Plan    PT Assessment Patient needs continued PT services  PT Diagnosis Difficulty walking;Generalized weakness   PT Problem List Decreased strength;Decreased range of motion;Decreased activity tolerance;Decreased balance;Decreased mobility;Decreased coordination;Decreased cognition;Decreased knowledge of use of DME;Decreased safety awareness  PT Treatment Interventions DME instruction;Gait training;Stair training;Functional mobility training;Therapeutic activities;Therapeutic exercise;Balance training;Patient/family education   PT Goals (Current goals can be found in the Care Plan section) Acute Rehab PT Goals Patient Stated Goal: "I would really rather go home" PT Goal Formulation: With patient Time For Goal Achievement: 04/19/15 Potential to Achieve Goals: Good    Frequency BID   Barriers to discharge        Co-evaluation               End of Session Equipment Utilized During Treatment: Gait belt   Patient left: with chair alarm set;with call bell/phone within reach Nurse Communication: Mobility status         Time: 0835-0900 PT Time Calculation (min) (ACUTE ONLY): 25 min   Charges:   PT Evaluation $Initial PT Evaluation Tier I: 1 Procedure PT Treatments $Therapeutic Exercise: 8-22 mins   PT G Codes:       Loran Senters, PT, DPT [phone]  Malachi Pro 04/05/2015, 1:14 PM

## 2015-04-05 NOTE — Clinical Social Work Note (Signed)
Clinical Social Work Assessment  Patient Details  Name: Heidi Yoder MRN: [id number] Date of Birth: [birth year]  Date of referral:  04/05/15               Reason for consult:  Facility Placement                Permission sought to share information with:  Family Supports Permission granted to share information::  Yes, Verbal Permission Granted  Name::      (,)  Agency::     Relationship::     Contact Information:     Housing/Transportation Living arrangements for the past 2 months:  Single Family Home Source of Information:  Patient, Medical Team Patient Interpreter Needed:  None Criminal Activity/Legal Involvement Pertinent to Current Situation/Hospitalization:  No - Comment as needed Significant Relationships:  Adult Children, Spouse, Other Family Members Lives with:  Spouse Do you feel safe going back to the place where you live?  Yes Need for family participation in patient care:  Yes (Comment)  Care giving concerns:  None at this time   Office manager / plan:  Visual merchandiser (CSW) consult for SNF placement, Post op day one PT is recommending home health PT to assist with getting patient back to previous level of functioning.   Patient was alert, oriented and engaged in conversation with CSW.  CSW introduced self and explained role of CSW department.  Patient currently lives with her disabled husband.  She is his primary care taker.  Patient drives and was independent of ADL prior to her fall. CSW explained that PT is recommending home health rehab and patient is in agreement with the recommendation as she wants to go home to her husband.  Patient's support system is her two sons that live about 10 min away and her grandchildren.  State family will bring food and assist her at home.  Patient states she has never been to SNF. CSW reviewed SNF process with patient, explained that this is post op day 1 and PT will continue to monitor her.  If she needs SNF she  will go if this is what her doctor recommends.  Patient was not agreeable to SNF search at this time, however CSW provided a SNF list for her to review.  Patient willing to discuss SNF if the recommendation from PT changes within the next few days.   CSW will continue to follow patient for possible STR discharge needs if PT recommendation changes.   Employment status:  Retired Health and safety inspector:    PT Recommendations:  Home with Home Health Information / Referral to community resources:   (none at this time)  Patient/Family's Response to care:  Patient was appreciative of information provided by CSW and will review list.  Patient/Family's Understanding of and Emotional Response to Diagnosis, Current Treatment, and Prognosis:  Patient understands that she is under continued medical work up at this time.  Once medically stable she plans to discharge home with home health PT.  Emotional Assessment Appearance:  Appears stated age Attitude/Demeanor/Rapport:    Affect (typically observed):  Accepting, Adaptable, Hopeful, Calm, Pleasant Orientation:  Oriented to Self, Oriented to Place, Oriented to  Time, Oriented to Situation Alcohol / Substance use:    Psych involvement (Current and /or in the community):  No (Comment)  Discharge Needs  Concerns to be addressed:  Care Coordination, Discharge Planning Concerns Readmission within the last 30 days:  No Current discharge risk:  Dependent with Mobility Barriers to  Discharge:  Continued Medical Work up   Christian MateMoore, Wakeelah Solan H, LCSW 04/05/2015, 3:21 PM

## 2015-04-05 NOTE — Care Management Note (Signed)
Case Management Note  Patient Details  Name: Heidi Yoder MRN: [id number] Date of Birth: 04/19/1930  Subjective/Objective:    81yo Mrs Heidi Yoder was admitted 04/03/15 and received a left hip nailing on 04/04/15. Heidi Heidi Yoder resides with her husband who is disabled and is unable to assist her at home but would be able to phone for assistance if needed. Heidi Yoder reports that she has 2 sons and grandchildren who live nearby and who will provide meals and transportation for her as well as checking on her frequently. Heidi Yoder reports that she has a rolling walker but has no bedside commode. No home oxygen. PCP=Dr Julieanne Mansonichard Gilbert. Pharmacy=[facility] [address].  At this point [facility]-PT is recommending home health PT. Heidi Yoder chose [facility] to be her home health provider "because we used them several years ago for my husband." Case management will follow for discharge planning. Anticipate discharge home with home health.                 Action/Plan:   Expected Discharge Date:                  Expected Discharge Plan:     In-House Referral:     Discharge planning Services     Post Acute Care Choice:    Choice offered to:     DME Arranged:    DME Agency:     HH Arranged:    HH Agency:     Status of Service:     Medicare Important Message Given:    Date Medicare IM Given:    Medicare IM give by:    Date Additional Medicare IM Given:    Additional Medicare Important Message give by:     If discussed at Long Length of Stay Meetings, dates discussed:    Additional Comments:  Tashi Andujo A, RN 04/05/2015, 2:53 PM

## 2015-04-05 NOTE — Anesthesia Postprocedure Evaluation (Signed)
Anesthesia Post Note  Patient: Heidi RaiderRebecca D Oloughlin  Procedure(s) Performed: Procedure(s) (LRB): CANNULATED HIP PINNING (Left)  Patient location during evaluation: Nursing Unit Anesthesia Type: Spinal Level of consciousness: awake and alert Pain management: pain level controlled Vital Signs Assessment: post-procedure vital signs reviewed and stable Respiratory status: spontaneous breathing and respiratory function stable Cardiovascular status: stable Anesthetic complications: no    Last Vitals:  Filed Vitals:   04/05/15 0733 04/05/15 1515  BP: 128/79 130/68  Pulse: 58 61  Temp: 36.8 C 36.6 C  Resp: 16 16    Last Pain:  Filed Vitals:   04/05/15 1515  PainSc: 2                  KEPHART,WILLIAM K

## 2015-04-06 ENCOUNTER — Encounter: Payer: Self-pay | Admitting: Specialist

## 2015-04-06 LAB — CBC
HCT: 32.5 % — ABNORMAL LOW (ref 35.0–47.0)
HEMOGLOBIN: 11 g/dL — AB (ref 12.0–16.0)
MCH: 31.4 pg (ref 26.0–34.0)
MCHC: 33.9 g/dL (ref 32.0–36.0)
MCV: 92.5 fL (ref 80.0–100.0)
Platelets: 212 10*3/uL (ref 150–440)
RBC: 3.52 MIL/uL — ABNORMAL LOW (ref 3.80–5.20)
RDW: 12.8 % (ref 11.5–14.5)
WBC: 8.6 10*3/uL (ref 3.6–11.0)

## 2015-04-06 LAB — BASIC METABOLIC PANEL
Anion gap: 4 — ABNORMAL LOW (ref 5–15)
BUN: 17 mg/dL (ref 6–20)
CHLORIDE: 110 mmol/L (ref 101–111)
CO2: 26 mmol/L (ref 22–32)
CREATININE: 0.94 mg/dL (ref 0.44–1.00)
Calcium: 8.6 mg/dL — ABNORMAL LOW (ref 8.9–10.3)
GFR calc Af Amer: >60 mL/min (ref >60)
GFR, EST NON AFRICAN AMERICAN: 55 mL/min — AB (ref >60)
GLUCOSE: 97 mg/dL (ref 65–99)
POTASSIUM: 4 mmol/L (ref 3.5–5.1)
Sodium: 140 mmol/L (ref 135–145)

## 2015-04-06 MED ORDER — OXYBUTYNIN CHLORIDE 5 MG PO TABS
5.0000 mg | ORAL_TABLET | Freq: Two times a day (BID) | ORAL | Status: DC
  Administered 2015-04-06 – 2015-04-07 (×2): 5 mg via ORAL
  Filled 2015-04-06 (×2): qty 1

## 2015-04-06 MED ORDER — PANTOPRAZOLE SODIUM 40 MG PO TBEC
40.0000 mg | DELAYED_RELEASE_TABLET | Freq: Every day | ORAL | Status: DC
  Administered 2015-04-07: 40 mg via ORAL
  Filled 2015-04-06: qty 1

## 2015-04-06 MED ORDER — SIMVASTATIN 20 MG PO TABS
20.0000 mg | ORAL_TABLET | Freq: Every day | ORAL | Status: DC
  Administered 2015-04-06: 20 mg via ORAL
  Filled 2015-04-06: qty 1

## 2015-04-06 MED ORDER — LEVOTHYROXINE SODIUM 50 MCG PO TABS
50.0000 ug | ORAL_TABLET | Freq: Every day | ORAL | Status: DC
  Administered 2015-04-07: 50 ug via ORAL
  Filled 2015-04-06: qty 1

## 2015-04-06 MED ORDER — ASPIRIN 81 MG PO TABS
81.0000 mg | ORAL_TABLET | Freq: Every day | ORAL | Status: DC
  Filled 2015-04-06: qty 1

## 2015-04-06 MED ORDER — ENOXAPARIN SODIUM 40 MG/0.4ML ~~LOC~~ SOLN
40.0000 mg | SUBCUTANEOUS | Status: DC
  Administered 2015-04-07: 40 mg via SUBCUTANEOUS
  Filled 2015-04-06: qty 0.4

## 2015-04-06 MED ORDER — CALCIUM CARBONATE-VITAMIN D 500-200 MG-UNIT PO TABS
1.0000 | ORAL_TABLET | Freq: Two times a day (BID) | ORAL | Status: DC
  Administered 2015-04-06 – 2015-04-07 (×3): 1 via ORAL
  Filled 2015-04-06 (×3): qty 1

## 2015-04-06 MED ORDER — ASPIRIN EC 81 MG PO TBEC
81.0000 mg | DELAYED_RELEASE_TABLET | Freq: Every day | ORAL | Status: DC
  Administered 2015-04-06 – 2015-04-07 (×2): 81 mg via ORAL
  Filled 2015-04-06 (×2): qty 1

## 2015-04-06 MED ORDER — VITAMIN D 1000 UNITS PO TABS
1000.0000 [IU] | ORAL_TABLET | Freq: Every day | ORAL | Status: DC
  Administered 2015-04-06 – 2015-04-07 (×2): 1000 [IU] via ORAL
  Filled 2015-04-06 (×2): qty 1

## 2015-04-06 NOTE — Progress Notes (Signed)
Subjective: 2 Days Post-Op Procedure(s) (LRB): CANNULATED HIP PINNING (Left)    Patient reports pain as mild. Wants to go home tomorrow with HHPT  Objective:   VITALS:   Filed Vitals:   04/06/15 1051 04/06/15 1127  BP:  137/83  Pulse: 54 53  Temp:  97.6 F (36.4 C)  Resp:  18    Neurovascular intact Sensation intact distally Intact pulses distally Dorsiflexion/Plantar flexion intact Incision: no drainage No cellulitis present  LABS  Recent Labs  04/04/15 0418 04/05/15 0401 04/06/15 0408  HGB 11.3* 11.0* 11.0*  HCT 32.3* 31.7* 32.5*  WBC 8.6 9.0 8.6  PLT 223 205 212     Recent Labs  04/04/15 0418 04/05/15 0401 04/06/15 0408  NA 139 140 140  K 4.1 3.8 4.0  BUN 17 15 17   CREATININE 0.90 1.10* 0.94  GLUCOSE 100* 108* 97     Recent Labs  04/03/15 1618  INR 1.06     Assessment/Plan: 2 Days Post-Op Procedure(s) (LRB): CANNULATED HIP PINNING (Left)   Advance diet Up with therapy Discharge home with home health tomorrow

## 2015-04-06 NOTE — Progress Notes (Signed)
Physical Therapy Treatment Patient Details Name: Heidi Yoder MRN: [id number] DOB: [birth year] Today's Date: 04/06/2015    History of Present Illness Heidi Yoder is a 81 y.o. female with a known history of bilateral lower extremity peripheral neuropathy, hypothyroidism, hypertension and GERD presents today after mechanical fall and is found to have left femoral neck fracture. She reports that she was on the phone this morning and after hanging up she turned to walk to the door and tripped over the cord to her husband's electronic chair. She fell on her left side and then had to slide across the floor so she could pull up. She then had to walk to the front door to get her husband's attention. She had significant pain in the hip and leg and so was brought to the emergency room. X-ray shows minimally displaced left femoral transcervical neck fracture. Pt has L hip nailing/pinning on 04/04/15 with partial WB.     PT Comments    Pt remains up in chair with improved comfort on a pillow today versus previous day without pillow. Pt agreeable to stair training. Pt demonstrates improved ambulation quality this afternoon with improved sequence, step lengths and body position in regards to rolling walker. Family educated on ambulation as well. Pt/family also educated on stair climbing; pt performs stairs well with mildly decreased confidence, but safe technique and no loss of balance. Pt wishes to remain up in chair. Family supportive and planned on providing 24 hour supervision at home. Continue PT to progress strength, ambulation quality and distance to allow for improved functional mobility and safe transition home.   Follow Up Recommendations  Home health PT;Supervision/Assistance - 24 hour (family supportive and willing/planned on 24 care)     Equipment Recommendations  Rolling walker with 5" wheels    Recommendations for Other Services       Precautions / Restrictions  Precautions Precautions: Fall Restrictions Weight Bearing Restrictions: Yes LLE Weight Bearing: Partial weight bearing    Mobility  Bed Mobility Overal bed mobility: Modified Independent (use of rails and increased time)             General bed mobility comments: Not tested; up in chair  Transfers Overall transfer level: Needs assistance Equipment used: Rolling walker (2 wheeled) Transfers: Sit to/from Stand Sit to Stand: Min guard         General transfer comment: Improved stand; using BLEs relying on R a little more. Education to family on ability to use BLEs for stand  Ambulation/Gait Ambulation/Gait assistance: Min guard Ambulation Distance (Feet): 30 Feet (x2 recliner to/from wc to transport pt to stairs) Assistive device: Rolling walker (2 wheeled) Gait Pattern/deviations: Step-to pattern (PWB L; improved steps, sequence and body position to rw) Gait velocity: decreased Gait velocity interpretation: <1.8 ft/sec, indicative of risk for recurrent falls General Gait Details: Improved sequence, steps and body position to rw   Stairs Stairs: Yes Stairs assistance: Min guard Stair Management: Two rails;Step to pattern;Forwards Number of Stairs: 4 General stair comments: Education on sequence with good compliance; decreased confidence, but performs well.   Wheelchair Mobility    Modified Rankin (Stroke Patients Only)       Balance                                    Cognition Arousal/Alertness: Awake/alert Behavior During Therapy: WFL for tasks assessed/performed Overall Cognitive Status: Within Functional Limits for tasks assessed  Exercises General Exercises - Lower Extremity Ankle Circles/Pumps: AROM;Both;20 reps (long sit) Quad Sets: Strengthening;Both;20 reps (long sit) Gluteal Sets: Strengthening;Both;20 reps (long sit) Heel Slides: AROM;Left;20 reps (long sit)    General Comments        Pertinent  Vitals/Pain Pain Assessment: 0-10 Pain Score: 4  Pain Location: Left hip Pain Descriptors / Indicators: Aching Pain Intervention(s): Monitored during session;Ice applied    Home Living                      Prior Function            PT Goals (current goals can now be found in the care plan section) Acute Rehab PT Goals Patient Stated Goal: Would rather go home Progress towards PT goals: Progressing toward goals    Frequency  BID    PT Plan Current plan remains appropriate;Discharge plan needs to be updated    Co-evaluation             End of Session Equipment Utilized During Treatment: Gait belt Activity Tolerance: Patient tolerated treatment well Patient left: with chair alarm set;with call bell/phone within reach;with family/visitor present;in chair     Time: [id number] PT Time Calculation (min) (ACUTE ONLY): 28 min  Charges:  $Gait Training: 23-37 mins $Therapeutic Exercise: 8-22 mins                    G Codes:      Kristeen MissHeidi Elizabeth Bishop 04/06/2015, 2:07 PM

## 2015-04-06 NOTE — Care Management Important Message (Signed)
Important Message  Patient Details  Name: Heidi Yoder MRN: [id number] Date of Birth: Sep 18, 1929   Medicare Important Message Given:  Yes    Kyrillos Adams A, RN 04/06/2015, 8:38 AM

## 2015-04-06 NOTE — Progress Notes (Signed)
[facility] [place] [facility]   PATIENT NAME: Clarisse GougeRebecca Gilberti    MR#:  [id number]  DATE OF BIRTH:  10-17-1929  SUBJECTIVE:  CHIEF COMPLAINT:   Chief Complaint  Patient presents with  . Fall    S/p hip surgery 04/04/15- walked with PT, feels better- some pain in hip.  REVIEW OF SYSTEMS:  CONSTITUTIONAL: No fever, fatigue or weakness.  EYES: No blurred or double vision.  EARS, NOSE, AND THROAT: No tinnitus or ear pain.  RESPIRATORY: No cough, shortness of breath, wheezing or hemoptysis.  CARDIOVASCULAR: No chest pain, orthopnea, edema.  GASTROINTESTINAL: No nausea, vomiting, diarrhea or abdominal pain.  GENITOURINARY: No dysuria, hematuria.  ENDOCRINE: No polyuria, nocturia,  HEMATOLOGY: No anemia, easy bruising or bleeding SKIN: No rash or lesion. MUSCULOSKELETAL: left hip joint pain or arthritis.   NEUROLOGIC: No tingling, numbness, weakness.  PSYCHIATRY: No anxiety or depression.   ROS  DRUG ALLERGIES:  No Known Allergies  VITALS:  Blood pressure 146/68, pulse 57, temperature 98.2 F (36.8 C), temperature source Oral, resp. rate 20, height 5\' 1"  (1.549 m), weight 63.05 kg (139 lb), SpO2 94 %.  PHYSICAL EXAMINATION:  GENERAL:  81 y.o.-year-old patient lying in the bed with no acute distress.  EYES: Pupils equal, round, reactive to light and accommodation. No scleral icterus. Extraocular muscles intact.  HEENT: Head atraumatic, normocephalic. Oropharynx and nasopharynx clear.  NECK:  Supple, no jugular venous distention. No thyroid enlargement, no tenderness.  LUNGS: Normal breath sounds bilaterally, no wheezing, rales,rhonchi or crepitation. No use of accessory muscles of respiration.  CARDIOVASCULAR: S1, S2 normal. No murmurs, rubs, or gallops.  ABDOMEN: Soft, nontender, nondistended. Bowel sounds present. No organomegaly or mass.  EXTREMITIES: No pedal edema, cyanosis, or clubbing. Left hip joint pain. NEUROLOGIC: Cranial nerves II  through XII are intact. Muscle strength 5/5 in all extremities. Sensation intact. Gait not checked.  PSYCHIATRIC: The patient is alert and oriented x 3.  SKIN: No obvious rash, lesion, or ulcer.   Physical Exam LABORATORY PANEL:   CBC  Recent Labs Lab 04/06/15 0408  WBC 8.6  HGB 11.0*  HCT 32.5*  PLT 212   ------------------------------------------------------------------------------------------------------------------  Chemistries   Recent Labs Lab 04/03/15 1618  04/06/15 0408  NA 142  < > 140  K 3.3*  < > 4.0  CL 104  < > 110  CO2 27  < > 26  GLUCOSE 89  < > 97  BUN 20  < > 17  CREATININE 0.83  < > 0.94  CALCIUM 9.7  < > 8.6*  AST 24  --   --   ALT 20  --   --   ALKPHOS 50  --   --   BILITOT 0.5  --   --   < > = values in this interval not displayed. ------------------------------------------------------------------------------------------------------------------  Cardiac Enzymes No results for input(s): TROPONINI in the last 168 hours. ------------------------------------------------------------------------------------------------------------------  RADIOLOGY:  No results found.  ASSESSMENT AND PLAN:   Active Problems:   Closed left hip fracture (HCC)  1. Left femoral neck fracture:  s/p surgery 03/1015.  DVT prophylaxis, physical therapy, pain control.  Likely d/c tomorrow with PT arrangements at home.  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   Need more family support to help.  2. Hypertension -  Continue hydrochlorothiazide  3. Hypothyroidism -Continue Synthroid  4. Bilateral lower extremity peripheral neuropathy - She states this has been present for years and is idiopathic  TSH in range Vit B12- in process.   5. Hyperlipidemia - Continue statin  6. Hypokalemia   On admission- replaced- stable now.  All the records are reviewed and case discussed with Care Management/Social Workerr. Management plans discussed with the  patient, family and they are in agreement.  CODE STATUS: full  TOTAL TIME TAKING CARE OF THIS PATIENT: 35 minutes.  Discussed with social worker about discharge plan.  POSSIBLE D/C IN 1-2 DAYS, DEPENDING ON CLINICAL CONDITION.   Altamese Dilling M.D on 04/06/2015   Between 7am to 6pm - Pager - [phone]  After 6pm go to [email or web address] - password [email or web address] [facility]  [facility]  Office  [phone]  CC: Primary care physician; Megan Mans, MD  Note: This dictation was prepared with Dragon dictation along with smaller phrase technology. Any transcriptional errors that result from this process are unintentional.

## 2015-04-06 NOTE — Progress Notes (Signed)
Occupational Therapy Treatment Patient Details Name: Heidi Yoder MRN: [id number] DOB: 1929/08/24 Today's Date: 04/06/2015    History of present illness Heidi Yoder is a 81 y.o. female with a known history of bilateral lower extremity peripheral neuropathy, hypothyroidism, hypertension and GERD presents today after mechanical fall and is found to have left femoral neck fracture. She reports that she was on the phone this morning and after hanging up she turned to walk to the door and tripped over the cord to her husband's electronic chair. She fell on her left side and then had to slide across the floor so she could pull up. She then had to walk to the front door to get her husband's attention. She had significant pain in the hip and leg and so was brought to the emergency room. X-ray shows minimally displaced left femoral transcervical neck fracture. Pt has L hip nailing/pinning on 04/04/15 with partial WB.    OT comments  Patient declined actual dressing but willing to practice techniques for lower body dressing   Follow Up Recommendations  Home health OT 24 hr assist secondary to disabled husband.   Equipment Recommendations       Recommendations for Other Services      Precautions / Restrictions Precautions Precautions: Fall Restrictions Weight Bearing Restrictions: Yes LLE Weight Bearing: Partial weight bearing       Mobility Bed Mobility                Transfers    Balance                                   ADL  Patient declined actual dressing, but willing to practice lower body dressing techniques using hip kit. Patient Donned/doffed socks and pants to knees with verbal cues for technique and safety and very minimal assist.                                              Vision                     Perception     Praxis      Cognition   Behavior During Therapy: [facility] for tasks assessed/performed Overall  Cognitive Status: Within Functional Limits for tasks assessed                       Extremity/Trunk Assessment               Exercises   Shoulder Instructions       General Comments      Pertinent Vitals/ Pain       Pain Assessment: 0-10 Pain Score: 4  Pain Location: left hip Pain Descriptors / Indicators: Aching Pain Intervention(s): Monitored during session;Premedicated before session;Ice applied;Repositioned  Home Living                                          Prior Functioning/Environment              Frequency       Progress Toward Goals  OT Goals(current goals can now be found in the care plan section)     Acute  Rehab OT Goals Patient Stated Goal: Would rather go home  Plan      Co-evaluation                 End of Session Equipment Utilized During Treatment:  (hip kit)   Activity Tolerance     Patient Left in chair;with call bell/phone within reach;with chair alarm set;with family/visitor present   Nurse Communication          Time: 1135-1150 OT Time Calculation (min): 15 min  Charges: OT General Charges $OT Visit: 1 Procedure OT Treatments $Self Care/Home Management : 8-22 mins Sharon Mt, MS/OTR/L  Sharon Mt 04/06/2015, 11:58 AM

## 2015-04-06 NOTE — Progress Notes (Signed)
Physical Therapy Treatment Patient Details Name: Heidi Yoder MRN: [id number] DOB: 1930-01-12 Today's Date: 04/06/2015    History of Present Illness Heidi Yoder is a 81 y.o. female with a known history of bilateral lower extremity peripheral neuropathy, hypothyroidism, hypertension and GERD presents today after mechanical fall and is found to have left femoral neck fracture. She reports that she was on the phone this morning and after hanging up she turned to walk to the door and tripped over the cord to her husband's electronic chair. She fell on her left side and then had to slide across the floor so she could pull up. She then had to walk to the front door to get her husband's attention. She had significant pain in the hip and leg and so was brought to the emergency room. X-ray shows minimally displaced left femoral transcervical neck fracture. Pt has L hip nailing/pinning on 04/04/15 with partial WB.     PT Comments    Pt improving ambulation distance, but continues very slow with cues required for sequence. No LOB, stand breaks for fatigue. Cueing required for transfers for hand placement. Received up in chair and participates well with exercises. Plan to see pt for stair training this afternoon. 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 Recommend 24 hour assist/supervision for safety.   Follow Up Recommendations  Home health PT;Supervision/Assistance - 24 hour (needs to perform steps; may need 24 A d/t caring for spouse)     Equipment Recommendations  Rolling walker with 5" wheels    Recommendations for Other Services       Precautions / Restrictions Precautions Precautions: Fall Restrictions Weight Bearing Restrictions: Yes LLE Weight Bearing: Partial weight bearing    Mobility  Bed Mobility Overal bed mobility: Modified Independent (use of rails and increased time)                Transfers Overall  transfer level: Needs assistance Equipment used: Rolling walker (2 wheeled) Transfers: Sit to/from Stand Sit to Stand: Min guard (cues for safe hand placement)         General transfer comment: Pt attempts to stand NWB on L; education on PWB status and ability to use LLE for stand, cues for safe hand placement. Rw required lowering for proper fit before ambulation.   Ambulation/Gait Ambulation/Gait assistance: Min guard Ambulation Distance (Feet): 114 Feet Assistive device: Rolling walker (2 wheeled) Gait Pattern/deviations: Step-to pattern (PWB L) Gait velocity: decreased Gait velocity interpretation: <1.8 ft/sec, indicative of risk for recurrent falls General Gait Details: Pt requires extensive cueing first half of walk for decreased step length on L for improved sequence and body position in regards to rw. Pt improved with inconsistency for the remainder of walk. Compliance with PWB on L. Fatigues with noted increased soreness in LLE   Stairs            Wheelchair Mobility    Modified Rankin (Stroke Patients Only)       Balance                                    Cognition Arousal/Alertness: Awake/alert Behavior During Therapy: WFL for tasks assessed/performed Overall Cognitive Status: Within Functional Limits for tasks assessed                      Exercises General Exercises - Lower  Extremity Ankle Circles/Pumps: AROM;Both;20 reps (long sit) Quad Sets: Strengthening;Both;20 reps (long sit) Gluteal Sets: Strengthening;Both;20 reps (long sit) Heel Slides: AROM;Left;20 reps (long sit)    General Comments        Pertinent Vitals/Pain Pain Assessment: 0-10 Pain Score: 4  Pain Location: L hip/thigh Pain Descriptors / Indicators: Aching Pain Intervention(s): Monitored during session;Premedicated before session;Ice applied;Repositioned    Home Living                      Prior Function            PT Goals (current goals  can now be found in the care plan section) Progress towards PT goals: Progressing toward goals    Frequency  BID    PT Plan Current plan remains appropriate;Discharge plan needs to be updated (add 24 hour assist)    Co-evaluation             End of Session           Time: 1051-1130 PT Time Calculation (min) (ACUTE ONLY): 39 min  Charges:  $Gait Training: 23-37 mins $Therapeutic Exercise: 8-22 mins                    G Codes:      Kristeen MissHeidi Elizabeth Bishop 04/06/2015, 11:45 AM

## 2015-04-06 NOTE — Progress Notes (Signed)
   04/06/15 1338  Clinical Encounter Type  Visited With Patient and family together  Visit Type Initial  Consult/Referral To Chaplain  Stress Factors  Patient Stress Factors None identified  Chaplain rounded in unit and offered pastoral care.   Chaplain Guilherme Schwenke [phone]

## 2015-04-06 NOTE — Progress Notes (Signed)
Patient's up and ambulating in hallway with physical therapy.  

## 2015-04-06 NOTE — Progress Notes (Signed)
Spoke with Dr.Vachhani regarding resuming patients home medications.  MD to order pt's home medications.

## 2015-04-06 NOTE — Care Management Note (Signed)
Case Management Note  Patient Details  Name: Heidi Yoder MRN: [id number] Date of Birth: 18-May-1929  Subjective/Objective:  Anticipate discharge home on Tuesday 04/07/15. [facility]-PT is recommending home health PT. Please send home with orders for home health PT, and Child psychotherapistocial Worker. [name] chose [facility] to be her home health provider. Has a rolling walker but may need a BSC although she said she did not want one on 04/05/15.                    Action/Plan:   Expected Discharge Date:                  Expected Discharge Plan:     In-House Referral:     Discharge planning Services     Post Acute Care Choice:    Choice offered to:     DME Arranged:    DME Agency:     HH Arranged:    HH Agency:     Status of Service:     Medicare Important Message Given:  Yes Date Medicare IM Given:    Medicare IM give by:    Date Additional Medicare IM Given:    Additional Medicare Important Message give by:     If discussed at Long Length of Stay Meetings, dates discussed:    Additional Comments:  [name] A, RN 04/06/2015, 1:12 PM

## 2015-04-07 LAB — CBC
HCT: 34.6 % — ABNORMAL LOW (ref 35.0–47.0)
Hemoglobin: 11.6 g/dL — ABNORMAL LOW (ref 12.0–16.0)
MCH: 30.8 pg (ref 26.0–34.0)
MCHC: 33.5 g/dL (ref 32.0–36.0)
MCV: 92 fL (ref 80.0–100.0)
PLATELETS: 243 10*3/uL (ref 150–440)
RBC: 3.75 MIL/uL — ABNORMAL LOW (ref 3.80–5.20)
RDW: 12.5 % (ref 11.5–14.5)
WBC: 7.6 10*3/uL (ref 3.6–11.0)

## 2015-04-07 LAB — VITAMIN B12: Vitamin B-12: 1071 pg/mL — ABNORMAL HIGH (ref 180–914)

## 2015-04-07 MED ORDER — SENNA 8.6 MG PO TABS: 1.0000 | ORAL_TABLET | Freq: Two times a day (BID) | ORAL | Status: DC

## 2015-04-07 MED ORDER — MAGNESIUM HYDROXIDE 400 MG/5ML PO SUSP: 30.0000 mL | Freq: Every day | ORAL | Status: DC | PRN

## 2015-04-07 MED ORDER — HYDROCODONE-ACETAMINOPHEN 5-325 MG PO TABS: 1.0000 | ORAL_TABLET | Freq: Four times a day (QID) | ORAL | Status: DC | PRN

## 2015-04-07 MED ORDER — ASPIRIN 325 MG PO TBEC: 325.0000 mg | DELAYED_RELEASE_TABLET | Freq: Two times a day (BID) | ORAL | Status: DC

## 2015-04-07 MED ORDER — FERROUS SULFATE 325 (65 FE) MG PO TABS: 325.0000 mg | ORAL_TABLET | Freq: Every day | ORAL | Status: DC

## 2015-04-07 MED ORDER — ASPIRIN EC 325 MG PO TBEC: 325.0000 mg | DELAYED_RELEASE_TABLET | Freq: Two times a day (BID) | ORAL | Status: DC

## 2015-04-07 NOTE — Progress Notes (Signed)
Physical Therapy Treatment Patient Details Name: Heidi Yoder MRN: [id number] DOB: 1930-01-04 Today's Date: 04/07/2015    History of Present Illness Heidi Yoder is a 81 y.o. female with a known history of bilateral lower extremity peripheral neuropathy, hypothyroidism, hypertension and GERD presents today after mechanical fall and is found to have left femoral neck fracture. She reports that she was on the phone this morning and after hanging up she turned to walk to the door and tripped over the cord to her husband's electronic chair. She fell on her left side and then had to slide across the floor so she could pull up. She then had to walk to the front door to get her husband's attention. She had significant pain in the hip and leg and so was brought to the emergency room. X-ray shows minimally displaced left femoral transcervical neck fracture. Pt has L hip nailing/pinning on 04/04/15 with partial WB.     PT Comments    Pt demonstrating improved quality and distance with ambulation; continues to ambulate very slowly with fatigue, but no loss of balance. Pt verbalizes proper sequence for stair climbing. Participates in lower extremity exercises and manages toileting with set up. Pt received up in chair comfortably and is ready to discharge home today.   Follow Up Recommendations  Home health PT;Supervision/Assistance - 24 hour     Equipment Recommendations  Rolling walker with 5" wheels    Recommendations for Other Services       Precautions / Restrictions Precautions Precautions: Fall Restrictions Weight Bearing Restrictions: Yes LLE Weight Bearing: Partial weight bearing    Mobility  Bed Mobility Overal bed mobility: Modified Independent             General bed mobility comments: Increased time  Transfers Overall transfer level: Needs assistance Equipment used: Rolling walker (2 wheeled) Transfers: Sit to/from Stand Sit to Stand: Supervision          General transfer comment:  (Demonstrates safe hand placement from bed, to commode/chair)  Ambulation/Gait Ambulation/Gait assistance: Min guard Ambulation Distance (Feet): 135 Feet (and to bathroom/from bathroom) Assistive device: Rolling walker (2 wheeled) Gait Pattern/deviations: Step-to pattern (PWB left) Gait velocity: decreased Gait velocity interpretation: <1.8 ft/sec, indicative of risk for recurrent falls General Gait Details: Improved sequence, steps and body position to rw   Stairs         General stair comments: reviewed sequence verbally; pt demonstrates retention from yesterdays session  Wheelchair Mobility    Modified Rankin (Stroke Patients Only)       Balance                                    Cognition Arousal/Alertness: Awake/alert Behavior During Therapy: WFL for tasks assessed/performed Overall Cognitive Status: Within Functional Limits for tasks assessed                      Exercises General Exercises - Lower Extremity Ankle Circles/Pumps: AROM;Both;20 reps Long Arc Quad: AROM;Both;20 reps;Seated Hip Flexion/Marching: AROM;Both;20 reps;Seated Toe Raises: AROM;Both;20 reps;Seated Heel Raises: AROM;Both;20 reps;Seated    General Comments        Pertinent Vitals/Pain Pain Assessment: 0-10 Pain Score: 4  Pain Location: L knee more so than hip/thigh Pain Intervention(s): Monitored during session;Premedicated before session;Ice applied    Home Living  Prior Function            PT Goals (current goals can now be found in the care plan section) Progress towards PT goals: Progressing toward goals    Frequency  BID    PT Plan Current plan remains appropriate;Discharge plan needs to be updated    Co-evaluation             End of Session Equipment Utilized During Treatment: Gait belt Activity Tolerance: Patient tolerated treatment well Patient left: in chair;with call  bell/phone within reach;with chair alarm set;with SCD's reapplied (cold pack to L knee)     Time: 1610-96041039-1119 PT Time Calculation (min) (ACUTE ONLY): 40 min  Charges:  $Gait Training: 23-37 mins $Therapeutic Exercise: 8-22 mins                    G Codes:      Kristeen MissHeidi Elizabeth Bishop 04/07/2015, 11:27 AM

## 2015-04-07 NOTE — Progress Notes (Signed)
Subjective: 3 Days Post-Op Procedure(s) (LRB): CANNULATED HIP PINNING (Left)    Patient reports pain as mild. Ready to go home.  Will maintain TTWB only.  EC ASA 325mg  Bid for DVT prophylaxis.  RTC 10 days for X-ray.  Objective:   VITALS:   Filed Vitals:   04/07/15 0512 04/07/15 0751  BP: 141/61 154/57  Pulse: 63 57  Temp: 98.4 F (36.9 C) 98.2 F (36.8 C)  Resp: 18 18    Neurovascular intact Sensation intact distally Intact pulses distally Dorsiflexion/Plantar flexion intact Incision: no drainage  LABS  Recent Labs  04/05/15 0401 04/06/15 0408 04/07/15 0630  HGB 11.0* 11.0* 11.6*  HCT 31.7* 32.5* 34.6*  WBC 9.0 8.6 7.6  PLT 205 212 243     Recent Labs  04/05/15 0401 04/06/15 0408  NA 140 140  K 3.8 4.0  BUN 15 17  CREATININE 1.10* 0.94  GLUCOSE 108* 97    No results for input(s): LABPT, INR in the last 72 hours.   Assessment/Plan: 3 Days Post-Op Procedure(s) (LRB): CANNULATED HIP PINNING (Left)   Advance diet Discharge home with home health  RTC 10 days

## 2015-04-07 NOTE — Discharge Summary (Addendum)
[facility] at [facility]   PATIENT NAME: Heidi Yoder    MR#:  [id number]  DATE OF BIRTH:  October 27, 1929  DATE OF ADMISSION:  04/03/2015 ADMITTING PHYSICIAN: Gale Journey, MD  DATE OF DISCHARGE: 04/07/2015  PRIMARY CARE PHYSICIAN: Megan Mans, MD    ADMISSION DIAGNOSIS:  Femoral neck fracture, left, closed, initial encounter [S72.002A]  DISCHARGE DIAGNOSIS:  Active Problems:   Closed left hip fracture (HCC)   SECONDARY DIAGNOSIS:   Past Medical History  Diagnosis Date  . Hypertension   . Hypothyroidism   . GERD (gastroesophageal reflux disease)   . Peripheral neuropathy (HCC)     HOSPITAL COURSE:   1. Left femoral neck fracture: s/p surgery 03/1015. DVT prophylaxis, physical therapy, pain control. Likely d/c tomorrow with PT arrangements at home. 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  Need more family support to help.  2. Hypertension - Continue hydrochlorothiazide  3. Hypothyroidism -Continue Synthroid  4. Bilateral lower extremity peripheral neuropathy - She states this has been present for years and is idiopathic  TSH in range Vit B12- in process.  5. Hyperlipidemia - Continue statin  6. Hypokalemia  On admission- replaced- stable now.  DISCHARGE CONDITIONS:   Stable.  CONSULTS OBTAINED:  Treatment Team:  Deeann Saint, MD Altamese Dilling, MD  DRUG ALLERGIES:  No Known Allergies  DISCHARGE MEDICATIONS:   Current Discharge Medication List    START taking these medications   Details  aspirin EC 325 MG EC tablet Take 1 tablet (325 mg total) by mouth 2 (two) times daily. Qty: 30 tablet, Refills: 0    ferrous sulfate 325 (65 FE) MG tablet Take 1 tablet (325 mg total) by mouth daily with breakfast. Qty: 30 tablet, Refills: 3    HYDROcodone-acetaminophen (NORCO/VICODIN) 5-325 MG tablet Take 1 tablet by mouth every 6 (six) hours as needed for moderate pain or severe  pain. Qty: 30 tablet, Refills: 0    magnesium hydroxide (MILK OF MAGNESIA) 400 MG/5ML suspension Take 30 mLs by mouth daily as needed for mild constipation. Qty: 360 mL, Refills: 0    senna (SENOKOT) 8.6 MG TABS tablet Take 1 tablet (8.6 mg total) by mouth 2 (two) times daily. Qty: 20 each, Refills: 0      CONTINUE these medications which have NOT CHANGED   Details  CALCIUM-VITAMIN D PO Take 600 mg by mouth 2 (two) times daily.     cholecalciferol (VITAMIN D) 1000 UNITS tablet Take 1,000 Units by mouth daily.     Cyanocobalamin (VITAMIN B 12 PO) Take 1 tablet by mouth daily.    hydrochlorothiazide (HYDRODIURIL) 25 MG tablet TAKE 1 TABLET DAILY Qty: 90 tablet, Refills: 4    levothyroxine (SYNTHROID, LEVOTHROID) 50 MCG tablet Take 50 mcg by mouth daily before breakfast.     MULTIPLE VITAMIN PO Take by mouth.    omeprazole (PRILOSEC) 20 MG capsule Take 20 mg by mouth daily.     oxybutynin (DITROPAN) 5 MG tablet Take 5 mg by mouth.     simvastatin (ZOCOR) 20 MG tablet Take by mouth.    loratadine (CLARITIN REDITABS) 10 MG dissolvable tablet Take 1 tablet (10 mg total) by mouth daily. Qty: 30 tablet, Refills: 12   Associated Diagnoses: Allergic rhinitis, unspecified allergic rhinitis type      STOP taking these medications     aspirin 81 MG tablet          DISCHARGE INSTRUCTIONS:    Follow with Orthopedics  clinic in 1-2 weeks.  If you experience worsening of your admission symptoms, develop shortness of breath, life threatening emergency, suicidal or homicidal thoughts you must seek medical attention immediately by calling 911 or calling your MD immediately  if symptoms less severe.  You Must read complete instructions/literature along with all the possible adverse reactions/side effects for all the Medicines you take and that have been prescribed to you. Take any new Medicines after you have completely understood and accept all the possible adverse reactions/side  effects.   Please note  You were cared for by a hospitalist during your hospital stay. If you have any questions about your discharge medications or the care you received while you were in the hospital after you are discharged, you can call the unit and asked to speak with the hospitalist on call if the hospitalist that took care of you is not available. Once you are discharged, your primary care physician will handle any further medical issues. Please note that NO REFILLS for any discharge medications will be authorized once you are discharged, as it is imperative that you return to your primary care physician (or establish a relationship with a primary care physician if you do not have one) for your aftercare needs so that they can reassess your need for medications and monitor your lab values.    Today   CHIEF COMPLAINT:   Chief Complaint  Patient presents with  . Fall    HISTORY OF PRESENT ILLNESS:  Clarisse GougeRebecca Yoder  is a 81 y.o. female with a known history of bilateral lower extremity peripheral neuropathy, hypothyroidism, hypertension and GERD presents today after mechanical fall and is found to have left femoral neck fracture. She reports that she was on the phone this morning and after hanging up she turned to walk to the door and tripped over the cord to her husband's electronic chair. She fell on her left side and then had to slide across the floor so she could pull up. She then had to walk to the front door to get her husband's attention. She had significant pain in the hip and leg and so was brought to the emergency room. X-ray shows minimally displaced left femoral transcervical neck fracture. She has been seen by orthopedics and plan is for surgical repair in the morning.  VITAL SIGNS:  Blood pressure 154/57, pulse 57, temperature 98.2 F (36.8 C), temperature source Oral, resp. rate 18, height 5\' 1"  (1.549 m), weight 63.05 kg (139 lb), SpO2 98 %.  I/O:    Intake/Output  Summary (Last 24 hours) at 04/07/15 1409 Last data filed at 04/07/15 1401  Gross per 24 hour  Intake    480 ml  Output   1000 ml  Net   -520 ml    PHYSICAL EXAMINATION:   GENERAL: 81 y.o.-year-old patient lying in the bed with no acute distress.  EYES: Pupils equal, round, reactive to light and accommodation. No scleral icterus. Extraocular muscles intact.  HEENT: Head atraumatic, normocephalic. Oropharynx and nasopharynx clear.  NECK: Supple, no jugular venous distention. No thyroid enlargement, no tenderness.  LUNGS: Normal breath sounds bilaterally, no wheezing, rales,rhonchi or crepitation. No use of accessory muscles of respiration.  CARDIOVASCULAR: S1, S2 normal. No murmurs, rubs, or gallops.  ABDOMEN: Soft, nontender, nondistended. Bowel sounds present. No organomegaly or mass.  EXTREMITIES: No pedal edema, cyanosis, or clubbing. Left hip joint pain. NEUROLOGIC: Cranial nerves II through XII are intact. Muscle strength 5/5 in all extremities. Sensation intact. Gait not checked.  PSYCHIATRIC: The patient is alert and oriented x 3.  SKIN: No obvious rash, lesion, or ulcer.  DATA REVIEW:   CBC  Recent Labs Lab 04/07/15 0630  WBC 7.6  HGB 11.6*  HCT 34.6*  PLT 243    Chemistries   Recent Labs Lab 04/03/15 1618  04/06/15 0408  NA 142  < > 140  K 3.3*  < > 4.0  CL 104  < > 110  CO2 27  < > 26  GLUCOSE 89  < > 97  BUN 20  < > 17  CREATININE 0.83  < > 0.94  CALCIUM 9.7  < > 8.6*  AST 24  --   --   ALT 20  --   --   ALKPHOS 50  --   --   BILITOT 0.5  --   --   < > = values in this interval not displayed.  Cardiac Enzymes No results for input(s): TROPONINI in the last 168 hours.  Microbiology Results  Results for orders placed or performed during the hospital encounter of 04/03/15  MRSA PCR Screening     Status: None   Collection Time: 04/04/15  4:09 AM  Result Value Ref Range Status   MRSA by PCR NEGATIVE NEGATIVE Final    Comment:        The  GeneXpert MRSA Assay (FDA approved for NASAL specimens only), is one component of a comprehensive MRSA colonization surveillance program. It is not intended to diagnose MRSA infection nor to guide or monitor treatment for MRSA infections.     RADIOLOGY:  No results found.   Management plans discussed with the patient, family and they are in agreement.  CODE STATUS:     Code Status Orders        Start     Ordered   04/04/15 1804  Full code   Continuous     04/04/15 1804    Advance Directive Documentation        Most Recent Value   Type of Advance Directive  Living will   Pre-existing out of facility DNR order (yellow form or pink MOST form)     "MOST" Form in Place?        TOTAL TIME TAKING CARE OF THIS PATIENT:35 minutes.    Altamese Dilling M.D on 04/07/2015 at 2:09 PM  Between 7am to 6pm - Pager - [phone]  After 6pm go to [email or web address] - password [email or web address]  [place] [facility]  Office  [phone]  CC: Primary care physician; Megan Mans, MD   Note: This dictation was prepared with Dragon dictation along with smaller phrase technology. Any transcriptional errors that result from this process are unintentional.

## 2015-04-07 NOTE — Progress Notes (Signed)
Order to discharge patient to home with home health. Family at the bedside. Discharge instructions given to patient, home and new medications reviewed,Rx. Slip given. Patient verbalized understanding of discharge instructions given. Discharge via wheelchair with nurse tech.

## 2015-04-07 NOTE — Care Management Note (Signed)
Case Management Note  Patient Details  Name: Heidi Yoder MRN: [id number] Date of Birth: 1930-01-13  Subjective/Objective:   Ordered front wheel walker from [facility].                  Action/Plan:   Expected Discharge Date:                  Expected Discharge Plan:  Home w Home Health Services  In-House Referral:     Discharge planning Services  CM Consult  Post Acute Care Choice:  Durable Medical Equipment, Home Health Choice offered to:  Patient  DME Arranged:  Bedside commode DME Agency:  [facility]  HH Arranged:  PT, Social Work [employer] Agency:  [facility]  Status of Service:  Completed, signed off  Medicare Important Message Given:  Yes Date Medicare IM Given:    Medicare IM give by:    Date Additional Medicare IM Given:    Additional Medicare Important Message give by:     If discussed at Long Length of Stay Meetings, dates discussed:    Additional Comments:  Marily MemosLisa M Jaquille Kau, RN 04/07/2015, 1:07 PM

## 2015-04-07 NOTE — Care Management Note (Signed)
Case Management Note  Patient Details  Name: Heidi Yoder MRN: [id number] Date of Birth: 1929/09/04  Subjective/Objective:  Met with patient at bedside. Discharge today to home with [facility] (PT and SW), [facility] notified. Requested BSC, ordered from [facility] with [facility]. Patient agreeable to POC.                  Action/Plan:   Expected Discharge Date:                  Expected Discharge Plan:  [place]  In-House Referral:     Discharge planning Services  CM Consult  Post Acute Care Choice:  Durable Medical Equipment, Home Health Choice offered to:  Patient  DME Arranged:  Bedside commode DME Agency:  [place] Arranged:  PT, Social Work [employer] Agency:  [place]  Status of Service:  Completed, signed off  Medicare Important Message Given:  Yes Date Medicare IM Given:    Medicare IM give by:    Date Additional Medicare IM Given:    Additional Medicare Important Message give by:     If discussed at [place] of Stay Meetings, dates discussed:    Additional Comments:  Jolly Mango, RN 04/07/2015, 9:50 AM

## 2015-07-11 ENCOUNTER — Other Ambulatory Visit: Payer: Self-pay | Admitting: Family Medicine

## 2015-07-13 ENCOUNTER — Other Ambulatory Visit: Payer: Self-pay | Admitting: Family Medicine

## 2015-08-10 ENCOUNTER — Encounter: Payer: Self-pay | Admitting: Family Medicine

## 2015-08-10 ENCOUNTER — Ambulatory Visit (INDEPENDENT_AMBULATORY_CARE_PROVIDER_SITE_OTHER): Payer: Medicare Other | Admitting: Family Medicine

## 2015-08-10 VITALS — BP 112/68 | HR 64 | Temp 98.4°F | Resp 14 | Wt 136.0 lb

## 2015-08-10 DIAGNOSIS — M8080XA Other osteoporosis with current pathological fracture, unspecified site, initial encounter for fracture: Secondary | ICD-10-CM

## 2015-08-10 DIAGNOSIS — D649 Anemia, unspecified: Secondary | ICD-10-CM | POA: Diagnosis not present

## 2015-08-10 DIAGNOSIS — E039 Hypothyroidism, unspecified: Secondary | ICD-10-CM

## 2015-08-10 DIAGNOSIS — M8580 Other specified disorders of bone density and structure, unspecified site: Secondary | ICD-10-CM

## 2015-08-10 DIAGNOSIS — S7292XS Unspecified fracture of left femur, sequela: Secondary | ICD-10-CM | POA: Diagnosis not present

## 2015-08-10 DIAGNOSIS — I10 Essential (primary) hypertension: Secondary | ICD-10-CM | POA: Diagnosis not present

## 2015-08-10 DIAGNOSIS — K219 Gastro-esophageal reflux disease without esophagitis: Secondary | ICD-10-CM | POA: Diagnosis not present

## 2015-08-10 NOTE — Progress Notes (Signed)
Patient ID: Heidi Yoder, female   DOB: [birth year], 82 y.o.   MRN: [id number]    Subjective:  HPI  Hypertension, follow-up:  BP Readings from Last 3 Encounters:  08/10/15 112/68  04/07/15 130/69  02/09/15 120/78    She was last seen for hypertension 6 months ago.  BP at that visit was 131/66. Management since that visit includes none. She reports good compliance with treatment. She is not having side effects.  She is not exercising. She is adherent to low salt diet.   Outside blood pressures are not being checked. She is experiencing none.  Patient denies chest pain, chest pressure/discomfort, claudication, dyspnea, exertional chest pressure/discomfort, fatigue, irregular heart beat, lower extremity edema, near-syncope and orthopnea.    Wt Readings from Last 3 Encounters:  08/10/15 136 lb (61.689 kg)  04/03/15 139 lb (63.05 kg)  02/09/15 138 lb 6.4 oz (62.778 kg)   ------------------------------------------------------------------------  Pt fell in December and fractured her Femur. She had surgery to repair and is doing well. She is only using a cane now. Her surgeon told her to ask when her last BMD was and it was 08/28/12.    Prior to Admission medications   Medication Sig Start Date End Date Taking? Authorizing Provider  aspirin EC 325 MG EC tablet Take 1 tablet (325 mg total) by mouth 2 (two) times daily. 04/07/15   Altamese Dilling, MD  CALCIUM-VITAMIN D PO Take 600 mg by mouth 2 (two) times daily.  03/21/11   Historical Provider, MD  cholecalciferol (VITAMIN D) 1000 UNITS tablet Take 1,000 Units by mouth daily.  03/21/11   Historical Provider, MD  Cyanocobalamin (VITAMIN B 12 PO) Take 1 tablet by mouth daily.    Historical Provider, MD  ferrous sulfate 325 (65 FE) MG tablet Take 1 tablet (325 mg total) by mouth daily with breakfast. 04/07/15   Altamese Dilling, MD  hydrochlorothiazide (HYDRODIURIL) 25 MG tablet TAKE 1 TABLET DAILY 03/17/15   Maple Hudson., MD  HYDROcodone-acetaminophen (NORCO/VICODIN) 5-325 MG tablet Take 1 tablet by mouth every 6 (six) hours as needed for moderate pain or severe pain. 04/07/15   Altamese Dilling, MD  levothyroxine (SYNTHROID, LEVOTHROID) 50 MCG tablet TAKE 1 TABLET DAILY 07/13/15   Maple Hudson., MD  loratadine (CLARITIN REDITABS) 10 MG dissolvable tablet Take 1 tablet (10 mg total) by mouth daily. Patient not taking: Reported on 04/03/2015 02/09/15   Maple Hudson., MD  magnesium hydroxide (MILK OF MAGNESIA) 400 MG/5ML suspension Take 30 mLs by mouth daily as needed for mild constipation. 04/07/15   Altamese Dilling, MD  MULTIPLE VITAMIN PO Take by mouth. 03/21/11   Historical Provider, MD  omeprazole (PRILOSEC) 20 MG capsule Take 20 mg by mouth daily.  05/30/14   Historical Provider, MD  oxybutynin (DITROPAN) 5 MG tablet TAKE 1 TABLET TWICE A DAY 07/14/15   Richard Hulen Shouts., MD  senna (SENOKOT) 8.6 MG TABS tablet Take 1 tablet (8.6 mg total) by mouth 2 (two) times daily. 04/07/15   Altamese Dilling, MD  simvastatin (ZOCOR) 20 MG tablet Take by mouth. 09/12/14   Historical Provider, MD    Patient Active Problem List   Diagnosis Date Noted  . Closed left hip fracture (HCC) 04/03/2015  . Allergic rhinitis 02/06/2015  . Back pain, chronic 02/06/2015  . CAFL (chronic airflow limitation) (HCC) 02/06/2015  . Essential (primary) hypertension 02/06/2015  . GERD (gastroesophageal reflux disease) 02/06/2015  . History of colon polyps 02/06/2015  .  Hypercholesteremia 02/06/2015  . Hypothyroidism 02/06/2015  . Arthritis, degenerative 02/06/2015  . Neuropathy (HCC) 02/06/2015  . Osteopenia 02/06/2015  . Post menopausal syndrome 02/06/2015  . Scoliosis 02/06/2015  . Female stress incontinence 02/06/2015  . B12 deficiency 02/06/2015  . Avitaminosis D 02/06/2015    Past Medical History  Diagnosis Date  . Hypertension   . Hypothyroidism   . GERD (gastroesophageal reflux  disease)   . Peripheral neuropathy [facility])     Social History   Social History  . Marital Status: Married    Spouse Name: N/A  . Number of Children: N/A  . Years of Education: N/A   Occupational History  . Not on file.   Social History Main Topics  . Smoking status: Never Smoker   . Smokeless tobacco: Not on file  . Alcohol Use: No  . Drug Use: No  . Sexual Activity: Not on file   Other Topics Concern  . Not on file   Social History Narrative    No Known Allergies  Review of Systems  Constitutional: Negative.   HENT: Negative.   Eyes: Negative.   Respiratory: Negative.   Cardiovascular: Negative.   Gastrointestinal: Negative.   Genitourinary: Negative.   Musculoskeletal: Positive for joint pain.  Skin: Negative.   Neurological: Negative.   Endo/Heme/Allergies: Negative.   Psychiatric/Behavioral: Negative.     Immunization History  Administered Date(s) Administered  . Influenza, High Dose Seasonal PF 02/09/2015  . Pneumococcal Conjugate-13 08/07/2013  . Pneumococcal Polysaccharide-23 05/03/2000  . Td 12/18/2003   Objective:  BP 112/68 mmHg  Pulse 64  Temp(Src) 98.4 F (36.9 C) (Oral)  Resp 14  Wt 136 lb (61.689 kg)  Physical Exam  Constitutional: She is oriented to person, place, and time and well-developed, well-nourished, and in no distress.  HENT:  Head: Normocephalic and atraumatic.  Right Ear: External ear normal.  Left Ear: External ear normal.  Nose: Nose normal.  Eyes: Conjunctivae and EOM are normal. Pupils are equal, round, and reactive to light.  Neck: Normal range of motion. Neck supple.  Cardiovascular: Normal rate, regular rhythm, normal heart sounds and intact distal pulses.   Pulmonary/Chest: Effort normal and breath sounds normal.  Abdominal: Soft.  Neurological: She is alert and oriented to person, place, and time. She has normal reflexes. GCS score is 15.  Walking with cane today.  Skin: Skin is warm and dry.  Psychiatric:  Mood, memory, affect and judgment normal.    Lab Results  Component Value Date   WBC 7.6 04/07/2015   HGB 11.6* 04/07/2015   HCT 34.6* 04/07/2015   PLT 243 04/07/2015   GLUCOSE 97 04/06/2015   CHOL 188 08/18/2014   TRIG 136 08/18/2014   HDL 59 08/18/2014   LDLCALC 102 08/18/2014   TSH 0.897 04/03/2015   INR 1.06 04/03/2015    CMP     Component Value Date/Time   NA 140 04/06/2015 0408   NA 142 08/18/2014   K 4.0 04/06/2015 0408   CL 110 04/06/2015 0408   CO2 26 04/06/2015 0408   GLUCOSE 97 04/06/2015 0408   BUN 17 04/06/2015 0408   BUN 26* 08/18/2014   CREATININE 0.94 04/06/2015 0408   CREATININE 1.0 08/18/2014   CALCIUM 8.6* 04/06/2015 0408   PROT 7.6 04/03/2015 1618   ALBUMIN 3.9 04/03/2015 1618   AST 24 04/03/2015 1618   ALT 20 04/03/2015 1618   ALKPHOS 50 04/03/2015 1618   BILITOT 0.5 04/03/2015 1618   GFRNONAA 55* 04/06/2015 0408  GFRAA >60 04/06/2015 0408    Assessment and Plan :  1. Essential (primary) hypertension   2. Gastroesophageal reflux disease, esophagitis presence not specified   3. Hypothyroidism, unspecified hypothyroidism type   4. Anemia, unspecified anemia type   5. Osteopenia   6. Closed fracture of left femur, unspecified fracture morphology, unspecified portion of femur, sequela (HCC)   I have done the exam and reviewed the above chart and it is accurate to the best of my knowledge.  Patient was seen and examined by Dr. Julieanne Mansonichard Gilbert, and noted scribed by Dimas ChyleBrittany Byrd, CMA  Julieanne Mansonichard Gilbert MD [facility] 08/10/2015 2:11 PM

## 2015-08-25 LAB — HM DEXA SCAN

## 2015-09-03 ENCOUNTER — Other Ambulatory Visit: Payer: Self-pay | Admitting: Family Medicine

## 2015-09-15 ENCOUNTER — Telehealth: Payer: Self-pay | Admitting: Family Medicine

## 2015-09-15 ENCOUNTER — Other Ambulatory Visit: Payer: Self-pay | Admitting: Family Medicine

## 2015-09-15 NOTE — Telephone Encounter (Signed)
Please advise. The BMD is in the images and has been scanned. thanks

## 2015-09-15 NOTE — Telephone Encounter (Signed)
BMD is not clear cut.  Not osteoporosis by one measure,   But 10 year risk is over 4 percent by FRAX, so medication should be considered. Recommend follow up with Dr Sullivan LoneGilbert to evaluate and treat. Thanks.

## 2015-09-15 NOTE — Telephone Encounter (Signed)
Pt called redaring the results from  Her bone density test.  She had it done may2 and hasnt heard back.  Her call back is [phone]  Thanks, Barth Kirksteri

## 2015-09-16 NOTE — Telephone Encounter (Signed)
Advised patient as below. Patient reports that she will call back and schedule appt due to not having her calendar available.

## 2015-09-28 ENCOUNTER — Ambulatory Visit: Payer: Medicare Other | Admitting: Family Medicine

## 2015-09-29 ENCOUNTER — Ambulatory Visit (INDEPENDENT_AMBULATORY_CARE_PROVIDER_SITE_OTHER): Payer: Medicare Other | Admitting: Family Medicine

## 2015-09-29 VITALS — BP 108/50 | HR 60 | Temp 98.2°F | Resp 16 | Wt 131.0 lb

## 2015-09-29 DIAGNOSIS — M80059D Age-related osteoporosis with current pathological fracture, unspecified femur, subsequent encounter for fracture with routine healing: Secondary | ICD-10-CM

## 2015-09-29 NOTE — Progress Notes (Signed)
Patient ID: Heidi SeenRebecca D Yoder, female   DOB: 1930/04/08, 82 y.o.   MRN: [id number]   Heidi SeenRebecca D Yoder  MRN: [id number] DOB: 1930/04/08  Subjective:  HPI   The patient is an 82 year old female who presents today to discuss the results of her BMD.  Patient had fallen in December and it resulted in a fracture of her hip.  Her BMD was done on 08/25/15.     Patient Active Problem List   Diagnosis Date Noted  . Closed left hip fracture (HCC) 04/03/2015  . Allergic rhinitis 02/06/2015  . Back pain, chronic 02/06/2015  . CAFL (chronic airflow limitation) (HCC) 02/06/2015  . Essential (primary) hypertension 02/06/2015  . GERD (gastroesophageal reflux disease) 02/06/2015  . History of colon polyps 02/06/2015  . Hypercholesteremia 02/06/2015  . Hypothyroidism 02/06/2015  . Arthritis, degenerative 02/06/2015  . Neuropathy (HCC) 02/06/2015  . Osteopenia 02/06/2015  . Post menopausal syndrome 02/06/2015  . Scoliosis 02/06/2015  . Female stress incontinence 02/06/2015  . B12 deficiency 02/06/2015  . Avitaminosis D 02/06/2015    Past Medical History  Diagnosis Date  . Hypertension   . Hypothyroidism   . GERD (gastroesophageal reflux disease)   . Peripheral neuropathy [facility](HCC)     Social History   Social History  . Marital Status: Married    Spouse Name: N/A  . Number of Children: N/A  . Years of Education: N/A   Occupational History  . Not on file.   Social History Main Topics  . Smoking status: Never Smoker   . Smokeless tobacco: Not on file  . Alcohol Use: No  . Drug Use: No  . Sexual Activity: Not on file   Other Topics Concern  . Not on file   Social History Narrative    Outpatient Prescriptions Prior to Visit  Medication Sig Dispense Refill  . aspirin EC 325 MG EC tablet Take 1 tablet (325 mg total) by mouth 2 (two) times daily. (Patient taking differently: Take 81 mg by mouth 2 (two) times daily. ) 30 tablet 0  . CALCIUM-VITAMIN D PO Take 600 mg by mouth 2  (two) times daily.     . cholecalciferol (VITAMIN D) 1000 UNITS tablet Take 1,000 Units by mouth daily.     . Cyanocobalamin (VITAMIN B 12 PO) Take 1 tablet by mouth daily.    . ferrous sulfate 325 (65 FE) MG tablet Take 1 tablet (325 mg total) by mouth daily with breakfast. 30 tablet 3  . hydrochlorothiazide (HYDRODIURIL) 25 MG tablet TAKE 1 TABLET DAILY 90 tablet 4  . levothyroxine (SYNTHROID, LEVOTHROID) 50 MCG tablet TAKE 1 TABLET DAILY 90 tablet 11  . loratadine (CLARITIN REDITABS) 10 MG dissolvable tablet Take 1 tablet (10 mg total) by mouth daily. 30 tablet 12  . MULTIPLE VITAMIN PO Take by mouth.    Marland Kitchen. omeprazole (PRILOSEC) 20 MG capsule TAKE 1 CAPSULE DAILY 90 capsule 3  . oxybutynin (DITROPAN) 5 MG tablet TAKE 1 TABLET TWICE A DAY 180 tablet 2  . senna (SENOKOT) 8.6 MG TABS tablet Take 1 tablet (8.6 mg total) by mouth 2 (two) times daily. 20 each 0  . simvastatin (ZOCOR) 20 MG tablet TAKE 1 TABLET AT BEDTIME 90 tablet 0  . HYDROcodone-acetaminophen (NORCO/VICODIN) 5-325 MG tablet Take 1 tablet by mouth every 6 (six) hours as needed for moderate pain or severe pain. (Patient not taking: Reported on 08/10/2015) 30 tablet 0   No facility-administered medications prior to visit.    No Known Allergies  Review of Systems  Constitutional: Negative for fever, malaise/fatigue and diaphoresis.  Respiratory: Negative for cough, shortness of breath and wheezing.   Cardiovascular: Negative for chest pain, palpitations, orthopnea, claudication, leg swelling and PND.   Objective:  BP 108/50 mmHg  Pulse 60  Temp(Src) 98.2 F (36.8 C) (Oral)  Resp 16  Wt 131 lb (59.421 kg)  Physical Exam  Constitutional: She is well-developed, well-nourished, and in no distress.  HENT:  Head: Normocephalic and atraumatic.  Right Ear: External ear normal.  Left Ear: External ear normal.  Nose: Nose normal.  Eyes: Conjunctivae are normal.  Abdominal: Soft.  Skin: Skin is warm and dry.  Psychiatric:  Mood, memory, affect and judgment normal.    Assessment and Plan :  No diagnosis found. Osteoporotic hip fracture Worse BMD at the femoral neck is T score of -2.0. We'll consider further treatment of this. I have done the exam and reviewed the above chart and it is accurate to the best of my knowledge.  Julieanne Manson MD [facility] 09/29/2015 3:17 PM

## 2015-12-16 ENCOUNTER — Encounter: Payer: Self-pay | Admitting: Family Medicine

## 2015-12-16 ENCOUNTER — Ambulatory Visit (INDEPENDENT_AMBULATORY_CARE_PROVIDER_SITE_OTHER): Payer: Medicare Other | Admitting: Family Medicine

## 2015-12-16 VITALS — BP 122/56 | HR 60 | Temp 97.8°F | Resp 16 | Ht 60.0 in | Wt 133.0 lb

## 2015-12-16 DIAGNOSIS — Z0000 Encounter for general adult medical examination without abnormal findings: Secondary | ICD-10-CM | POA: Diagnosis not present

## 2015-12-16 DIAGNOSIS — E7800 Pure hypercholesterolemia, unspecified: Secondary | ICD-10-CM

## 2015-12-16 DIAGNOSIS — E039 Hypothyroidism, unspecified: Secondary | ICD-10-CM | POA: Diagnosis not present

## 2015-12-16 DIAGNOSIS — I10 Essential (primary) hypertension: Secondary | ICD-10-CM | POA: Diagnosis not present

## 2015-12-16 NOTE — Progress Notes (Addendum)
Patient: Heidi Yoder, Female    DOB: 02-27-1930, 82 y.o.   MRN: [id number] Visit Date: 12/16/2015  Today's Provider: Megan Mans, MD   Chief Complaint  Patient presents with  . Medicare Wellness   Subjective:   Heidi Yoder is a 82 y.o. female who presents today for her Subsequent Annual Wellness Visit. She feels well. She reports she is not exercising due to leg. She reports she is sleeping fairly well. Husband is in failing health and patient has to provide care to him. Her medical problems are stable but she has not had blood work in a year. She does complain of excess wax in her left ear. Colonoscopy- 12/17/10 tubular adenoma BMD- 08/25/15 osteopenia Mammogram- 11/28/13 normal  Immunization History  Administered Date(s) Administered  . Influenza, High Dose Seasonal PF 02/09/2015  . Pneumococcal Conjugate-13 08/07/2013  . Pneumococcal Polysaccharide-23 05/03/2000  . Td 12/18/2003     Review of Systems  Constitutional: Negative.   Eyes: Positive for discharge and itching.  Respiratory: Negative.   Cardiovascular: Negative.   Gastrointestinal: Negative.   Endocrine: Negative.   Genitourinary: Negative.   Musculoskeletal: Negative.   Allergic/Immunologic: Negative.   Neurological: Positive for numbness and headaches.  Hematological: Negative.   Psychiatric/Behavioral: Negative.     Patient Active Problem List   Diagnosis Date Noted  . Closed left hip fracture (HCC) 04/03/2015  . Allergic rhinitis 02/06/2015  . Back pain, chronic 02/06/2015  . CAFL (chronic airflow limitation) (HCC) 02/06/2015  . Essential (primary) hypertension 02/06/2015  . GERD (gastroesophageal reflux disease) 02/06/2015  . History of colon polyps 02/06/2015  . Hypercholesteremia 02/06/2015  . Hypothyroidism 02/06/2015  . Arthritis, degenerative 02/06/2015  . Neuropathy (HCC) 02/06/2015  . Osteopenia 02/06/2015  . Post menopausal syndrome 02/06/2015  . Scoliosis 02/06/2015  .  Female stress incontinence 02/06/2015  . B12 deficiency 02/06/2015  . Avitaminosis D 02/06/2015    Social History   Social History  . Marital status: Married    Spouse name: N/A  . Number of children: N/A  . Years of education: N/A   Occupational History  . Not on file.   Social History Main Topics  . Smoking status: Never Smoker  . Smokeless tobacco: Never Used  . Alcohol use No  . Drug use: No  . Sexual activity: Not on file   Other Topics Concern  . Not on file   Social History Narrative  . No narrative on file    Past Surgical History:  Procedure Laterality Date  . ABDOMINAL HYSTERECTOMY     due to cervical hyperlasia  . BACK SURGERY     spinal stenosis and back surgery  . CATARACT EXTRACTION Bilateral   . HIP PINNING,CANNULATED Left 04/04/2015   Procedure: CANNULATED HIP PINNING;  Surgeon: Deeann Saint, MD;  Location: [facility] ORS;  Service: Orthopedics;  Laterality: Left;  . UPPER GI ENDOSCOPY  10/17/03   normal duodenum, normal esophagus, normal stomach    Her family history includes CVA in her father; Colon polyps in her brother and sister; Heart disease in her brother; Hypertension in her sister; Ovarian cancer in her mother.    Outpatient Medications Prior to Visit  Medication Sig Dispense Refill  . aspirin EC 325 MG EC tablet Take 1 tablet (325 mg total) by mouth 2 (two) times daily. (Patient taking differently: Take 81 mg by mouth 2 (two) times daily. ) 30 tablet 0  . CALCIUM-VITAMIN D PO Take 600 mg by mouth 2 (two) times daily.     Marland Kitchen  cholecalciferol (VITAMIN D) 1000 UNITS tablet Take 1,000 Units by mouth daily.     . Cyanocobalamin (VITAMIN B 12 PO) Take 1 tablet by mouth daily.    . hydrochlorothiazide (HYDRODIURIL) 25 MG tablet TAKE 1 TABLET DAILY 90 tablet 4  . levothyroxine (SYNTHROID, LEVOTHROID) 50 MCG tablet TAKE 1 TABLET DAILY 90 tablet 11  . loratadine (CLARITIN REDITABS) 10 MG dissolvable tablet Take 1 tablet (10 mg total) by mouth daily. 30  tablet 12  . MULTIPLE VITAMIN PO Take by mouth.    Marland Kitchen. omeprazole (PRILOSEC) 20 MG capsule TAKE 1 CAPSULE DAILY 90 capsule 3  . oxybutynin (DITROPAN) 5 MG tablet TAKE 1 TABLET TWICE A DAY 180 tablet 2  . senna (SENOKOT) 8.6 MG TABS tablet Take 1 tablet (8.6 mg total) by mouth 2 (two) times daily. 20 each 0  . simvastatin (ZOCOR) 20 MG tablet TAKE 1 TABLET AT BEDTIME 90 tablet 0  . ferrous sulfate 325 (65 FE) MG tablet Take 1 tablet (325 mg total) by mouth daily with breakfast. (Patient not taking: Reported on 12/16/2015) 30 tablet 3   No facility-administered medications prior to visit.     No Known Allergies  Patient Care Team: Maple Hudsonichard L  Jr., MD as PCP - General (Family Medicine)  Objective:   Vitals:  Vitals:   12/16/15 1040  BP: (!) 122/56  Pulse: 60  Resp: 16  Temp: 97.8 F (36.6 C)  TempSrc: Oral  Weight: 133 lb (60.3 kg)  Height: 5' (1.524 m)    Physical Exam  Constitutional: She is oriented to person, place, and time. She appears well-developed and well-nourished.  HENT:  Head: Normocephalic and atraumatic.  Right Ear: External ear normal.  Left Ear: External ear normal.  Nose: Nose normal.  Mouth/Throat: Oropharynx is clear and moist.  Eyes: Conjunctivae and EOM are normal. Pupils are equal, round, and reactive to light.  Neck: Normal range of motion. Neck supple.  Cardiovascular: Normal rate, regular rhythm, normal heart sounds and intact distal pulses.   Pulmonary/Chest: Effort normal and breath sounds normal.  Abdominal: Soft. Bowel sounds are normal.  Musculoskeletal: Normal range of motion.  Neurological: She is alert and oriented to person, place, and time. She has normal reflexes.  Skin: Skin is warm and dry.  Psychiatric: She has a normal mood and affect. Her behavior is normal. Judgment and thought content normal.    Activities of Daily Living In your present state of health, do you have any difficulty performing the following activities:  04/03/2015 04/03/2015  Hearing? - N  Vision? - N  Difficulty concentrating or making decisions? - N  Walking or climbing stairs? - N  Dressing or bathing? - N  Doing errands, shopping? N -  Some recent data might be hidden    Fall Risk Assessment Fall Risk  12/16/2015 08/10/2015  Falls in the past year? Yes Yes  Number falls in past yr: 1 1  Injury with Fall? Yes Yes     Depression Screen PHQ 2/9 Scores 12/16/2015 08/10/2015  PHQ - 2 Score 0 0    Cognitive Testing - 6-CIT    Year: 0 points  Month: 0 points  Memorize "Floyde ParkinsJohn, [place], [address], [address] [address], [place]"  Time (within 1 hour:) 0 points  Count backwards from 20: 0 points  Name months of year: 0 points  Repeat Address: 4 points   Total Score: 4/28  Interpretation : Normal (0-7) Abnormal (8-28)    Assessment & Plan:     Annual  Wellness Visit  Reviewed patient's Family Medical History Reviewed and updated list of patient's medical providers Assessment of cognitive impairment was done Assessed patient's functional ability Established a written schedule for health screening services Health Risk Assessent Completed and Reviewed  Exercise Activities and Dietary recommendations Goals    None      Immunization History  Administered Date(s) Administered  . Influenza, High Dose Seasonal PF 02/09/2015  . Pneumococcal Conjugate-13 08/07/2013  . Pneumococcal Polysaccharide-23 05/03/2000  . Td 12/18/2003    Health Maintenance  Topic Date Due  . ZOSTAVAX  04/11/1992  . TETANUS/TDAP  12/17/2013  . INFLUENZA VACCINE  11/24/2015  . DEXA SCAN  Completed  . PNA vac Low Risk Adult  Completed   1. Medicare annual wellness visit, subsequent   2. Essential (primary) hypertension  - CBC with Differential/Platelet  3. Hypothyroidism, unspecified hypothyroidism type  - TSH  4. Hypercholesteremia  - Lipid Panel With LDL/HDL Ratio - Comprehensive metabolic panel 5. Ceruminosis Left EAC cleared of cerumen with  irrigation   Discussed health benefits of physical activity, and encouraged her to engage in regular exercise appropriate for her age and condition.    I have done the exam and reviewed the above chart and it is accurate to the best of my knowledge.  Julieanne Mansonichard  MD [facility] 12/16/2015 10:47 AM  ------------------------------------------------------------------------------------------------------------

## 2015-12-17 LAB — CBC WITH DIFFERENTIAL/PLATELET
BASOS: 1 %
Basophils Absolute: 0.1 10*3/uL (ref 0.0–0.2)
EOS (ABSOLUTE): 0.3 10*3/uL (ref 0.0–0.4)
EOS: 3 %
HEMATOCRIT: 33.2 % — AB (ref 34.0–46.6)
HEMOGLOBIN: 11.1 g/dL (ref 11.1–15.9)
IMMATURE GRANS (ABS): 0 10*3/uL (ref 0.0–0.1)
Immature Granulocytes: 0 %
LYMPHS ABS: 3.2 10*3/uL — AB (ref 0.7–3.1)
LYMPHS: 36 %
MCH: 31.1 pg (ref 26.6–33.0)
MCHC: 33.4 g/dL (ref 31.5–35.7)
MCV: 93 fL (ref 79–97)
MONOCYTES: 7 %
Monocytes Absolute: 0.6 10*3/uL (ref 0.1–0.9)
NEUTROS ABS: 4.7 10*3/uL (ref 1.4–7.0)
Neutrophils: 53 %
Platelets: 288 10*3/uL (ref 150–379)
RBC: 3.57 x10E6/uL — ABNORMAL LOW (ref 3.77–5.28)
RDW: 13.2 % (ref 12.3–15.4)
WBC: 8.8 10*3/uL (ref 3.4–10.8)

## 2015-12-17 LAB — COMPREHENSIVE METABOLIC PANEL
ALBUMIN: 4 g/dL (ref 3.5–4.7)
ALT: 11 IU/L (ref 0–32)
AST: 14 IU/L (ref 0–40)
Albumin/Globulin Ratio: 1.4 (ref 1.2–2.2)
Alkaline Phosphatase: 54 IU/L (ref 39–117)
BUN/Creatinine Ratio: 21 (ref 12–28)
BUN: 19 mg/dL (ref 8–27)
Bilirubin Total: 0.4 mg/dL (ref 0.0–1.2)
CO2: 26 mmol/L (ref 18–29)
CREATININE: 0.92 mg/dL (ref 0.57–1.00)
Calcium: 9.6 mg/dL (ref 8.7–10.3)
Chloride: 98 mmol/L (ref 96–106)
GFR calc non Af Amer: 58 mL/min/{1.73_m2} — ABNORMAL LOW (ref >59)
GFR, EST AFRICAN AMERICAN: 67 mL/min/{1.73_m2} (ref >59)
GLUCOSE: 83 mg/dL (ref 65–99)
Globulin, Total: 2.8 g/dL (ref 1.5–4.5)
POTASSIUM: 3.8 mmol/L (ref 3.5–5.2)
SODIUM: 140 mmol/L (ref 134–144)
TOTAL PROTEIN: 6.8 g/dL (ref 6.0–8.5)

## 2015-12-17 LAB — TSH: TSH: 1.51 u[IU]/mL (ref 0.450–4.500)

## 2015-12-17 LAB — LIPID PANEL WITH LDL/HDL RATIO
CHOLESTEROL TOTAL: 208 mg/dL — AB (ref 100–199)
HDL: 51 mg/dL (ref >39)
LDL Calculated: 107 mg/dL — ABNORMAL HIGH (ref 0–99)
LDl/HDL Ratio: 2.1 ratio units (ref 0.0–3.2)
TRIGLYCERIDES: 248 mg/dL — AB (ref 0–149)
VLDL CHOLESTEROL CAL: 50 mg/dL — AB (ref 5–40)

## 2015-12-25 ENCOUNTER — Other Ambulatory Visit: Payer: Self-pay | Admitting: Family Medicine

## 2015-12-29 LAB — HM MAMMOGRAPHY

## 2016-02-10 ENCOUNTER — Telehealth: Payer: Self-pay | Admitting: Family Medicine

## 2016-02-10 NOTE — Telephone Encounter (Signed)
LM for pt to make AWV appt in Nov, j. strack

## 2016-04-01 ENCOUNTER — Other Ambulatory Visit: Payer: Self-pay | Admitting: Family Medicine

## 2016-04-04 ENCOUNTER — Ambulatory Visit: Payer: Medicare Other | Admitting: Family Medicine

## 2016-05-03 ENCOUNTER — Other Ambulatory Visit: Payer: Self-pay | Admitting: Family Medicine

## 2016-08-05 ENCOUNTER — Other Ambulatory Visit: Payer: Self-pay | Admitting: Family Medicine

## 2016-09-05 ENCOUNTER — Other Ambulatory Visit: Payer: Self-pay | Admitting: Family Medicine

## 2016-10-12 ENCOUNTER — Telehealth: Payer: Self-pay | Admitting: Family Medicine

## 2016-11-22 ENCOUNTER — Encounter: Payer: Self-pay | Admitting: Family Medicine

## 2016-12-21 ENCOUNTER — Encounter: Payer: Medicare Other | Admitting: Family Medicine

## 2016-12-21 ENCOUNTER — Ambulatory Visit: Payer: Medicare Other

## 2016-12-29 ENCOUNTER — Ambulatory Visit: Payer: Medicare Other | Admitting: Family Medicine

## 2016-12-29 ENCOUNTER — Telehealth: Payer: Self-pay | Admitting: Family Medicine

## 2016-12-29 ENCOUNTER — Ambulatory Visit (INDEPENDENT_AMBULATORY_CARE_PROVIDER_SITE_OTHER): Payer: Medicare Other

## 2016-12-29 VITALS — BP 138/86 | HR 68 | Temp 98.0°F | Ht 60.0 in | Wt 129.0 lb

## 2016-12-29 VITALS — BP 138/86 | HR 68 | Temp 98.0°F | Ht 60.0 in | Wt 129.6 lb

## 2016-12-29 DIAGNOSIS — K589 Irritable bowel syndrome without diarrhea: Secondary | ICD-10-CM | POA: Diagnosis not present

## 2016-12-29 DIAGNOSIS — Z0000 Encounter for general adult medical examination without abnormal findings: Secondary | ICD-10-CM

## 2016-12-29 DIAGNOSIS — E7800 Pure hypercholesterolemia, unspecified: Secondary | ICD-10-CM

## 2016-12-29 DIAGNOSIS — K219 Gastro-esophageal reflux disease without esophagitis: Secondary | ICD-10-CM

## 2016-12-29 DIAGNOSIS — I10 Essential (primary) hypertension: Secondary | ICD-10-CM | POA: Diagnosis not present

## 2016-12-29 DIAGNOSIS — Z23 Encounter for immunization: Secondary | ICD-10-CM

## 2016-12-29 MED ORDER — HYOSCYAMINE SULFATE 0.125 MG PO TABS: 0.2500 mg | ORAL_TABLET | Freq: Two times a day (BID) | ORAL | 5 refills | Status: DC

## 2016-12-29 MED ORDER — RANITIDINE HCL 150 MG PO CAPS: 150.0000 mg | ORAL_CAPSULE | Freq: Two times a day (BID) | ORAL | 11 refills | Status: DC

## 2016-12-29 NOTE — Telephone Encounter (Signed)
Pt called saying the pharmacy told her that her insurance will not cover the medication that Dr. Sullivan LoneGilbert just called in for her for her stomach.  She will needs something else sent in that insurance will pay for.  She uses [facility]  Her call back is [phone]  Thanks Fortune Brandsteri

## 2016-12-29 NOTE — Progress Notes (Signed)
Subjective:   Heidi Yoder SeenRebecca D Yoder is a 83 y.o. female who presents for Medicare Annual (Subsequent) preventive examination.  Review of Systems:  N/A  Cardiac Risk Factors include: advanced age (>7055men, 75>65 women);dyslipidemia;hypertension     Objective:     Vitals: BP 138/86 (BP Location: Left Arm)   Pulse 68   Temp 98 F (36.7 C) (Oral)   Ht 5' (1.524 m)   Wt 129 lb 9.6 oz (58.8 kg)   BMI 25.31 kg/m   Body mass index is 25.31 kg/m.   Tobacco History  Smoking Status  . Never Smoker  Smokeless Tobacco  . Never Used     Counseling given: Not Answered   Past Medical History:  Diagnosis Date  . GERD (gastroesophageal reflux disease)   . Hypertension   . Hypothyroidism   . Peripheral neuropathy    Past Surgical History:  Procedure Laterality Date  . ABDOMINAL HYSTERECTOMY     due to cervical hyperlasia  . BACK SURGERY     spinal stenosis and back surgery  . CATARACT EXTRACTION Bilateral   . HIP PINNING,CANNULATED Left 04/04/2015   Procedure: CANNULATED HIP PINNING;  Surgeon: Deeann SaintHoward Miller, MD;  Location: [facility] ORS;  Service: Orthopedics;  Laterality: Left;  . UPPER GI ENDOSCOPY  10/17/03   normal duodenum, normal esophagus, normal stomach   Family History  Problem Relation Age of Onset  . Ovarian cancer Mother   . Hypertension Sister   . Colon polyps Sister   . Cancer Sister        breast  . Heart disease Brother   . Colon polyps Brother   . CVA Father    History  Sexual Activity  . Sexual activity: Not on file    Outpatient Encounter Prescriptions as of 12/29/2016  Medication Sig  . aspirin 81 MG tablet Take 81 mg by mouth daily.  Marland Kitchen. CALCIUM-VITAMIN D PO Take 600 mg by mouth 2 (two) times daily.   . cholecalciferol (VITAMIN D) 1000 UNITS tablet Take 1,000 Units by mouth daily.   . Cyanocobalamin (VITAMIN B 12 PO) Take 600 mg by mouth daily.   Marland Kitchen. docusate sodium (STOOL SOFTENER) 100 MG capsule Take 100 mg by mouth daily.  . hydrochlorothiazide  (HYDRODIURIL) 25 MG tablet TAKE 1 TABLET DAILY  . levothyroxine (SYNTHROID, LEVOTHROID) 50 MCG tablet TAKE 1 TABLET DAILY  . MULTIPLE VITAMIN PO Take by mouth.  Marland Kitchen. omeprazole (PRILOSEC) 20 MG capsule TAKE 1 CAPSULE DAILY  . oxybutynin (DITROPAN) 5 MG tablet TAKE 1 TABLET TWICE A DAY  . simvastatin (ZOCOR) 20 MG tablet TAKE 1 TABLET AT BEDTIME (WILL NEED APPOINTMENT FOR LABS BEFORE GETTING ANOTHER PRESCRIPTION)  . [DISCONTINUED] loratadine (CLARITIN REDITABS) 10 MG dissolvable tablet Take 1 tablet (10 mg total) by mouth daily.  . [DISCONTINUED] aspirin EC 325 MG EC tablet Take 1 tablet (325 mg total) by mouth 2 (two) times daily. (Patient taking differently: Take 81 mg by mouth 2 (two) times daily. )  . [DISCONTINUED] ferrous sulfate 325 (65 FE) MG tablet Take 1 tablet (325 mg total) by mouth daily with breakfast. (Patient not taking: Reported on 12/16/2015)  . [DISCONTINUED] senna (SENOKOT) 8.6 MG TABS tablet Take 1 tablet (8.6 mg total) by mouth 2 (two) times daily.   No facility-administered encounter medications on file as of 12/29/2016.     Activities of Daily Living In your present state of health, do you have any difficulty performing the following activities: 12/29/2016  Hearing? N  Vision? N  Difficulty concentrating or making decisions? N  Walking or climbing stairs? Y  Dressing or bathing? N  Doing errands, shopping? N  Preparing Food and eating ? N  Using the Toilet? N  In the past six months, have you accidently leaked urine? Y  Comment wears protection  Do you have problems with loss of bowel control? N  Managing your Medications? N  Managing your Finances? N  Housekeeping or managing your Housekeeping? N  Some recent data might be hidden    Patient Care Team: Maple Hudson., MD as PCP - General (Family Medicine)    Assessment:     Exercise Activities and Dietary recommendations Current Exercise Habits: The patient does not participate in regular exercise at  present, Exercise limited by: orthopedic condition(s)  Goals    . Increase water intake          Recommend increasing water intake to 4 glasses a day.       Fall Risk Fall Risk  12/29/2016 12/16/2015 08/10/2015  Falls in the past year? No Yes Yes  Number falls in past yr: - 1 1  Injury with Fall? - Yes Yes   Depression Screen PHQ 2/9 Scores 12/29/2016 12/16/2015 08/10/2015  PHQ - 2 Score 0 0 0  PHQ- 9 Score 0 - -     Cognitive Function     6CIT Screen 12/29/2016  What Year? 0 points  What month? 0 points  What time? 0 points  Count back from 20 0 points  Months in reverse 0 points  Repeat phrase 0 points  Total Score 0    Immunization History  Administered Date(s) Administered  . Influenza, High Dose Seasonal PF 02/09/2015  . Pneumococcal Conjugate-13 08/07/2013  . Pneumococcal Polysaccharide-23 05/03/2000  . Td 12/18/2003   Screening Tests Health Maintenance  Topic Date Due  . TETANUS/TDAP  12/17/2013  . INFLUENZA VACCINE  11/23/2016  . DEXA SCAN  Completed  . PNA vac Low Risk Adult  Completed      Plan:  I have personally reviewed and addressed the Medicare Annual Wellness questionnaire and have noted the following in the patient's chart:  A. Medical and social history B. Use of alcohol, tobacco or illicit drugs  C. Current medications and supplements D. Functional ability and status E.  Nutritional status F.  Physical activity G. Advance directives H. List of other physicians I.  Hospitalizations, surgeries, and ER visits in previous 12 months J.  Vitals K. Screenings such as hearing and vision if needed, cognitive and depression L. Referrals and appointments - none  In addition, I have reviewed and discussed with patient certain preventive protocols, quality metrics, and best practice recommendations. A written personalized care plan for preventive services as well as general preventive health recommendations were provided to patient.  See attached scanned  questionnaire for additional information.   Signed,  Hyacinth Meeker, LPN Nurse Health Advisor   MD Recommendations: None, pt decline tetanus vaccine today.

## 2016-12-29 NOTE — Patient Instructions (Signed)
Heidi Yoder , Thank you Danne Baxterfor taking time to come for your Medicare Wellness Visit. I appreciate your ongoing commitment to your health goals. Please review the following plan we discussed and let me know if I can assist you in the future.   Screening recommendations/referrals: Colonoscopy: up to date and no longer required Mammogram: up to date Bone Density: up to date Recommended yearly ophthalmology/optometry visit for glaucoma screening and checkup Recommended yearly dental visit for hygiene and checkup  Vaccinations: Influenza vaccine: completed today Pneumococcal vaccine: completed series Tdap vaccine: declined Shingles vaccine: declined  Advanced directives: Please bring a copy of your POA (Power of Attorney) and/or Living Will to your next appointment.   Conditions/risks identified: Recommend increasing water intake to 4 glasses a day.   Next appointment: Next with PCP   Preventive Care 65 Years and Older, Female Preventive care refers to lifestyle choices and visits with your health care provider that can promote health and wellness. What does preventive care include?  A yearly physical exam. This is also called an annual well check.  Dental exams once or twice a year.  Routine eye exams. Ask your health care provider how often you should have your eyes checked.  Personal lifestyle choices, including:  Daily care of your teeth and gums.  Regular physical activity.  Eating a healthy diet.  Avoiding tobacco and drug use.  Limiting alcohol use.  Practicing safe sex.  Taking low-dose aspirin every day.  Taking vitamin and mineral supplements as recommended by your health care provider. What happens during an annual well check? The services and screenings done by your health care provider during your annual well check will depend on your age, overall health, lifestyle risk factors, and family history of disease. Counseling  Your health care provider may ask you  questions about your:  Alcohol use.  Tobacco use.  Drug use.  Emotional well-being.  Home and relationship well-being.  Sexual activity.  Eating habits.  History of falls.  Memory and ability to understand (cognition).  Work and work Astronomerenvironment.  Reproductive health. Screening  You may have the following tests or measurements:  Height, weight, and BMI.  Blood pressure.  Lipid and cholesterol levels. These may be checked every 5 years, or more frequently if you are over 81 years old.  Skin check.  Lung cancer screening. You may have this screening every year starting at age 81 if you have a 30-pack-year history of smoking and currently smoke or have quit within the past 15 years.  Fecal occult blood test (FOBT) of the stool. You may have this test every year starting at age 81.  Flexible sigmoidoscopy or colonoscopy. You may have a sigmoidoscopy every 5 years or a colonoscopy every 10 years starting at age 81.  Hepatitis C blood test.  Hepatitis B blood test.  Sexually transmitted disease (STD) testing.  Diabetes screening. This is done by checking your blood sugar (glucose) after you have not eaten for a while (fasting). You may have this done every 1-3 years.  Bone density scan. This is done to screen for osteoporosis. You may have this done starting at age 81.  Mammogram. This may be done every 1-2 years. Talk to your health care provider about how often you should have regular mammograms. Talk with your health care provider about your test results, treatment options, and if necessary, the need for more tests. Vaccines  Your health care provider may recommend certain vaccines, such as:  Influenza vaccine. This is  recommended every year.  Tetanus, diphtheria, and acellular pertussis (Tdap, Td) vaccine. You may need a Td booster every 10 years.  Zoster vaccine. You may need this after age 42.  Pneumococcal 13-valent conjugate (PCV13) vaccine. One dose is  recommended after age 42.  Pneumococcal polysaccharide (PPSV23) vaccine. One dose is recommended after age 57. Talk to your health care provider about which screenings and vaccines you need and how often you need them. This information is not intended to replace advice given to you by your health care provider. Make sure you discuss any questions you have with your health care provider. Document Released: 05/08/2015 Document Revised: 12/30/2015 Document Reviewed: 02/10/2015 Elsevier Interactive Patient Education  2017 [place] Prevention in the Home Falls can cause injuries. They can happen to people of all ages. There are many things you can do to make your home safe and to help prevent falls. What can I do on the outside of my home?  Regularly fix the edges of walkways and driveways and fix any cracks.  Remove anything that might make you trip as you walk through a door, such as a raised step or threshold.  Trim any bushes or trees on the path to your home.  Use bright outdoor lighting.  Clear any walking paths of anything that might make someone trip, such as rocks or tools.  Regularly check to see if handrails are loose or broken. Make sure that both sides of any steps have handrails.  Any raised decks and porches should have guardrails on the edges.  Have any leaves, snow, or ice cleared regularly.  Use sand or salt on walking paths during winter.  Clean up any spills in your garage right away. This includes oil or grease spills. What can I do in the bathroom?  Use night lights.  Install grab bars by the toilet and in the tub and shower. Do not use towel bars as grab bars.  Use non-skid mats or decals in the tub or shower.  If you need to sit down in the shower, use a plastic, non-slip stool.  Keep the floor dry. Clean up any water that spills on the floor as soon as it happens.  Remove soap buildup in the tub or shower regularly.  Attach bath mats  securely with double-sided non-slip rug tape.  Do not have throw rugs and other things on the floor that can make you trip. What can I do in the bedroom?  Use night lights.  Make sure that you have a light by your bed that is easy to reach.  Do not use any sheets or blankets that are too big for your bed. They should not hang down onto the floor.  Have a firm chair that has side arms. You can use this for support while you get dressed.  Do not have throw rugs and other things on the floor that can make you trip. What can I do in the kitchen?  Clean up any spills right away.  Avoid walking on wet floors.  Keep items that you use a lot in easy-to-reach places.  If you need to reach something above you, use a strong step stool that has a grab bar.  Keep electrical cords out of the way.  Do not use floor polish or wax that makes floors slippery. If you must use wax, use non-skid floor wax.  Do not have throw rugs and other things on the floor that can make you trip.  What can I do with my stairs?  Do not leave any items on the stairs.  Make sure that there are handrails on both sides of the stairs and use them. Fix handrails that are broken or loose. Make sure that handrails are as long as the stairways.  Check any carpeting to make sure that it is firmly attached to the stairs. Fix any carpet that is loose or worn.  Avoid having throw rugs at the top or bottom of the stairs. If you do have throw rugs, attach them to the floor with carpet tape.  Make sure that you have a light switch at the top of the stairs and the bottom of the stairs. If you do not have them, ask someone to add them for you. What else can I do to help prevent falls?  Wear shoes that:  Do not have high heels.  Have rubber bottoms.  Are comfortable and fit you well.  Are closed at the toe. Do not wear sandals.  If you use a stepladder:  Make sure that it is fully opened. Do not climb a closed  stepladder.  Make sure that both sides of the stepladder are locked into place.  Ask someone to hold it for you, if possible.  Clearly mark and make sure that you can see:  Any grab bars or handrails.  First and last steps.  Where the edge of each step is.  Use tools that help you move around (mobility aids) if they are needed. These include:  Canes.  Walkers.  Scooters.  Crutches.  Turn on the lights when you go into a dark area. Replace any light bulbs as soon as they burn out.  Set up your furniture so you have a clear path. Avoid moving your furniture around.  If any of your floors are uneven, fix them.  If there are any pets around you, be aware of where they are.  Review your medicines with your doctor. Some medicines can make you feel dizzy. This can increase your chance of falling. Ask your doctor what other things that you can do to help prevent falls. This information is not intended to replace advice given to you by your health care provider. Make sure you discuss any questions you have with your health care provider. Document Released: 02/05/2009 Document Revised: 09/17/2015 Document Reviewed: 05/16/2014 Elsevier Interactive Patient Education  2017 [employer].

## 2016-12-29 NOTE — Progress Notes (Signed)
Patient: Heidi Yoder, Female    DOB: [birth year], 83 y.o.   MRN: [id number] Visit Date: 12/29/2016  Today's Provider: Megan Mans, MD   Chief Complaint  Patient presents with  . Annual Exam   Subjective:   Heidi Yoder is a 83 y.o. female who presents today for her Subsequent Annual Wellness Visit. She feels well. She reports exercising none. She reports she is sleeping fairly well. Patients husband died 2016-10-28 and she has been mourning. Immunization History  Administered Date(s) Administered  . Influenza, High Dose Seasonal PF 02/09/2015, 12/29/2016  . Pneumococcal Conjugate-13 08/07/2013  . Pneumococcal Polysaccharide-23 05/03/2000  . Td 12/18/2003   12/17/10 Colonoscopy, Elliott-Diverticulosis, hemorrhoids, Traditional tubular adenoma, serrated adenoma, repeat 3 years 12/29/15 Mammogram 08/25/15 BND Osteopenia    Review of Systems  Constitutional: Negative.   HENT: Negative.   Eyes: Negative.   Respiratory: Positive for shortness of breath (happened once when she was congested and had to cough it out).   Cardiovascular: Negative.   Gastrointestinal: Positive for constipation.       Irregular abdominal discomfort relieved by a bowel movement.  Endocrine: Negative.   Genitourinary: Negative.   Musculoskeletal: Negative.   Skin: Negative.   Allergic/Immunologic: Negative.   Neurological: Positive for headaches.  Hematological: Negative.   Psychiatric/Behavioral: Negative.     Patient Active Problem List   Diagnosis Date Noted  . Closed left hip fracture (HCC) 04/03/2015  . Allergic rhinitis 02/06/2015  . Back pain, chronic 02/06/2015  . CAFL (chronic airflow limitation) (HCC) 02/06/2015  . Essential (primary) hypertension 02/06/2015  . GERD (gastroesophageal reflux disease) 02/06/2015  . History of colon polyps 02/06/2015  . Hypercholesteremia 02/06/2015  . Hypothyroidism 02/06/2015  . Arthritis, degenerative 02/06/2015  . Neuropathy 02/06/2015  .  Osteopenia 02/06/2015  . Post menopausal syndrome 02/06/2015  . Scoliosis 02/06/2015  . Female stress incontinence 02/06/2015  . B12 deficiency 02/06/2015  . Avitaminosis D 02/06/2015    Social History   Social History  . Marital status: Married    Spouse name: N/A  . Number of children: N/A  . Years of education: N/A   Occupational History  . Not on file.   Social History Main Topics  . Smoking status: Never Smoker  . Smokeless tobacco: Never Used  . Alcohol use No  . Drug use: No  . Sexual activity: Not on file   Other Topics Concern  . Not on file   Social History Narrative  . No narrative on file    Past Surgical History:  Procedure Laterality Date  . ABDOMINAL HYSTERECTOMY     due to cervical hyperlasia  . BACK SURGERY     spinal stenosis and back surgery  . CATARACT EXTRACTION Bilateral   . HIP PINNING,CANNULATED Left 04/04/2015   Procedure: CANNULATED HIP PINNING;  Surgeon: Deeann Saint, MD;  Location: [facility] ORS;  Service: Orthopedics;  Laterality: Left;  . UPPER GI ENDOSCOPY  10/17/03   normal duodenum, normal esophagus, normal stomach    Her family history includes CVA in her father; Cancer in her sister; Colon polyps in her brother and sister; Heart disease in her brother; Hypertension in her sister; Ovarian cancer in her mother.     Outpatient Encounter Prescriptions as of 12/29/2016  Medication Sig  . aspirin 81 MG tablet Take 81 mg by mouth daily.  Marland Kitchen CALCIUM-VITAMIN D PO Take 600 mg by mouth 2 (two) times daily.   . cholecalciferol (VITAMIN D) 1000 UNITS tablet Take 1,000 Units by  mouth daily.   . Cyanocobalamin (VITAMIN B 12 PO) Take 600 mg by mouth daily.   Marland Kitchen. docusate sodium (STOOL SOFTENER) 100 MG capsule Take 100 mg by mouth daily.  . hydrochlorothiazide (HYDRODIURIL) 25 MG tablet TAKE 1 TABLET DAILY  . levothyroxine (SYNTHROID, LEVOTHROID) 50 MCG tablet TAKE 1 TABLET DAILY  . MULTIPLE VITAMIN PO Take by mouth.  Marland Kitchen. omeprazole (PRILOSEC) 20 MG  capsule TAKE 1 CAPSULE DAILY  . oxybutynin (DITROPAN) 5 MG tablet TAKE 1 TABLET TWICE A DAY  . simvastatin (ZOCOR) 20 MG tablet TAKE 1 TABLET AT BEDTIME (WILL NEED APPOINTMENT FOR LABS BEFORE GETTING ANOTHER PRESCRIPTION)   No facility-administered encounter medications on file as of 12/29/2016.     No Known Allergies  Patient Care Team: Maple HudsonGilbert, Emarie Paul L Jr., MD as PCP - General (Family Medicine)   Objective:   Vitals:  Vitals:   12/29/16 0953  BP: 138/86  Pulse: 68  Temp: 98 F (36.7 C)  TempSrc: Oral  Weight: 129 lb (58.5 kg)  Height: 5' (1.524 m)    Physical Exam  Constitutional: She is oriented to person, place, and time. She appears well-developed and well-nourished.  HENT:  Head: Normocephalic and atraumatic.  Right Ear: External ear normal.  Left Ear: External ear normal.  Nose: Nose normal.  Mouth/Throat: Oropharynx is clear and moist.  Eyes: Pupils are equal, round, and reactive to light. Conjunctivae and EOM are normal.  Neck: Normal range of motion. Neck supple.  Cardiovascular: Normal rate, regular rhythm, normal heart sounds and intact distal pulses.   Pulmonary/Chest: Effort normal and breath sounds normal.  Abdominal: Soft. Bowel sounds are normal.  Musculoskeletal: Normal range of motion.  Increased thoracic kyphosis.  Neurological: She is alert and oriented to person, place, and time.  Skin: Skin is warm and dry.  Psychiatric: She has a normal mood and affect. Her behavior is normal. Judgment and thought content normal.    Activities of Daily Living In your present state of health, do you have any difficulty performing the following activities: 12/29/2016  Hearing? N  Vision? N  Difficulty concentrating or making decisions? N  Walking or climbing stairs? Y  Dressing or bathing? N  Doing errands, shopping? N  Preparing Food and eating ? N  Using the Toilet? N  In the past six months, have you accidently leaked urine? Y  Comment wears protection   Do you have problems with loss of bowel control? N  Managing your Medications? N  Managing your Finances? N  Housekeeping or managing your Housekeeping? N  Some recent data might be hidden    Fall Risk Assessment Fall Risk  12/29/2016 12/16/2015 08/10/2015  Falls in the past year? No Yes Yes  Number falls in past yr: - 1 1  Injury with Fall? - Yes Yes     Depression Screen PHQ 2/9 Scores 12/29/2016 12/16/2015 08/10/2015  PHQ - 2 Score 0 0 0  PHQ- 9 Score 0 - -     Assessment & Plan:     Annual Wellness Visit  Reviewed patient's Family Medical History Reviewed and updated list of patient's medical providers Assessment of cognitive impairment was done Assessed patient's functional ability Established a written schedule for health screening services Health Risk Assessent Completed and Reviewed  Exercise Activities and Dietary recommendations Goals    . Increase water intake          Recommend increasing water intake to 4 glasses a day.  Immunization History  Administered Date(s) Administered  . Influenza, High Dose Seasonal PF 02/09/2015, 12/29/2016  . Pneumococcal Conjugate-13 08/07/2013  . Pneumococcal Polysaccharide-23 05/03/2000  . Td 12/18/2003    Health Maintenance  Topic Date Due  . INFLUENZA VACCINE  04/25/2026 (Originally 11/23/2016)  . TETANUS/TDAP  12/30/2026  . DEXA SCAN  Completed  . PNA vac Low Risk Adult  Completed     Discussed health benefits of physical activity, and encouraged her to engage in regular exercise appropriate for her age and condition.  IBS Try Levsin BID prn. GERD Stoop omeprazole and Try Zantac  BID Abrasion left Forearm Tubular Adenoma of Colon Refer back to GI for options.  I have done the exam and reviewed the chart and it is accurate to the best of my knowledge. Dentist has been used and  any errors in dictation or transcription are unintentional. Julieanne Manson M.D. [facility]

## 2016-12-29 NOTE — Patient Instructions (Signed)
Stop Omeprazole and start ranitidine (Zantac)

## 2017-01-02 LAB — COMPREHENSIVE METABOLIC PANEL
AG RATIO: 1.3 (calc) (ref 1.0–2.5)
ALBUMIN MSPROF: 3.8 g/dL (ref 3.6–5.1)
ALKALINE PHOSPHATASE (APISO): 57 U/L (ref 33–130)
ALT: 9 U/L (ref 6–29)
AST: 13 U/L (ref 10–35)
BILIRUBIN TOTAL: 0.5 mg/dL (ref 0.2–1.2)
BUN/Creatinine Ratio: 25 (calc) — ABNORMAL HIGH (ref 6–22)
BUN: 25 mg/dL (ref 7–25)
CALCIUM: 9.6 mg/dL (ref 8.6–10.4)
CHLORIDE: 104 mmol/L (ref 98–110)
CO2: 28 mmol/L (ref 20–32)
CREATININE: 1.01 mg/dL — AB (ref 0.60–0.88)
GLOBULIN: 3 g/dL (ref 1.9–3.7)
Glucose, Bld: 83 mg/dL (ref 65–99)
POTASSIUM: 4.1 mmol/L (ref 3.5–5.3)
Sodium: 140 mmol/L (ref 135–146)
Total Protein: 6.8 g/dL (ref 6.1–8.1)

## 2017-01-02 LAB — LIPID PANEL
Cholesterol: 188 mg/dL (ref <200)
HDL: 62 mg/dL (ref >50)
LDL CHOLESTEROL (CALC): 102 mg/dL — AB
Non-HDL Cholesterol (Calc): 126 mg/dL (calc) (ref <130)
TRIGLYCERIDES: 137 mg/dL (ref <150)
Total CHOL/HDL Ratio: 3 (calc) (ref <5.0)

## 2017-01-02 LAB — TSH: TSH: 1.48 m[IU]/L (ref 0.40–4.50)

## 2017-01-02 LAB — CBC WITH DIFFERENTIAL/PLATELET
BASOS PCT: 0.9 %
Basophils Absolute: 70 cells/uL (ref 0–200)
EOS PCT: 3.1 %
Eosinophils Absolute: 242 cells/uL (ref 15–500)
HCT: 34 % — ABNORMAL LOW (ref 35.0–45.0)
Hemoglobin: 11.6 g/dL — ABNORMAL LOW (ref 11.7–15.5)
LYMPHS ABS: 2980 {cells}/uL (ref 850–3900)
MCH: 30.8 pg (ref 27.0–33.0)
MCHC: 34.1 g/dL (ref 32.0–36.0)
MCV: 90.2 fL (ref 80.0–100.0)
MPV: 11 fL (ref 7.5–12.5)
Monocytes Relative: 6.6 %
Neutro Abs: 3994 cells/uL (ref 1500–7800)
Neutrophils Relative %: 51.2 %
PLATELETS: 290 10*3/uL (ref 140–400)
RBC: 3.77 10*6/uL — AB (ref 3.80–5.10)
RDW: 12.3 % (ref 11.0–15.0)
TOTAL LYMPHOCYTE: 38.2 %
WBC: 7.8 10*3/uL (ref 3.8–10.8)
WBCMIX: 515 {cells}/uL (ref 200–950)

## 2017-01-03 NOTE — Telephone Encounter (Signed)
See below please-Heidi Yoder Reasons, RMA  

## 2017-01-03 NOTE — Telephone Encounter (Signed)
Pt called back saying she purchased otc (ranitidine 150mg ).  She is ok with that and does not need anything else.  She did not get the rx for IBS.  If there is something insurance will cover you can send it in  [facility]  Call back [phone]  Barth Kirkseri

## 2017-01-10 NOTE — Telephone Encounter (Signed)
lmtcb-Dequavion Follette V Mishika Flippen, RMA  

## 2017-01-10 NOTE — Telephone Encounter (Signed)
She can try OTC probiotic first in addition to zantac.

## 2017-01-11 NOTE — Telephone Encounter (Signed)
Pt advised-Anastasiya V Hopkins, RMA  

## 2017-01-25 NOTE — Telephone Encounter (Signed)
Visit completed.

## 2017-02-13 ENCOUNTER — Other Ambulatory Visit: Payer: Self-pay | Admitting: Family Medicine

## 2017-03-22 ENCOUNTER — Encounter: Payer: Self-pay | Admitting: Family Medicine

## 2017-03-22 ENCOUNTER — Ambulatory Visit: Payer: Medicare Other | Admitting: Family Medicine

## 2017-03-22 VITALS — BP 114/64 | HR 73 | Temp 98.6°F | Resp 16 | Wt 133.2 lb

## 2017-03-22 DIAGNOSIS — J392 Other diseases of pharynx: Secondary | ICD-10-CM

## 2017-03-22 MED ORDER — SUCRALFATE 1 G PO TABS: 1.0000 g | ORAL_TABLET | Freq: Three times a day (TID) | ORAL | 0 refills | Status: DC

## 2017-03-22 NOTE — Progress Notes (Signed)
Subjective:     Patient ID: Heidi Yoder, female   DOB: 1930-02-11, 83 y.o.   MRN: [id number] Chief Complaint  Patient presents with  . Sore Throat    Patient comes in office today with concerns of irritated throat after ingesting apple cidar vinegar last night. Patient states that she had went to the store earlier yesterday to buy apple cider and didnt realize she was given vinegar, patient statres that she began choking after drinking it last night and was up coughing up mucous.   States her throat is irritated and she has been spitting>coughing over the night. HPI   Review of Systems     Objective:   Physical Exam  Constitutional: She appears well-developed and well-nourished. No distress.  HENT:  No tonsillar enlargement or erythema.  Pulmonary/Chest: Breath sounds normal. She has no wheezes.       Assessment:    1. Throat irritation - sucralfate (CARAFATE) 1 g tablet; Take 1 tablet (1 g total) by mouth 4 (four) times daily -  with meals and at bedtime.  Dispense: 12 tablet; Refill: 0    Plan:    Call for increased cough or fever for consideration of CXR.

## 2017-03-22 NOTE — Patient Instructions (Signed)
Let us know if increased cough or fever.

## 2017-06-26 ENCOUNTER — Ambulatory Visit: Payer: Medicare Other | Admitting: Family Medicine

## 2017-06-26 ENCOUNTER — Encounter: Payer: Self-pay | Admitting: Family Medicine

## 2017-06-26 VITALS — BP 122/64 | HR 64 | Temp 97.9°F | Resp 16 | Wt 129.0 lb

## 2017-06-26 DIAGNOSIS — N393 Stress incontinence (female) (male): Secondary | ICD-10-CM | POA: Diagnosis not present

## 2017-06-26 DIAGNOSIS — I10 Essential (primary) hypertension: Secondary | ICD-10-CM | POA: Diagnosis not present

## 2017-06-26 DIAGNOSIS — E039 Hypothyroidism, unspecified: Secondary | ICD-10-CM | POA: Diagnosis not present

## 2017-06-26 DIAGNOSIS — K219 Gastro-esophageal reflux disease without esophagitis: Secondary | ICD-10-CM

## 2017-06-26 NOTE — Patient Instructions (Addendum)
Discontinue Oxybutynin (for your urine) and Ranitidine (for your reflux).

## 2017-06-26 NOTE — Progress Notes (Signed)
Patient: Heidi Yoder Female    DOB: 02-10-1930   84 y.o.   MRN: [id number] Visit Date: 06/26/2017  Today's Provider: Megan Mansichard Gilbert Jr, MD   Chief Complaint  Patient presents with  . Follow-up   Subjective:    HPI Patient comes in today for a 6 month follow up. She feels well today with no complaints.   Since last visit, patient was advised to stop omeprazole and start Zantac BID. Patient reports that she has noticed a slight improvement in her symptoms. She is also taking the probiotic.  Patient's BP has been stable. She has not noticed any shortness of breath, dizzy spells, or headaches. She is currently taking HTCZ 25mg , and reports good symptom control. She had labs drawn on 01/02/17 and they were WNL.       No Known Allergies   Current Outpatient Medications:  .  aspirin 81 MG tablet, Take 81 mg by mouth daily., Disp: , Rfl:  .  Calcium Carbonate-Vitamin D (CALCIUM HIGH POTENCY/VITAMIN D) 600-200 MG-UNIT TABS, Take by mouth., Disp: , Rfl:  .  CALCIUM-VITAMIN D PO, Take 600 mg by mouth 2 (two) times daily. , Disp: , Rfl:  .  cholecalciferol (VITAMIN D) 1000 UNITS tablet, Take 1,000 Units by mouth daily. , Disp: , Rfl:  .  Cyanocobalamin (VITAMIN B 12 PO), Take 600 mg by mouth daily. , Disp: , Rfl:  .  docusate sodium (STOOL SOFTENER) 100 MG capsule, Take 100 mg by mouth daily., Disp: , Rfl:  .  hydrochlorothiazide (HYDRODIURIL) 25 MG tablet, TAKE 1 TABLET DAILY, Disp: 90 tablet, Rfl: 3 .  hyoscyamine (LEVSIN, ANASPAZ) 0.125 MG tablet, Take 2 tablets (0.25 mg total) by mouth 2 (two) times daily., Disp: 60 tablet, Rfl: 5 .  levothyroxine (SYNTHROID, LEVOTHROID) 50 MCG tablet, TAKE 1 TABLET DAILY, Disp: 90 tablet, Rfl: 3 .  MULTIPLE VITAMIN PO, Take by mouth., Disp: , Rfl:  .  omeprazole (PRILOSEC) 20 MG capsule, Take by mouth., Disp: , Rfl:  .  oxybutynin (DITROPAN) 5 MG tablet, TAKE 1 TABLET TWICE A DAY, Disp: 180 tablet, Rfl: 3 .  ranitidine (ZANTAC) 150 MG  capsule, Take 1 capsule (150 mg total) by mouth 2 (two) times daily., Disp: 60 capsule, Rfl: 11 .  simvastatin (ZOCOR) 20 MG tablet, Take 1 tablet (20 mg total) by mouth daily at 6 PM., Disp: 90 tablet, Rfl: 3 .  sucralfate (CARAFATE) 1 g tablet, Take 1 tablet (1 g total) by mouth 4 (four) times daily -  with meals and at bedtime., Disp: 12 tablet, Rfl: 0  Review of Systems  Constitutional: Negative.   Respiratory: Negative.   Cardiovascular: Negative.  Negative for chest pain, palpitations and leg swelling.  Endocrine: Negative.   Musculoskeletal: Negative.   Skin: Negative.   Neurological: Negative.   Psychiatric/Behavioral: Negative.     Social History   Tobacco Use  . Smoking status: Never Smoker  . Smokeless tobacco: Never Used  Substance Use Topics  . Alcohol use: No   Objective:   BP 122/64 (BP Location: Right Arm, Patient Position: Sitting, Cuff Size: Normal)   Pulse 64   Temp 97.9 F (36.6 C)   Resp 16   Wt 129 lb (58.5 kg) Comment: per patient  BMI 25.19 kg/m  Vitals:   06/26/17 0847  BP: 122/64  Pulse: 64  Resp: 16  Temp: 97.9 F (36.6 C)  Weight: 129 lb (58.5 kg)     Physical Exam  Constitutional: She is oriented to person, place, and time. She appears well-developed and well-nourished.  HENT:  Head: Normocephalic and atraumatic.  Neck: Normal range of motion. Neck supple.  Cardiovascular: Normal rate, regular rhythm and normal heart sounds.  Pulmonary/Chest: Effort normal and breath sounds normal.  Musculoskeletal:  Increased AP diameter of chest.  Neurological: She is alert and oriented to person, place, and time.  Skin: Skin is warm and dry.  Psychiatric: She has a normal mood and affect. Her behavior is normal. Judgment and thought content normal.        Assessment & Plan:     1. Essential (primary) hypertension   2. Hypothyroidism, unspecified type   3. Female stress incontinence   4. Gastroesophageal reflux disease, esophagitis  presence not specified        I have done the exam and reviewed the above chart and it is accurate to the best of my knowledge. Dentist has been used in this note in any air is in the dictation or transcription are unintentional.  Megan Mans, MD  [facility]

## 2017-08-02 ENCOUNTER — Other Ambulatory Visit: Payer: Self-pay | Admitting: Family Medicine

## 2017-08-22 ENCOUNTER — Other Ambulatory Visit: Payer: Self-pay | Admitting: Family Medicine

## 2017-08-22 NOTE — Telephone Encounter (Signed)
Pt contacted office for refill request on the following medications:  levothyroxine (SYNTHROID, LEVOTHROID) 50 MCG tablet  90 day supply   [facility]  Pt stated she is completely out of the medication and is also requesting a 15 day [facility]. Please advise. Thanks TNP

## 2017-10-02 ENCOUNTER — Encounter: Payer: Self-pay | Admitting: Family Medicine

## 2017-10-02 ENCOUNTER — Ambulatory Visit: Payer: Medicare Other | Admitting: Family Medicine

## 2017-10-02 VITALS — BP 122/60 | HR 64 | Temp 98.5°F | Resp 16 | Ht 60.0 in | Wt 137.0 lb

## 2017-10-02 DIAGNOSIS — I10 Essential (primary) hypertension: Secondary | ICD-10-CM | POA: Diagnosis not present

## 2017-10-02 DIAGNOSIS — K219 Gastro-esophageal reflux disease without esophagitis: Secondary | ICD-10-CM | POA: Diagnosis not present

## 2017-10-02 DIAGNOSIS — E039 Hypothyroidism, unspecified: Secondary | ICD-10-CM | POA: Diagnosis not present

## 2017-10-02 NOTE — Progress Notes (Signed)
Patient: Heidi Yoder Female    DOB: 20-Apr-1930   84 y.o.   MRN: [id number] Visit Date: 10/02/2017  Today's Provider: Megan Mans, MD   Chief Complaint  Patient presents with  . Hypertension  . Gastroesophageal Reflux   Subjective:    HPI Patient comes in today for a 6 month follow up. She feels well today with no complaints.  Hypertension Patient reports that BP has been stable. She denies any headaches, dizziness, or chest pain or swelling. She is currently taking HCTZ 25mg  daily, and reports good compliance and good symptom control.  GERD Patient reports that symptoms have been controlled with Zantac BID. She also takes a probiotic.       No Known Allergies   Current Outpatient Medications:  .  ranitidine (ZANTAC) 150 MG capsule, Take 1 capsule (150 mg total) by mouth 2 (two) times daily., Disp: 60 capsule, Rfl: 11 .  aspirin 81 MG tablet, Take 81 mg by mouth daily., Disp: , Rfl:  .  Calcium Carbonate-Vitamin D (CALCIUM HIGH POTENCY/VITAMIN D) 600-200 MG-UNIT TABS, Take by mouth., Disp: , Rfl:  .  CALCIUM-VITAMIN D PO, Take 600 mg by mouth 2 (two) times daily. , Disp: , Rfl:  .  cholecalciferol (VITAMIN D) 1000 UNITS tablet, Take 1,000 Units by mouth daily. , Disp: , Rfl:  .  Cyanocobalamin (VITAMIN B 12 PO), Take 600 mg by mouth daily. , Disp: , Rfl:  .  docusate sodium (STOOL SOFTENER) 100 MG capsule, Take 100 mg by mouth daily., Disp: , Rfl:  .  hydrochlorothiazide (HYDRODIURIL) 25 MG tablet, TAKE 1 TABLET DAILY, Disp: 90 tablet, Rfl: 3 .  hyoscyamine (LEVSIN, ANASPAZ) 0.125 MG tablet, Take 2 tablets (0.25 mg total) by mouth 2 (two) times daily., Disp: 60 tablet, Rfl: 5 .  levothyroxine (SYNTHROID, LEVOTHROID) 50 MCG tablet, TAKE 1 TABLET DAILY, Disp: 90 tablet, Rfl: 3 .  MULTIPLE VITAMIN PO, Take by mouth., Disp: , Rfl:  .  omeprazole (PRILOSEC) 20 MG capsule, Take by mouth., Disp: , Rfl:  .  oxybutynin (DITROPAN) 5 MG tablet, TAKE 1 TABLET TWICE  A DAY, Disp: 180 tablet, Rfl: 3 .  simvastatin (ZOCOR) 20 MG tablet, Take 1 tablet (20 mg total) by mouth daily at 6 PM., Disp: 90 tablet, Rfl: 3 .  sucralfate (CARAFATE) 1 g tablet, Take 1 tablet (1 g total) by mouth 4 (four) times daily -  with meals and at bedtime., Disp: 12 tablet, Rfl: 0  Review of Systems  Constitutional: Negative for activity change, appetite change, chills, diaphoresis, fatigue, fever and unexpected weight change.  HENT: Negative.   Eyes: Negative.   Respiratory: Negative for cough and shortness of breath.   Cardiovascular: Negative for chest pain, palpitations and leg swelling.  Gastrointestinal: Negative for abdominal distention, abdominal pain, anal bleeding, blood in stool, diarrhea, nausea and vomiting.  Endocrine: Negative.   Musculoskeletal: Positive for arthralgias and back pain.  Allergic/Immunologic: Negative for environmental allergies.  Neurological: Negative for dizziness, weakness, light-headedness and headaches.    Social History   Tobacco Use  . Smoking status: Never Smoker  . Smokeless tobacco: Never Used  Substance Use Topics  . Alcohol use: No   Objective:   BP 122/60 (BP Location: Right Arm, Patient Position: Sitting, Cuff Size: Normal)   Pulse 64   Temp 98.5 F (36.9 C)   Resp 16   Ht 5' (1.524 m)   Wt 137 lb (62.1 kg)   BMI  26.76 kg/m  Vitals:   10/02/17 0913  BP: 122/60  Pulse: 64  Resp: 16  Temp: 98.5 F (36.9 C)  Weight: 137 lb (62.1 kg)  Height: 5' (1.524 m)     Physical Exam  Constitutional: She is oriented to person, place, and time. She appears well-developed and well-nourished.  HENT:  Head: Normocephalic and atraumatic.  Right Ear: External ear normal.  Left Ear: External ear normal.  Nose: Nose normal.  Eyes: Conjunctivae are normal. No scleral icterus.  Neck: No thyromegaly present.  Cardiovascular: Normal rate, regular rhythm and normal heart sounds.  Pulmonary/Chest: Effort normal and breath sounds  normal.  Abdominal: Soft.  Musculoskeletal: She exhibits no edema.  Neurological: She is alert and oriented to person, place, and time.  Skin: Skin is warm and dry.  Psychiatric: She has a normal mood and affect. Her behavior is normal. Judgment and thought content normal.        Assessment & Plan:     HTN GERD Chronic Back Pain   RTC 6 months.  I have done the exam and reviewed the above chart and it is accurate to the best of my knowledge. DentistDragon  technology has been used in this note in any air is in the dictation or transcription are unintentional.  Megan Mansichard  Jr, MD  [facility]

## 2018-01-16 ENCOUNTER — Ambulatory Visit (INDEPENDENT_AMBULATORY_CARE_PROVIDER_SITE_OTHER): Payer: Medicare Other

## 2018-01-16 VITALS — BP 132/64 | HR 60 | Temp 98.9°F | Ht 60.0 in | Wt 141.4 lb

## 2018-01-16 DIAGNOSIS — Z23 Encounter for immunization: Secondary | ICD-10-CM | POA: Diagnosis not present

## 2018-01-16 DIAGNOSIS — Z0000 Encounter for general adult medical examination without abnormal findings: Secondary | ICD-10-CM

## 2018-01-16 NOTE — Patient Instructions (Addendum)
Heidi Yoder , Thank you for taking time to come for your Medicare Wellness Visit. I appreciate your ongoing commitment to your health goals. Please review the following plan we discussed and let me know if I can assist you in the future.   Screening recommendations/referrals: Colonoscopy: Up to date Mammogram: Up to date Bone Density: Up to date Recommended yearly ophthalmology/optometry visit for glaucoma screening and checkup Recommended yearly dental visit for hygiene and checkup  Vaccinations: Influenza vaccine: Up to date Pneumococcal vaccine: Up to date Tdap vaccine: Pt declines today.  Shingles vaccine: Pt declines today.     Advanced directives: Please bring a copy of your POA (Power of Attorney) and/or Living Will to your next appointment.   Conditions/risks identified: Recommend to continue trying to increase water intake to 4 glasses a day.   Next appointment: 02/07/18 @ 9:40 AM with Dr Sullivan LoneGilbert.    Preventive Care 3065 Years and Older, Female Preventive care refers to lifestyle choices and visits with your health care provider that can promote health and wellness. What does preventive care include?  A yearly physical exam. This is also called an annual well check.  Dental exams once or twice a year.  Routine eye exams. Ask your health care provider how often you should have your eyes checked.  Personal lifestyle choices, including:  Daily care of your teeth and gums.  Regular physical activity.  Eating a healthy diet.  Avoiding tobacco and drug use.  Limiting alcohol use.  Practicing safe sex.  Taking low-dose aspirin every day.  Taking vitamin and mineral supplements as recommended by your health care provider. What happens during an annual well check? The services and screenings done by your health care provider during your annual well check will depend on your age, overall health, lifestyle risk factors, and family history of disease. Counseling  Your  health care provider may ask you questions about your:  Alcohol use.  Tobacco use.  Drug use.  Emotional well-being.  Home and relationship well-being.  Sexual activity.  Eating habits.  History of falls.  Memory and ability to understand (cognition).  Work and work Astronomerenvironment.  Reproductive health. Screening  You may have the following tests or measurements:  Height, weight, and BMI.  Blood pressure.  Lipid and cholesterol levels. These may be checked every 5 years, or more frequently if you are over 82 years old.  Skin check.  Lung cancer screening. You may have this screening every year starting at age 82 if you have a 30-pack-year history of smoking and currently smoke or have quit within the past 15 years.  Fecal occult blood test (FOBT) of the stool. You may have this test every year starting at age 82.  Flexible sigmoidoscopy or colonoscopy. You may have a sigmoidoscopy every 5 years or a colonoscopy every 10 years starting at age 82.  Hepatitis C blood test.  Hepatitis B blood test.  Sexually transmitted disease (STD) testing.  Diabetes screening. This is done by checking your blood sugar (glucose) after you have not eaten for a while (fasting). You may have this done every 1-3 years.  Bone density scan. This is done to screen for osteoporosis. You may have this done starting at age 82.  Mammogram. This may be done every 1-2 years. Talk to your health care provider about how often you should have regular mammograms. Talk with your health care provider about your test results, treatment options, and if necessary, the need for more tests. Vaccines  Your health care provider may recommend certain vaccines, such as:  Influenza vaccine. This is recommended every year.  Tetanus, diphtheria, and acellular pertussis (Tdap, Td) vaccine. You may need a Td booster every 10 years.  Zoster vaccine. You may need this after age 17.  Pneumococcal 13-valent  conjugate (PCV13) vaccine. One dose is recommended after age 14.  Pneumococcal polysaccharide (PPSV23) vaccine. One dose is recommended after age 55. Talk to your health care provider about which screenings and vaccines you need and how often you need them. This information is not intended to replace advice given to you by your health care provider. Make sure you discuss any questions you have with your health care provider. Document Released: 05/08/2015 Document Revised: 12/30/2015 Document Reviewed: 02/10/2015 Elsevier Interactive Patient Education  2017 [place] Prevention in the Home Falls can cause injuries. They can happen to people of all ages. There are many things you can do to make your home safe and to help prevent falls. What can I do on the outside of my home?  Regularly fix the edges of walkways and driveways and fix any cracks.  Remove anything that might make you trip as you walk through a door, such as a raised step or threshold.  Trim any bushes or trees on the path to your home.  Use bright outdoor lighting.  Clear any walking paths of anything that might make someone trip, such as rocks or tools.  Regularly check to see if handrails are loose or broken. Make sure that both sides of any steps have handrails.  Any raised decks and porches should have guardrails on the edges.  Have any leaves, snow, or ice cleared regularly.  Use sand or salt on walking paths during winter.  Clean up any spills in your garage right away. This includes oil or grease spills. What can I do in the bathroom?  Use night lights.  Install grab bars by the toilet and in the tub and shower. Do not use towel bars as grab bars.  Use non-skid mats or decals in the tub or shower.  If you need to sit down in the shower, use a plastic, non-slip stool.  Keep the floor dry. Clean up any water that spills on the floor as soon as it happens.  Remove soap buildup in the tub or  shower regularly.  Attach bath mats securely with double-sided non-slip rug tape.  Do not have throw rugs and other things on the floor that can make you trip. What can I do in the bedroom?  Use night lights.  Make sure that you have a light by your bed that is easy to reach.  Do not use any sheets or blankets that are too big for your bed. They should not hang down onto the floor.  Have a firm chair that has side arms. You can use this for support while you get dressed.  Do not have throw rugs and other things on the floor that can make you trip. What can I do in the kitchen?  Clean up any spills right away.  Avoid walking on wet floors.  Keep items that you use a lot in easy-to-reach places.  If you need to reach something above you, use a strong step stool that has a grab bar.  Keep electrical cords out of the way.  Do not use floor polish or wax that makes floors slippery. If you must use wax, use non-skid floor wax.  Do  not have throw rugs and other things on the floor that can make you trip. What can I do with my stairs?  Do not leave any items on the stairs.  Make sure that there are handrails on both sides of the stairs and use them. Fix handrails that are broken or loose. Make sure that handrails are as long as the stairways.  Check any carpeting to make sure that it is firmly attached to the stairs. Fix any carpet that is loose or worn.  Avoid having throw rugs at the top or bottom of the stairs. If you do have throw rugs, attach them to the floor with carpet tape.  Make sure that you have a light switch at the top of the stairs and the bottom of the stairs. If you do not have them, ask someone to add them for you. What else can I do to help prevent falls?  Wear shoes that:  Do not have high heels.  Have rubber bottoms.  Are comfortable and fit you well.  Are closed at the toe. Do not wear sandals.  If you use a stepladder:  Make sure that it is fully  opened. Do not climb a closed stepladder.  Make sure that both sides of the stepladder are locked into place.  Ask someone to hold it for you, if possible.  Clearly mark and make sure that you can see:  Any grab bars or handrails.  First and last steps.  Where the edge of each step is.  Use tools that help you move around (mobility aids) if they are needed. These include:  Canes.  Walkers.  Scooters.  Crutches.  Turn on the lights when you go into a dark area. Replace any light bulbs as soon as they burn out.  Set up your furniture so you have a clear path. Avoid moving your furniture around.  If any of your floors are uneven, fix them.  If there are any pets around you, be aware of where they are.  Review your medicines with your doctor. Some medicines can make you feel dizzy. This can increase your chance of falling. Ask your doctor what other things that you can do to help prevent falls. This information is not intended to replace advice given to you by your health care provider. Make sure you discuss any questions you have with your health care provider. Document Released: 02/05/2009 Document Revised: 09/17/2015 Document Reviewed: 05/16/2014 Elsevier Interactive Patient Education  2017 [employer].

## 2018-01-16 NOTE — Progress Notes (Signed)
Subjective:   Heidi Yoder is a 84 y.o. female who presents for Medicare Annual (Subsequent) preventive examination.  Review of Systems:  N/A Cardiac Risk Factors include: advanced age (>103men, >88 women);dyslipidemia;hypertension     Objective:     Vitals: BP 132/64 (BP Location: Right Arm)   Pulse 60   Temp 98.9 F (37.2 C) (Oral)   Ht 5' (1.524 m)   Wt 141 lb 6.4 oz (64.1 kg)   BMI 27.62 kg/m   Body mass index is 27.62 kg/m.  Advanced Directives 01/16/2018 12/29/2016 09/29/2015 04/03/2015 04/03/2015 02/09/2015  Does Patient Have a Medical Advance Directive? Yes Yes Yes Yes No Yes  Type of Advance Directive Living will Living will Healthcare Power of [place];Living will Living will - Living will  Copy of Healthcare Power of Attorney in Chart? - - No - copy requested - - -    Tobacco Social History   Tobacco Use  Smoking Status Never Smoker  Smokeless Tobacco Never Used     Counseling given: Not Answered   Clinical Intake:  Pre-visit preparation completed: Yes  Pain : No/denies pain Pain Score: 0-No pain     Nutritional Status: BMI 25 -29 Overweight Nutritional Risks: None Diabetes: No  How often do you need to have someone help you when you read instructions, pamphlets, or other written materials from your doctor or pharmacy?: 1 - Never  Interpreter Needed?: No  Information entered by :: [facility], LPN  Past Medical History:  Diagnosis Date  . GERD (gastroesophageal reflux disease)   . Hypertension   . Hypothyroidism   . Peripheral neuropathy    Past Surgical History:  Procedure Laterality Date  . ABDOMINAL HYSTERECTOMY     due to cervical hyperlasia  . BACK SURGERY     spinal stenosis and back surgery  . CATARACT EXTRACTION Bilateral   . HIP PINNING,CANNULATED Left 04/04/2015   Procedure: CANNULATED HIP PINNING;  Surgeon: Deeann Saint, MD;  Location: [facility] ORS;  Service: Orthopedics;  Laterality: Left;  . UPPER GI ENDOSCOPY  10/17/03   normal duodenum, normal esophagus, normal stomach   Family History  Problem Relation Age of Onset  . Ovarian cancer Mother   . Hypertension Sister   . Colon polyps Sister   . Cancer Sister        breast  . Heart disease Brother   . Colon polyps Brother   . CVA Father    Social History   Socioeconomic History  . Marital status: Married    Spouse name: Not on file  . Number of children: 2  . Years of education: Not on file  . Highest education level: High school graduate  Occupational History  . Not on file  Social Needs  . Financial resource strain: Not hard at all  . Food insecurity:    Worry: Never true    Inability: Never true  . Transportation needs:    Medical: No    Non-medical: No  Tobacco Use  . Smoking status: Never Smoker  . Smokeless tobacco: Never Used  Substance and Sexual Activity  . Alcohol use: No  . Drug use: No  . Sexual activity: Not on file  Lifestyle  . Physical activity:    Days per week: Not on file    Minutes per session: Not on file  . Stress: Only a little  Relationships  . Social connections:    Talks on phone: Not on file    Gets together: Not on file  Attends religious service: Not on file    Active member of club or organization: Not on file    Attends meetings of clubs or organizations: Not on file    Relationship status: Not on file  Other Topics Concern  . Not on file  Social History Narrative  . Not on file    Outpatient Encounter Medications as of 01/16/2018  Medication Sig  . aspirin 81 MG tablet Take 81 mg by mouth daily.  . Calcium Carbonate-Vitamin D (CALCIUM HIGH POTENCY/VITAMIN D) 600-200 MG-UNIT TABS Take by mouth.  Marland Kitchen. CALCIUM-VITAMIN D PO Take 600 mg by mouth 2 (two) times daily.   . cholecalciferol (VITAMIN D) 1000 UNITS tablet Take 1,000 Units by mouth daily.   . Cyanocobalamin (VITAMIN B 12 PO) Take 600 mg by mouth daily.   Marland Kitchen. docusate sodium (STOOL SOFTENER) 100 MG capsule Take 200 mg by mouth daily.   .  hydrochlorothiazide (HYDRODIURIL) 25 MG tablet TAKE 1 TABLET DAILY  . levothyroxine (SYNTHROID, LEVOTHROID) 50 MCG tablet TAKE 1 TABLET DAILY  . MULTIPLE VITAMIN PO Take by mouth.  . simvastatin (ZOCOR) 20 MG tablet Take 1 tablet (20 mg total) by mouth daily at 6 PM.  . hyoscyamine (LEVSIN, ANASPAZ) 0.125 MG tablet Take 2 tablets (0.25 mg total) by mouth 2 (two) times daily.  Marland Kitchen. omeprazole (PRILOSEC) 20 MG capsule Take 20 mg by mouth daily.   Marland Kitchen. oxybutynin (DITROPAN) 5 MG tablet TAKE 1 TABLET TWICE A DAY  . ranitidine (ZANTAC) 150 MG capsule Take 1 capsule (150 mg total) by mouth 2 (two) times daily.  . sucralfate (CARAFATE) 1 g tablet Take 1 tablet (1 g total) by mouth 4 (four) times daily -  with meals and at bedtime.   No facility-administered encounter medications on file as of 01/16/2018.     Activities of Daily Living In your present state of health, do you have any difficulty performing the following activities: 01/16/2018  Hearing? N  Vision? N  Difficulty concentrating or making decisions? N  Walking or climbing stairs? N  Dressing or bathing? N  Doing errands, shopping? N  Preparing Food and eating ? N  Using the Toilet? N  In the past six months, have you accidently leaked urine? Y  Comment Wears protection at all times.   Do you have problems with loss of bowel control? N  Managing your Medications? N  Managing your Finances? N  Housekeeping or managing your Housekeeping? N  Some recent data might be hidden    Patient Care Team: Maple HudsonGilbert, Richard L Jr., MD as PCP - General (Family Medicine)    Assessment:   This is a routine wellness examination for Heidi Yoder.  Exercise Activities and Dietary recommendations Current Exercise Habits: The patient does not participate in regular exercise at present, Exercise limited by: orthopedic condition(s)  Goals    . Increase water intake     Recommend increasing water intake to 4 glasses a day.        Fall Risk Fall Risk   01/16/2018 12/29/2016 12/16/2015 08/10/2015  Falls in the past year? No No Yes Yes  Number falls in past yr: - - 1 1  Injury with Fall? - - Yes Yes   Is the patient's home free of loose throw rugs in walkways, pet beds, electrical cords, etc?   yes      Grab bars in the bathroom? yes      Handrails on the stairs?   no  Adequate lighting?   yes  Timed Get Up and Go performed: N/A  Depression Screen PHQ 2/9 Scores 01/16/2018 01/16/2018 12/29/2016 12/16/2015  PHQ - 2 Score 0 0 0 0  PHQ- 9 Score 1 - 0 -     Cognitive Function     6CIT Screen 01/16/2018 12/29/2016  What Year? 0 points 0 points  What month? 0 points 0 points  What time? 0 points 0 points  Count back from 20 0 points 0 points  Months in reverse 0 points 0 points  Repeat phrase 4 points 0 points  Total Score 4 0    Immunization History  Administered Date(s) Administered  . Influenza, High Dose Seasonal PF 02/09/2015, 12/29/2016, 01/16/2018  . Pneumococcal Conjugate-13 08/07/2013  . Pneumococcal Polysaccharide-23 05/03/2000  . Td 12/18/2003    Qualifies for Shingles Vaccine? Due for Shingles vaccine. Declined my offer to administer today. Education has been provided regarding the importance of this vaccine. Pt has been advised to call her insurance company to determine her out of pocket expense. Advised she may also receive this vaccine at her local pharmacy or Health Dept. Verbalized acceptance and understanding.  Screening Tests Health Maintenance  Topic Date Due  . TETANUS/TDAP  12/17/2013  . INFLUENZA VACCINE  Completed  . DEXA SCAN  Completed  . PNA vac Low Risk Adult  Completed    Cancer Screenings: Lung: Low Dose CT Chest recommended if Age 42-80 years, 30 pack-year currently smoking OR have quit w/in 15years. Patient does not qualify. Breast:  Up to date on Mammogram? Yes   Up to date of Bone Density/Dexa? Yes Colorectal: Up to date  Additional Screenings:  Hepatitis C Screening: Up to date     Plan:   I have personally reviewed and addressed the Medicare Annual Wellness questionnaire and have noted the following in the patient's chart:  A. Medical and social history B. Use of alcohol, tobacco or illicit drugs  C. Current medications and supplements D. Functional ability and status E.  Nutritional status F.  Physical activity G. Advance directives H. List of other physicians I.  Hospitalizations, surgeries, and ER visits in previous 12 months J.  Vitals K. Screenings such as hearing and vision if needed, cognitive and depression L. Referrals and appointments - none  In addition, I have reviewed and discussed with patient certain preventive protocols, quality metrics, and best practice recommendations. A written personalized care plan for preventive services as well as general preventive health recommendations were provided to patient.  See attached scanned questionnaire for additional information.   Signed,  Hyacinth Meeker, LPN Nurse Health Advisor   Nurse Recommendations: Pt declined the tetanus vaccine today.

## 2018-02-07 ENCOUNTER — Ambulatory Visit (INDEPENDENT_AMBULATORY_CARE_PROVIDER_SITE_OTHER): Payer: Medicare Other | Admitting: Family Medicine

## 2018-02-07 VITALS — BP 124/68 | HR 60 | Temp 98.6°F | Resp 16 | Wt 141.0 lb

## 2018-02-07 DIAGNOSIS — E039 Hypothyroidism, unspecified: Secondary | ICD-10-CM | POA: Diagnosis not present

## 2018-02-07 DIAGNOSIS — Z0000 Encounter for general adult medical examination without abnormal findings: Secondary | ICD-10-CM

## 2018-02-07 DIAGNOSIS — E7800 Pure hypercholesterolemia, unspecified: Secondary | ICD-10-CM | POA: Diagnosis not present

## 2018-02-07 DIAGNOSIS — I10 Essential (primary) hypertension: Secondary | ICD-10-CM | POA: Diagnosis not present

## 2018-02-07 NOTE — Progress Notes (Signed)
Patient: Heidi Yoder, Female    DOB: 10-16-1929, 84 y.o.   MRN: [id number] Visit Date: 02/07/2018  Today's Provider: Megan Mans, MD   Chief Complaint  Patient presents with  . Annual Wellness Exam   Subjective:  Patient saw [name] for AWE on 01/16/2018.   Annual Physical Heidi Yoder is a 84 y.o. female. She feels well. She reports exercising not regularly. She reports she is sleeping well.  Colonoscopy- 12/17/2010. Dr. Mechele Collin. Diverticulosis. Tubular adenoma. Repeat in 3 years. Mammogram- 12/29/2015. Normal. BMD- 08/25/2015. Osteopenia.      Review of Systems  Constitutional: Positive for fatigue.       Chronic fatigue.  HENT: Negative.   Eyes: Negative.   Respiratory: Negative.   Cardiovascular: Negative.   Gastrointestinal: Negative.   Endocrine: Negative.   Genitourinary: Negative.   Musculoskeletal: Negative.   Skin: Negative.   Allergic/Immunologic: Negative.   Neurological: Negative.   Hematological: Negative.   Psychiatric/Behavioral: Negative.     Social History   Socioeconomic History  . Marital status: Married    Spouse name: Not on file  . Number of children: 2  . Years of education: Not on file  . Highest education level: High school graduate  Occupational History  . Not on file  Social Needs  . Financial resource strain: Not hard at all  . Food insecurity:    Worry: Never true    Inability: Never true  . Transportation needs:    Medical: No    Non-medical: No  Tobacco Use  . Smoking status: Never Smoker  . Smokeless tobacco: Never Used  Substance and Sexual Activity  . Alcohol use: No  . Drug use: No  . Sexual activity: Not on file  Lifestyle  . Physical activity:    Days per week: Not on file    Minutes per session: Not on file  . Stress: Only a little  Relationships  . Social connections:    Talks on phone: Not on file    Gets together: Not on file    Attends religious service: Not on file    Active  member of club or organization: Not on file    Attends meetings of clubs or organizations: Not on file    Relationship status: Not on file  . Intimate partner violence:    Fear of current or ex partner: Not on file    Emotionally abused: Not on file    Physically abused: Not on file    Forced sexual activity: Not on file  Other Topics Concern  . Not on file  Social History Narrative  . Not on file    Past Medical History:  Diagnosis Date  . GERD (gastroesophageal reflux disease)   . Hypertension   . Hypothyroidism   . Peripheral neuropathy      Patient Active Problem List   Diagnosis Date Noted  . Closed left hip fracture (HCC) 04/03/2015  . Allergic rhinitis 02/06/2015  . Back pain, chronic 02/06/2015  . CAFL (chronic airflow limitation) (HCC) 02/06/2015  . Essential (primary) hypertension 02/06/2015  . GERD (gastroesophageal reflux disease) 02/06/2015  . History of colon polyps 02/06/2015  . Hypercholesteremia 02/06/2015  . Hypothyroidism 02/06/2015  . Arthritis, degenerative 02/06/2015  . Neuropathy 02/06/2015  . Osteopenia 02/06/2015  . Post menopausal syndrome 02/06/2015  . Scoliosis 02/06/2015  . Female stress incontinence 02/06/2015  . B12 deficiency 02/06/2015  . Avitaminosis D 02/06/2015    Past Surgical History:  Procedure Laterality Date  . ABDOMINAL HYSTERECTOMY     due to cervical hyperlasia  . BACK SURGERY     spinal stenosis and back surgery  . CATARACT EXTRACTION Bilateral   . HIP PINNING,CANNULATED Left 04/04/2015   Procedure: CANNULATED HIP PINNING;  Surgeon: Deeann Saint, MD;  Location: [facility] ORS;  Service: Orthopedics;  Laterality: Left;  . UPPER GI ENDOSCOPY  10/17/03   normal duodenum, normal esophagus, normal stomach    Her family history includes CVA in her father; Cancer in her sister; Colon polyps in her brother and sister; Heart disease in her brother; Hypertension in her sister; Ovarian cancer in her mother.      Current Outpatient  Medications:  .  aspirin 81 MG tablet, Take 81 mg by mouth daily., Disp: , Rfl:  .  Calcium Carbonate-Vitamin D (CALCIUM HIGH POTENCY/VITAMIN D) 600-200 MG-UNIT TABS, Take by mouth., Disp: , Rfl:  .  CALCIUM-VITAMIN D PO, Take 600 mg by mouth 2 (two) times daily. , Disp: , Rfl:  .  cholecalciferol (VITAMIN D) 1000 UNITS tablet, Take 1,000 Units by mouth daily. , Disp: , Rfl:  .  Cyanocobalamin (VITAMIN B 12 PO), Take 600 mg by mouth daily. , Disp: , Rfl:  .  docusate sodium (STOOL SOFTENER) 100 MG capsule, Take 200 mg by mouth daily. , Disp: , Rfl:  .  hydrochlorothiazide (HYDRODIURIL) 25 MG tablet, TAKE 1 TABLET DAILY, Disp: 90 tablet, Rfl: 3 .  levothyroxine (SYNTHROID, LEVOTHROID) 50 MCG tablet, TAKE 1 TABLET DAILY, Disp: 90 tablet, Rfl: 3 .  MULTIPLE VITAMIN PO, Take by mouth., Disp: , Rfl:  .  omeprazole (PRILOSEC) 20 MG capsule, Take 20 mg by mouth daily. , Disp: , Rfl:  .  ranitidine (ZANTAC) 150 MG capsule, Take 1 capsule (150 mg total) by mouth 2 (two) times daily., Disp: 60 capsule, Rfl: 11 .  simvastatin (ZOCOR) 20 MG tablet, Take 1 tablet (20 mg total) by mouth daily at 6 PM., Disp: 90 tablet, Rfl: 3 .  sucralfate (CARAFATE) 1 g tablet, Take 1 tablet (1 g total) by mouth 4 (four) times daily -  with meals and at bedtime., Disp: 12 tablet, Rfl: 0 .  hyoscyamine (LEVSIN, ANASPAZ) 0.125 MG tablet, Take 2 tablets (0.25 mg total) by mouth 2 (two) times daily. (Patient not taking: Reported on 02/07/2018), Disp: 60 tablet, Rfl: 5 .  oxybutynin (DITROPAN) 5 MG tablet, TAKE 1 TABLET TWICE A DAY (Patient not taking: Reported on 02/07/2018), Disp: 180 tablet, Rfl: 3  Patient Care Team: Maple Hudson., MD as PCP - General (Family Medicine)     Objective:   Vitals: BP 124/68 (BP Location: Left Arm)   Pulse 60   Temp 98.6 F (37 C)   Resp 16   Wt 141 lb (64 kg)   SpO2 97%   BMI 27.54 kg/m   Physical Exam  Constitutional: She is oriented to person, place, and time. She appears  well-developed and well-nourished.  HENT:  Head: Normocephalic and atraumatic.  Right Ear: External ear normal.  Left Ear: External ear normal.  Nose: Nose normal.  Mouth/Throat: Oropharynx is clear and moist.  Eyes: Conjunctivae are normal. No scleral icterus.  Right pupil dilated,irregular.  Cardiovascular: Normal rate, regular rhythm, normal heart sounds and intact distal pulses.  Pulmonary/Chest: Effort normal and breath sounds normal.  Abdominal: Soft.  Musculoskeletal: She exhibits no edema.  Significant thoracic kyphosis.  Neurological: She is alert and oriented to person, place, and time.  Skin:  Skin is warm and dry.  Psychiatric: She has a normal mood and affect. Her behavior is normal. Judgment and thought content normal.    Activities of Daily Living In your present state of health, do you have any difficulty performing the following activities: 01/16/2018  Hearing? N  Vision? N  Difficulty concentrating or making decisions? N  Walking or climbing stairs? N  Dressing or bathing? N  Doing errands, shopping? N  Preparing Food and eating ? N  Using the Toilet? N  In the past six months, have you accidently leaked urine? Y  Comment Wears protection at all times.   Do you have problems with loss of bowel control? N  Managing your Medications? N  Managing your Finances? N  Housekeeping or managing your Housekeeping? N  Some recent data might be hidden    Fall Risk Assessment Fall Risk  01/16/2018 12/29/2016 12/16/2015 08/10/2015  Falls in the past year? No No Yes Yes  Number falls in past yr: - - 1 1  Injury with Fall? - - Yes Yes     Depression Screen PHQ 2/9 Scores 01/16/2018 01/16/2018 12/29/2016 12/16/2015  PHQ - 2 Score 0 0 0 0  PHQ- 9 Score 1 - 0 -    Cognitive Testing - 6-CIT 6CIT Screen 01/16/2018 12/29/2016  What Year? 0 points 0 points  What month? 0 points 0 points  What time? 0 points 0 points  Count back from 20 0 points 0 points  Months in reverse 0  points 0 points  Repeat phrase 4 points 0 points  Total Score 4 0         Assessment & Plan:     Annual Wellness Visit  Reviewed patient's Family Medical History Reviewed and updated list of patient's medical providers Assessment of cognitive impairment was done Assessed patient's functional ability Established a written schedule for health screening services Health Risk Assessent Completed and Reviewed  Exercise Activities and Dietary recommendations Goals    . Increase water intake     Recommend increasing water intake to 4 glasses a day.        Immunization History  Administered Date(s) Administered  . Influenza, High Dose Seasonal PF 02/09/2015, 12/29/2016, 01/16/2018  . Pneumococcal Conjugate-13 08/07/2013  . Pneumococcal Polysaccharide-23 05/03/2000  . Td 12/18/2003    Health Maintenance  Topic Date Due  . TETANUS/TDAP  12/17/2013  . INFLUENZA VACCINE  Completed  . DEXA SCAN  Completed  . PNA vac Low Risk Adult  Completed    No colonoscopy due to age. Pt wishes to have mammogram. Discussed health benefits of physical activity, and encouraged her to engage in regular exercise appropriate for her age and condition.  1. Annual Physical Limited screening exams at age 73.  2. Essential (primary) hypertension She is tolerating meds. - CBC with Differential/Platelet - Comprehensive metabolic panel  3. Hypothyroidism, unspecified type  - TSH  4. Hypercholesteremia  - Lipid panel 5.Mild Chronic Fatigue   I have done the exam and reviewed the above chart and it is accurate to the best of my knowledge. Dentist has been used in this note in any air is in the dictation or transcription are unintentional.  Megan Mans, MD  [facility]

## 2018-02-08 ENCOUNTER — Telehealth: Payer: Self-pay

## 2018-02-08 LAB — CBC WITH DIFFERENTIAL/PLATELET
Basophils Absolute: 0.1 10*3/uL (ref 0.0–0.2)
Basos: 1 %
EOS (ABSOLUTE): 0.2 10*3/uL (ref 0.0–0.4)
EOS: 2 %
HEMATOCRIT: 34.8 % (ref 34.0–46.6)
HEMOGLOBIN: 11.4 g/dL (ref 11.1–15.9)
Immature Grans (Abs): 0 10*3/uL (ref 0.0–0.1)
Immature Granulocytes: 0 %
LYMPHS ABS: 3.4 10*3/uL — AB (ref 0.7–3.1)
Lymphs: 36 %
MCH: 30.9 pg (ref 26.6–33.0)
MCHC: 32.8 g/dL (ref 31.5–35.7)
MCV: 94 fL (ref 79–97)
MONOCYTES: 6 %
Monocytes Absolute: 0.6 10*3/uL (ref 0.1–0.9)
NEUTROS ABS: 5.2 10*3/uL (ref 1.4–7.0)
Neutrophils: 55 %
Platelets: 263 10*3/uL (ref 150–450)
RBC: 3.69 x10E6/uL — AB (ref 3.77–5.28)
RDW: 11.9 % — ABNORMAL LOW (ref 12.3–15.4)
WBC: 9.5 10*3/uL (ref 3.4–10.8)

## 2018-02-08 LAB — COMPREHENSIVE METABOLIC PANEL
ALBUMIN: 4.2 g/dL (ref 3.5–4.7)
ALK PHOS: 58 IU/L (ref 39–117)
ALT: 13 IU/L (ref 0–32)
AST: 16 IU/L (ref 0–40)
Albumin/Globulin Ratio: 1.6 (ref 1.2–2.2)
BUN / CREAT RATIO: 25 (ref 12–28)
BUN: 26 mg/dL (ref 8–27)
Bilirubin Total: 0.3 mg/dL (ref 0.0–1.2)
CO2: 27 mmol/L (ref 20–29)
CREATININE: 1.06 mg/dL — AB (ref 0.57–1.00)
Calcium: 10.9 mg/dL — ABNORMAL HIGH (ref 8.7–10.3)
Chloride: 100 mmol/L (ref 96–106)
GFR, EST AFRICAN AMERICAN: 55 mL/min/{1.73_m2} — AB (ref >59)
GFR, EST NON AFRICAN AMERICAN: 48 mL/min/{1.73_m2} — AB (ref >59)
Globulin, Total: 2.7 g/dL (ref 1.5–4.5)
Glucose: 82 mg/dL (ref 65–99)
Potassium: 3.7 mmol/L (ref 3.5–5.2)
Sodium: 142 mmol/L (ref 134–144)
TOTAL PROTEIN: 6.9 g/dL (ref 6.0–8.5)

## 2018-02-08 LAB — LIPID PANEL
CHOL/HDL RATIO: 3 ratio (ref 0.0–4.4)
CHOLESTEROL TOTAL: 195 mg/dL (ref 100–199)
HDL: 65 mg/dL (ref >39)
LDL CALC: 91 mg/dL (ref 0–99)
Triglycerides: 196 mg/dL — ABNORMAL HIGH (ref 0–149)
VLDL CHOLESTEROL CAL: 39 mg/dL (ref 5–40)

## 2018-02-08 LAB — TSH: TSH: 3.36 u[IU]/mL (ref 0.450–4.500)

## 2018-02-08 NOTE — Telephone Encounter (Signed)
Pt advised.  Apt made for 04/11/2018  Thanks,   -Vernona Rieger

## 2018-02-08 NOTE — Telephone Encounter (Signed)
-----   Message from Maple Hudson., MD sent at 02/08/2018  8:49 AM EDT ----- Lab ok but for Calcium--stop any supplement and RTC 2 months--will get Ca,Ionized Ca,PTH.

## 2018-02-19 ENCOUNTER — Other Ambulatory Visit: Payer: Self-pay | Admitting: Family Medicine

## 2018-04-11 ENCOUNTER — Ambulatory Visit: Payer: Medicare Other | Admitting: Family Medicine

## 2018-04-11 DIAGNOSIS — K219 Gastro-esophageal reflux disease without esophagitis: Secondary | ICD-10-CM

## 2018-04-11 DIAGNOSIS — E039 Hypothyroidism, unspecified: Secondary | ICD-10-CM

## 2018-04-11 DIAGNOSIS — M8588 Other specified disorders of bone density and structure, other site: Secondary | ICD-10-CM

## 2018-04-11 DIAGNOSIS — G8929 Other chronic pain: Secondary | ICD-10-CM

## 2018-04-11 DIAGNOSIS — M545 Low back pain: Secondary | ICD-10-CM

## 2018-04-11 DIAGNOSIS — E559 Vitamin D deficiency, unspecified: Secondary | ICD-10-CM

## 2018-04-11 NOTE — Progress Notes (Signed)
Heidi Yoder  MRN: [id number] DOB: 11/03/1929  Subjective:  HPI   The patient is an 84 year old female who presents for follow up on elevated Calcium level done on lab from her last visit.   chronic health.  She was last seen on 02/07/18 for her annual exam.   Elevated calcium level discussed with the patient.  Patient Active Problem List   Diagnosis Date Noted  . Closed left hip fracture (HCC) 04/03/2015  . Allergic rhinitis 02/06/2015  . Back pain, chronic 02/06/2015  . CAFL (chronic airflow limitation) (HCC) 02/06/2015  . Essential (primary) hypertension 02/06/2015  . GERD (gastroesophageal reflux disease) 02/06/2015  . History of colon polyps 02/06/2015  . Hypercholesteremia 02/06/2015  . Hypothyroidism 02/06/2015  . Arthritis, degenerative 02/06/2015  . Neuropathy 02/06/2015  . Osteopenia 02/06/2015  . Post menopausal syndrome 02/06/2015  . Scoliosis 02/06/2015  . Female stress incontinence 02/06/2015  . B12 deficiency 02/06/2015  . Avitaminosis D 02/06/2015    Past Medical History:  Diagnosis Date  . GERD (gastroesophageal reflux disease)   . Hypertension   . Hypothyroidism   . Peripheral neuropathy     Social History   Socioeconomic History  . Marital status: Married    Spouse name: Not on file  . Number of children: 2  . Years of education: Not on file  . Highest education level: High school graduate  Occupational History  . Not on file  Social Needs  . Financial resource strain: Not hard at all  . Food insecurity:    Worry: Never true    Inability: Never true  . Transportation needs:    Medical: No    Non-medical: No  Tobacco Use  . Smoking status: Never Smoker  . Smokeless tobacco: Never Used  Substance and Sexual Activity  . Alcohol use: No  . Drug use: No  . Sexual activity: Not on file  Lifestyle  . Physical activity:    Days per week: Not on file    Minutes per session: Not on file  . Stress: Only a little  Relationships  .  Social connections:    Talks on phone: Not on file    Gets together: Not on file    Attends religious service: Not on file    Active member of club or organization: Not on file    Attends meetings of clubs or organizations: Not on file    Relationship status: Not on file  . Intimate partner violence:    Fear of current or ex partner: Not on file    Emotionally abused: Not on file    Physically abused: Not on file    Forced sexual activity: Not on file  Other Topics Concern  . Not on file  Social History Narrative  . Not on file    Outpatient Encounter Medications as of 04/11/2018  Medication Sig  . levothyroxine (SYNTHROID, LEVOTHROID) 50 MCG tablet TAKE 1 TABLET DAILY  . MULTIPLE VITAMIN PO Take by mouth.  Marland Kitchen omeprazole (PRILOSEC) 20 MG capsule Take 20 mg by mouth daily.   . simvastatin (ZOCOR) 20 MG tablet TAKE 1 TABLET DAILY AT 6 P.M.  . aspirin 81 MG tablet Take 81 mg by mouth daily.  . Calcium Carbonate-Vitamin D (CALCIUM HIGH POTENCY/VITAMIN D) 600-200 MG-UNIT TABS Take by mouth.  Marland Kitchen CALCIUM-VITAMIN D PO Take 600 mg by mouth 2 (two) times daily.   . cholecalciferol (VITAMIN D) 1000 UNITS tablet Take 1,000 Units by mouth daily.   Marland Kitchen  Cyanocobalamin (VITAMIN B 12 PO) Take 600 mg by mouth daily.   Marland Kitchen. docusate sodium (STOOL SOFTENER) 100 MG capsule Take 200 mg by mouth daily.   . hydrochlorothiazide (HYDRODIURIL) 25 MG tablet TAKE 1 TABLET DAILY  . hyoscyamine (LEVSIN, ANASPAZ) 0.125 MG tablet Take 2 tablets (0.25 mg total) by mouth 2 (two) times daily. (Patient not taking: Reported on 02/07/2018)  . oxybutynin (DITROPAN) 5 MG tablet TAKE 1 TABLET TWICE A DAY (Patient not taking: Reported on 02/07/2018)  . ranitidine (ZANTAC) 150 MG capsule Take 1 capsule (150 mg total) by mouth 2 (two) times daily. (Patient not taking: Reported on 04/11/2018)  . sucralfate (CARAFATE) 1 g tablet Take 1 tablet (1 g total) by mouth 4 (four) times daily -  with meals and at bedtime.   No  facility-administered encounter medications on file as of 04/11/2018.     No Known Allergies  Review of Systems  Constitutional: Negative for fever and malaise/fatigue.  Eyes: Negative.   Respiratory: Negative for cough, shortness of breath and wheezing.   Cardiovascular: Negative for chest pain, palpitations and leg swelling.  Gastrointestinal: Negative.   Skin: Negative.   Neurological: Negative for dizziness.  Endo/Heme/Allergies: Negative.   Psychiatric/Behavioral: Negative.     Objective:  BP (!) 147/78 (BP Location: Right Arm, Patient Position: Sitting, Cuff Size: Normal)   Pulse 64   Temp 97.7 F (36.5 C) (Oral)   Resp 16   Physical Exam  Constitutional: She is oriented to person, place, and time and well-developed, well-nourished, and in no distress.  HENT:  Head: Normocephalic and atraumatic.  Right Ear: External ear normal.  Left Ear: External ear normal.  Nose: Nose normal.  Eyes: Conjunctivae are normal. No scleral icterus.  Neck: No thyromegaly present.  Cardiovascular: Normal rate, regular rhythm and normal heart sounds.  Pulmonary/Chest: Effort normal and breath sounds normal.  Abdominal: Soft.  Neurological: She is alert and oriented to person, place, and time. Gait normal. GCS score is 15.  Skin: Skin is warm and dry.  Psychiatric: Mood, memory, affect and judgment normal.    Assessment and Plan :  1. Serum calcium elevated Low up if elevated.  Otherwise we will leave this alone. - PTH, intact and calcium - Calcium, ionized  2. Gastroesophageal reflux disease, esophagitis presence not specified   3. Hypothyroidism, unspecified type   4. Osteopenia of lumbar spine   5. Avitaminosis D   6. Chronic bilateral low back pain without sciatica  I have done the exam and reviewed the chart and it is accurate to the best of my knowledge. DentistDragon  technology has been used and  any errors in dictation or transcription are unintentional. Julieanne Mansonichard   M.D. [facility]

## 2018-04-12 ENCOUNTER — Telehealth: Payer: Self-pay

## 2018-04-12 LAB — CALCIUM, IONIZED: CALCIUM ION: 5.1 mg/dL (ref 4.5–5.6)

## 2018-04-12 LAB — PTH, INTACT AND CALCIUM
Calcium: 9.4 mg/dL (ref 8.7–10.3)
PTH: 21 pg/mL (ref 15–65)

## 2018-04-12 NOTE — Telephone Encounter (Signed)
Advised patient as below.  

## 2018-04-12 NOTE — Telephone Encounter (Signed)
Left message to call back  

## 2018-04-12 NOTE — Telephone Encounter (Signed)
-----   Message from Maple Hudsonichard L Gilbert Jr., MD sent at 04/12/2018  8:35 AM EST ----- Back top normal.

## 2018-08-01 ENCOUNTER — Ambulatory Visit: Payer: Self-pay | Admitting: Family Medicine

## 2018-08-23 ENCOUNTER — Other Ambulatory Visit: Payer: Self-pay | Admitting: Family Medicine

## 2018-09-25 ENCOUNTER — Other Ambulatory Visit: Payer: Self-pay | Admitting: Family Medicine

## 2018-09-25 NOTE — Telephone Encounter (Signed)
Please review

## 2018-10-01 ENCOUNTER — Telehealth: Payer: Self-pay | Admitting: Family Medicine

## 2018-10-01 NOTE — Telephone Encounter (Signed)
Pt has an appt Wednesday.  She has had a little cough, congestion and watery eyes.  She thinks it is allergies.  No fever  I thought someone may want to call her back before coming in [place].  she does not want to do a virtual appt  Thanks teri

## 2018-10-01 NOTE — Telephone Encounter (Signed)
Zyrtec 10 mg daily

## 2018-10-01 NOTE — Telephone Encounter (Signed)
Please review. Any recommendations?  

## 2018-10-02 NOTE — Telephone Encounter (Signed)
Left message for patient to call back regarding message

## 2018-10-02 NOTE — Telephone Encounter (Signed)
Advised patient as below.  

## 2018-10-03 ENCOUNTER — Ambulatory Visit: Payer: Self-pay | Admitting: Family Medicine

## 2019-01-18 ENCOUNTER — Ambulatory Visit: Payer: Medicare Other

## 2019-01-21 NOTE — Progress Notes (Signed)
Subjective:   Heidi Yoder is a 85 y.o. female who presents for Medicare Annual (Subsequent) preventive examination.   This visit is being conducted through telemedicine due to the COVID-19 pandemic. This patient has given me verbal consent via doximity to conduct this visit, patient states they are participating from their home address. Some vital signs may be absent or patient reported.    Patient identification: identified by name, DOB, and current address   Review of Systems:  N/A Cardiac Risk Factors include: advanced age (>2355men, 71>65 women);dyslipidemia;hypertension     Objective:     Vitals: There were no vitals taken for this visit.  There is no height or weight on file to calculate BMI. Unable to obtain vitals due to visit being conducted via telephonically.   Advanced Directives 01/22/2019 01/16/2018 12/29/2016 09/29/2015 04/03/2015 04/03/2015 02/09/2015  Does Patient Have a Medical Advance Directive? Yes Yes Yes Yes Yes No Yes  Type of Estate agentAdvance Directive Healthcare Power of ShellmanAttorney;Living will Living will Living will Healthcare Power of [place]Attorney;Living will Living will - Living will  Copy of Healthcare Power of Attorney in Chart? No - copy requested - - No - copy requested - - -    Tobacco Social History   Tobacco Use  Smoking Status Never Smoker  Smokeless Tobacco Never Used     Counseling given: Not Answered   Clinical Intake:  Pre-visit preparation completed: Yes  Pain : No/denies pain Pain Score: 0-No pain     Nutritional Risks: None Diabetes: No  How often do you need to have someone help you when you read instructions, pamphlets, or other written materials from your doctor or pharmacy?: 1 - Never  Interpreter Needed?: No  Information entered by :: [facility] CenterMmarkoski, LPN  Past Medical History:  Diagnosis Date  . GERD (gastroesophageal reflux disease)   . Hypertension   . Hypothyroidism   . Peripheral neuropathy    Past Surgical History:  Procedure  Laterality Date  . ABDOMINAL HYSTERECTOMY     due to cervical hyperlasia  . BACK SURGERY     spinal stenosis and back surgery  . CATARACT EXTRACTION Bilateral   . HIP PINNING,CANNULATED Left 04/04/2015   Procedure: CANNULATED HIP PINNING;  Surgeon: Deeann SaintHoward Miller, MD;  Location: [facility] ORS;  Service: Orthopedics;  Laterality: Left;  . UPPER GI ENDOSCOPY  10/17/03   normal duodenum, normal esophagus, normal stomach   Family History  Problem Relation Age of Onset  . Ovarian cancer Mother   . Hypertension Sister   . Colon polyps Sister   . Cancer Sister        breast  . Heart disease Brother   . Colon polyps Brother   . Dementia Brother   . CVA Father    Social History   Socioeconomic History  . Marital status: Married    Spouse name: Not on file  . Number of children: 2  . Years of education: Not on file  . Highest education level: High school graduate  Occupational History  . Not on file  Social Needs  . Financial resource strain: Not hard at all  . Food insecurity    Worry: Never true    Inability: Never true  . Transportation needs    Medical: No    Non-medical: No  Tobacco Use  . Smoking status: Never Smoker  . Smokeless tobacco: Never Used  Substance and Sexual Activity  . Alcohol use: No  . Drug use: No  . Sexual activity: Not on  file  Lifestyle  . Physical activity    Days per week: 0 days    Minutes per session: 0 min  . Stress: Only a little  Relationships  . Social Musician on phone: Patient refused    Gets together: Patient refused    Attends religious service: Patient refused    Active member of club or organization: Patient refused    Attends meetings of clubs or organizations: Patient refused    Relationship status: Patient refused  Other Topics Concern  . Not on file  Social History Narrative  . Not on file    Outpatient Encounter Medications as of 01/22/2019  Medication Sig  . aspirin 81 MG tablet Take 81 mg by mouth daily.  .  cholecalciferol (VITAMIN D) 1000 UNITS tablet Take 1,000 Units by mouth daily.   . Cyanocobalamin (VITAMIN B 12 PO) Take 1,000 mcg by mouth daily.   Marland Kitchen docusate sodium (STOOL SOFTENER) 100 MG capsule Take 200 mg by mouth daily.   . hydrochlorothiazide (HYDRODIURIL) 25 MG tablet TAKE 1 TABLET DAILY  . Lactobacillus (ACIDOPHILUS) 100 MG CAPS Take 100 mg by mouth daily.  Marland Kitchen levothyroxine (SYNTHROID) 50 MCG tablet TAKE 1 TABLET DAILY  . MULTIPLE VITAMIN PO Take by mouth daily.   . Multiple Vitamins-Minerals (PRESERVISION AREDS 2 PO) Take 2 capsules by mouth daily.  . simvastatin (ZOCOR) 20 MG tablet TAKE 1 TABLET DAILY AT 6 P.M.  . Calcium Carbonate-Vitamin D (CALCIUM HIGH POTENCY/VITAMIN D) 600-200 MG-UNIT TABS Take by mouth.  Marland Kitchen CALCIUM-VITAMIN D PO Take 600 mg by mouth 2 (two) times daily.   . hyoscyamine (LEVSIN, ANASPAZ) 0.125 MG tablet Take 2 tablets (0.25 mg total) by mouth 2 (two) times daily. (Patient not taking: Reported on 02/07/2018)  . omeprazole (PRILOSEC) 20 MG capsule Take 20 mg by mouth daily.   Marland Kitchen oxybutynin (DITROPAN) 5 MG tablet TAKE 1 TABLET TWICE A DAY (Patient not taking: Reported on 02/07/2018)  . ranitidine (ZANTAC) 150 MG capsule Take 1 capsule (150 mg total) by mouth 2 (two) times daily. (Patient not taking: Reported on 04/11/2018)  . sucralfate (CARAFATE) 1 g tablet Take 1 tablet (1 g total) by mouth 4 (four) times daily -  with meals and at bedtime. (Patient not taking: Reported on 01/22/2019)   No facility-administered encounter medications on file as of 01/22/2019.     Activities of Daily Living In your present state of health, do you have any difficulty performing the following activities: 01/22/2019  Hearing? N  Vision? N  Comment Wears eye glasses daily.  Difficulty concentrating or making decisions? N  Walking or climbing stairs? N  Dressing or bathing? N  Doing errands, shopping? N  Preparing Food and eating ? N  Using the Toilet? N  In the past six months,  have you accidently leaked urine? Y  Comment Wears protection daily.  Do you have problems with loss of bowel control? N  Managing your Medications? N  Managing your Finances? N  Housekeeping or managing your Housekeeping? Y  Comment Does limited house work,. Has assistance cleaning.  Some recent data might be hidden    Patient Care Team: Maple Hudson., MD as PCP - General (Family Medicine)    Assessment:   This is a routine wellness examination for Heidi Yoder.  Exercise Activities and Dietary recommendations Current Exercise Habits: The patient does not participate in regular exercise at present, Exercise limited by: None identified  Goals    . Increase  water intake     Recommend increasing water intake to 4 glasses a day.        Fall Risk: Fall Risk  01/22/2019 01/16/2018 12/29/2016 12/16/2015 08/10/2015  Falls in the past year? 0 No No Yes Yes  Number falls in past yr: 0 - - 1 1  Injury with Fall? 0 - - Yes Yes    FALL RISK PREVENTION PERTAINING TO THE HOME:  Any stairs in or around the home? Yes  If so, are there any without handrails? No   Home free of loose throw rugs in walkways, pet beds, electrical cords, etc? Yes  Adequate lighting in your home to reduce risk of falls? Yes   ASSISTIVE DEVICES UTILIZED TO PREVENT FALLS:  Life alert? Yes  Use of a cane, walker or w/c? Yes  Grab bars in the bathroom? Yes  Shower chair or bench in shower? Yes  Elevated toilet seat or a handicapped toilet? No    TIMED UP AND GO:  Was the test performed? No .    Depression Screen PHQ 2/9 Scores 01/22/2019 01/16/2018 01/16/2018 12/29/2016  PHQ - 2 Score 0 0 0 0  PHQ- 9 Score - 1 - 0     Cognitive Function     6CIT Screen 01/22/2019 01/16/2018 12/29/2016  What Year? 0 points 0 points 0 points  What month? 0 points 0 points 0 points  What time? 0 points 0 points 0 points  Count back from 20 0 points 0 points 0 points  Months in reverse 0 points 0 points 0 points  Repeat  phrase 6 points 4 points 0 points  Total Score 6 4 0    Immunization History  Administered Date(s) Administered  . Influenza, High Dose Seasonal PF 02/09/2015, 12/29/2016, 01/16/2018  . Pneumococcal Conjugate-13 08/07/2013  . Pneumococcal Polysaccharide-23 05/03/2000  . Td 12/18/2003    Qualifies for Shingles Vaccine? Yes . Due for Shingrix. Education has been provided regarding the importance of this vaccine. Pt has been advised to call insurance company to determine out of pocket expense. Advised may also receive vaccine at local pharmacy or Health Dept. Verbalized acceptance and understanding.  Tdap: Although this vaccine is not a covered service during a Wellness Exam, does the patient still wish to receive this vaccine today?  No .    Flu Vaccine: Due for Flu vaccine. Does the patient want to receive this vaccine today?  No .   Pneumococcal Vaccine: Completed series  Screening Tests Health Maintenance  Topic Date Due  . INFLUENZA VACCINE  11/24/2018  . TETANUS/TDAP  01/22/2020 (Originally 12/17/2013)  . DEXA SCAN  08/24/2020  . PNA vac Low Risk Adult  Completed    Cancer Screenings:  Colorectal Screening: No longer required.   Mammogram: No longer required.   Bone Density: Completed 08/25/15. Results reflect OSTEOPENIA. Repeat every 5 years.   Lung Cancer Screening: (Low Dose CT Chest recommended if Age 81-80 years, 30 pack-year currently smoking OR have quit w/in 15years.) does not qualify.   Additional Screening:  Vision Screening: Recommended annual ophthalmology exams for early detection of glaucoma and other disorders of the eye.  Dental Screening: Recommended annual dental exams for proper oral hygiene  Community Resource Referral:  CRR required this visit?  No       Plan:  I have personally reviewed and addressed the Medicare Annual Wellness questionnaire and have noted the following in the patient's chart:  A. Medical and social history B. Use of  alcohol,  tobacco or illicit drugs  C. Current medications and supplements D. Functional ability and status E.  Nutritional status F.  Physical activity G. Advance directives H. List of other physicians I.  Hospitalizations, surgeries, and ER visits in previous 12 months J.  Vitals K. Screenings such as hearing and vision if needed, cognitive and depression L. Referrals and appointments   In addition, I have reviewed and discussed with patient certain preventive protocols, quality metrics, and best practice recommendations. A written personalized care plan for preventive services as well as general preventive health recommendations were provided to patient. Nurse Health Advisor  Signed,    Nomar Broad [place], [place]  5/62/1308 Nurse Health Advisor   Nurse Notes: Needs a flu shot at next in office visit.

## 2019-01-22 ENCOUNTER — Other Ambulatory Visit: Payer: Self-pay

## 2019-01-22 ENCOUNTER — Ambulatory Visit (INDEPENDENT_AMBULATORY_CARE_PROVIDER_SITE_OTHER): Payer: Medicare Other

## 2019-01-22 DIAGNOSIS — Z0000 Encounter for general adult medical examination without abnormal findings: Secondary | ICD-10-CM | POA: Diagnosis not present

## 2019-01-22 NOTE — Patient Instructions (Addendum)
Heidi Yoder , Thank you for taking time to come for your Medicare Wellness Visit. I appreciate your ongoing commitment to your health goals. Please review the following plan we discussed and let me know if I can assist you in the future.   Screening recommendations/referrals: Colonoscopy: No longer required.  Mammogram: No longer required.  Bone Density: Up to date, due 08/2020 Recommended yearly ophthalmology/optometry visit for glaucoma screening and checkup Recommended yearly dental visit for hygiene and checkup  Vaccinations: Influenza vaccine: Currently due Pneumococcal vaccine: Completed series Tdap vaccine: Pt declines today.  Shingles vaccine: Pt declines today.     Advanced directives: Please bring a copy of your POA (Power of Attorney) and/or Living Will to your next appointment.   Conditions/risks identified: Continue to increase water intake to 6-8 8 oz glasses a day as tolerated.   Next appointment: 02/06/19 @ 1:20 PM with Dr Rosanna Randy. Declined scheduling an AWV for 2021 at this time.    Preventive Care 25 Years and Older, Female Preventive care refers to lifestyle choices and visits with your health care provider that can promote health and wellness. What does preventive care include?  A yearly physical exam. This is also called an annual well check.  Dental exams once or twice a year.  Routine eye exams. Ask your health care provider how often you should have your eyes checked.  Personal lifestyle choices, including:  Daily care of your teeth and gums.  Regular physical activity.  Eating a healthy diet.  Avoiding tobacco and drug use.  Limiting alcohol use.  Practicing safe sex.  Taking low-dose aspirin every day.  Taking vitamin and mineral supplements as recommended by your health care provider. What happens during an annual well check? The services and screenings done by your health care provider during your annual well check will depend on your age,  overall health, lifestyle risk factors, and family history of disease. Counseling  Your health care provider may ask you questions about your:  Alcohol use.  Tobacco use.  Drug use.  Emotional well-being.  Home and relationship well-being.  Sexual activity.  Eating habits.  History of falls.  Memory and ability to understand (cognition).  Work and work Statistician.  Reproductive health. Screening  You may have the following tests or measurements:  Height, weight, and BMI.  Blood pressure.  Lipid and cholesterol levels. These may be checked every 5 years, or more frequently if you are over 19 years old.  Skin check.  Lung cancer screening. You may have this screening every year starting at age 70 if you have a 30-pack-year history of smoking and currently smoke or have quit within the past 15 years.  Fecal occult blood test (FOBT) of the stool. You may have this test every year starting at age 21.  Flexible sigmoidoscopy or colonoscopy. You may have a sigmoidoscopy every 5 years or a colonoscopy every 10 years starting at age 16.  Hepatitis C blood test.  Hepatitis B blood test.  Sexually transmitted disease (STD) testing.  Diabetes screening. This is done by checking your blood sugar (glucose) after you have not eaten for a while (fasting). You may have this done every 1-3 years.  Bone density scan. This is done to screen for osteoporosis. You may have this done starting at age 90.  Mammogram. This may be done every 1-2 years. Talk to your health care provider about how often you should have regular mammograms. Talk with your health care provider about your test results,  treatment options, and if necessary, the need for more tests. Vaccines  Your health care provider may recommend certain vaccines, such as:  Influenza vaccine. This is recommended every year.  Tetanus, diphtheria, and acellular pertussis (Tdap, Td) vaccine. You may need a Td booster every 10  years.  Zoster vaccine. You may need this after age 20.  Pneumococcal 13-valent conjugate (PCV13) vaccine. One dose is recommended after age 16.  Pneumococcal polysaccharide (PPSV23) vaccine. One dose is recommended after age 7. Talk to your health care provider about which screenings and vaccines you need and how often you need them. This information is not intended to replace advice given to you by your health care provider. Make sure you discuss any questions you have with your health care provider. Document Released: 05/08/2015 Document Revised: 12/30/2015 Document Reviewed: 02/10/2015 Elsevier Interactive Patient Education  2017 [place] Prevention in the Home Falls can cause injuries. They can happen to people of all ages. There are many things you can do to make your home safe and to help prevent falls. What can I do on the outside of my home?  Regularly fix the edges of walkways and driveways and fix any cracks.  Remove anything that might make you trip as you walk through a door, such as a raised step or threshold.  Trim any bushes or trees on the path to your home.  Use bright outdoor lighting.  Clear any walking paths of anything that might make someone trip, such as rocks or tools.  Regularly check to see if handrails are loose or broken. Make sure that both sides of any steps have handrails.  Any raised decks and porches should have guardrails on the edges.  Have any leaves, snow, or ice cleared regularly.  Use sand or salt on walking paths during winter.  Clean up any spills in your garage right away. This includes oil or grease spills. What can I do in the bathroom?  Use night lights.  Install grab bars by the toilet and in the tub and shower. Do not use towel bars as grab bars.  Use non-skid mats or decals in the tub or shower.  If you need to sit down in the shower, use a plastic, non-slip stool.  Keep the floor dry. Clean up any water that  spills on the floor as soon as it happens.  Remove soap buildup in the tub or shower regularly.  Attach bath mats securely with double-sided non-slip rug tape.  Do not have throw rugs and other things on the floor that can make you trip. What can I do in the bedroom?  Use night lights.  Make sure that you have a light by your bed that is easy to reach.  Do not use any sheets or blankets that are too big for your bed. They should not hang down onto the floor.  Have a firm chair that has side arms. You can use this for support while you get dressed.  Do not have throw rugs and other things on the floor that can make you trip. What can I do in the kitchen?  Clean up any spills right away.  Avoid walking on wet floors.  Keep items that you use a lot in easy-to-reach places.  If you need to reach something above you, use a strong step stool that has a grab bar.  Keep electrical cords out of the way.  Do not use floor polish or wax that makes floors  slippery. If you must use wax, use non-skid floor wax.  Do not have throw rugs and other things on the floor that can make you trip. What can I do with my stairs?  Do not leave any items on the stairs.  Make sure that there are handrails on both sides of the stairs and use them. Fix handrails that are broken or loose. Make sure that handrails are as long as the stairways.  Check any carpeting to make sure that it is firmly attached to the stairs. Fix any carpet that is loose or worn.  Avoid having throw rugs at the top or bottom of the stairs. If you do have throw rugs, attach them to the floor with carpet tape.  Make sure that you have a light switch at the top of the stairs and the bottom of the stairs. If you do not have them, ask someone to add them for you. What else can I do to help prevent falls?  Wear shoes that:  Do not have high heels.  Have rubber bottoms.  Are comfortable and fit you well.  Are closed at the  toe. Do not wear sandals.  If you use a stepladder:  Make sure that it is fully opened. Do not climb a closed stepladder.  Make sure that both sides of the stepladder are locked into place.  Ask someone to hold it for you, if possible.  Clearly mark and make sure that you can see:  Any grab bars or handrails.  First and last steps.  Where the edge of each step is.  Use tools that help you move around (mobility aids) if they are needed. These include:  Canes.  Walkers.  Scooters.  Crutches.  Turn on the lights when you go into a dark area. Replace any light bulbs as soon as they burn out.  Set up your furniture so you have a clear path. Avoid moving your furniture around.  If any of your floors are uneven, fix them.  If there are any pets around you, be aware of where they are.  Review your medicines with your doctor. Some medicines can make you feel dizzy. This can increase your chance of falling. Ask your doctor what other things that you can do to help prevent falls. This information is not intended to replace advice given to you by your health care provider. Make sure you discuss any questions you have with your health care provider. Document Released: 02/05/2009 Document Revised: 09/17/2015 Document Reviewed: 05/16/2014 Elsevier Interactive Patient Education  2017 [employer].

## 2019-02-06 ENCOUNTER — Ambulatory Visit: Payer: Medicare Other | Admitting: Family Medicine

## 2019-02-06 ENCOUNTER — Encounter: Payer: Self-pay | Admitting: Family Medicine

## 2019-02-06 ENCOUNTER — Other Ambulatory Visit: Payer: Self-pay

## 2019-02-06 VITALS — BP 126/74 | HR 64 | Temp 98.2°F | Resp 16 | Wt 147.0 lb

## 2019-02-06 DIAGNOSIS — Z23 Encounter for immunization: Secondary | ICD-10-CM

## 2019-02-06 DIAGNOSIS — I10 Essential (primary) hypertension: Secondary | ICD-10-CM

## 2019-02-06 DIAGNOSIS — E7800 Pure hypercholesterolemia, unspecified: Secondary | ICD-10-CM

## 2019-02-06 DIAGNOSIS — E039 Hypothyroidism, unspecified: Secondary | ICD-10-CM | POA: Diagnosis not present

## 2019-02-06 DIAGNOSIS — K219 Gastro-esophageal reflux disease without esophagitis: Secondary | ICD-10-CM

## 2019-02-06 DIAGNOSIS — N393 Stress incontinence (female) (male): Secondary | ICD-10-CM

## 2019-02-06 NOTE — Progress Notes (Signed)
Patient: Heidi Yoder Female    DOB: April 02, 1930   85 y.o.   MRN: [id number] Visit Date: 02/06/2019  Today's Provider: Megan Mans, MD   Chief Complaint  Patient presents with  . Follow-up   Subjective:   HPI Patient comes in today for a follow up. She was last seen in the office 10 months ago.    Lipid/Cholesterol, Follow-up:   Management changes since that visit include no changes. . Last Lipid Panel:    Component Value Date/Time   CHOL 195 02/07/2018 1121   TRIG 196 (H) 02/07/2018 1121   HDL 65 02/07/2018 1121   CHOLHDL 3.0 02/07/2018 1121   CHOLHDL 3.0 01/02/2017 0936   LDLCALC 91 02/07/2018 1121   LDLCALC 102 (H) 01/02/2017 0936    Risk factors for vascular disease include hypertension  She reports good compliance with treatment. She is not having side effects.  Current symptoms include none and have been stable. Weight trend: stable Prior visit with dietician: no Current diet: well balanced Current exercise: no regular exercise   She is been doing fairly well despite the Covid virus restrictions.  She has no new complaints.  Her overactive bladder and reflux are stable.  Her arthritic changes are stable and  she is still able to ambulate on her own.  Wt Readings from Last 3 Encounters:  02/06/19 147 lb (66.7 kg)  02/07/18 141 lb (64 kg)  01/16/18 141 lb 6.4 oz (64.1 kg)     No Known Allergies   Current Outpatient Medications:  .  aspirin 81 MG tablet, Take 81 mg by mouth daily., Disp: , Rfl:  .  Calcium Carbonate-Vitamin D (CALCIUM HIGH POTENCY/VITAMIN D) 600-200 MG-UNIT TABS, Take by mouth., Disp: , Rfl:  .  CALCIUM-VITAMIN D PO, Take 600 mg by mouth 2 (two) times daily. , Disp: , Rfl:  .  cholecalciferol (VITAMIN D) 1000 UNITS tablet, Take 1,000 Units by mouth daily. , Disp: , Rfl:  .  Cyanocobalamin (VITAMIN B 12 PO), Take 1,000 mcg by mouth daily. , Disp: , Rfl:  .  docusate sodium (STOOL SOFTENER) 100 MG capsule, Take 200 mg  by mouth daily. , Disp: , Rfl:  .  hydrochlorothiazide (HYDRODIURIL) 25 MG tablet, TAKE 1 TABLET DAILY, Disp: 90 tablet, Rfl: 3 .  Lactobacillus (ACIDOPHILUS) 100 MG CAPS, Take 100 mg by mouth daily., Disp: , Rfl:  .  levothyroxine (SYNTHROID) 50 MCG tablet, TAKE 1 TABLET DAILY, Disp: 90 tablet, Rfl: 3 .  MULTIPLE VITAMIN PO, Take by mouth daily. , Disp: , Rfl:  .  Multiple Vitamins-Minerals (PRESERVISION AREDS 2 PO), Take 2 capsules by mouth daily., Disp: , Rfl:  .  omeprazole (PRILOSEC) 20 MG capsule, Take 20 mg by mouth daily. , Disp: , Rfl:  .  simvastatin (ZOCOR) 20 MG tablet, TAKE 1 TABLET DAILY AT 6 P.M., Disp: 90 tablet, Rfl: 4 .  hyoscyamine (LEVSIN, ANASPAZ) 0.125 MG tablet, Take 2 tablets (0.25 mg total) by mouth 2 (two) times daily. (Patient not taking: Reported on 02/07/2018), Disp: 60 tablet, Rfl: 5 .  oxybutynin (DITROPAN) 5 MG tablet, TAKE 1 TABLET TWICE A DAY (Patient not taking: Reported on 02/07/2018), Disp: 180 tablet, Rfl: 3 .  ranitidine (ZANTAC) 150 MG capsule, Take 1 capsule (150 mg total) by mouth 2 (two) times daily. (Patient not taking: Reported on 04/11/2018), Disp: 60 capsule, Rfl: 11 .  sucralfate (CARAFATE) 1 g tablet, Take 1 tablet (1 g total) by mouth  4 (four) times daily -  with meals and at bedtime. (Patient not taking: Reported on 01/22/2019), Disp: 12 tablet, Rfl: 0  Review of Systems  Constitutional: Positive for fatigue. Negative for activity change.       Chronic fatigue.  HENT: Negative.   Eyes: Negative.   Respiratory: Negative.  Negative for cough and shortness of breath.   Cardiovascular: Negative.  Negative for chest pain, palpitations and leg swelling.  Gastrointestinal: Negative.   Endocrine: Negative.   Genitourinary: Negative.   Musculoskeletal: Negative.   Skin: Negative.   Allergic/Immunologic: Negative.   Neurological: Negative.  Negative for dizziness and headaches.  Hematological: Negative.   Psychiatric/Behavioral: Negative.  Negative  for agitation, self-injury, sleep disturbance and suicidal ideas.    Social History   Tobacco Use  . Smoking status: Never Smoker  . Smokeless tobacco: Never Used  Substance Use Topics  . Alcohol use: No      Objective:   BP 126/74   Pulse 64   Temp 98.2 F (36.8 C)   Resp 16   Wt 147 lb (66.7 kg)   SpO2 96%   BMI 28.71 kg/m  Vitals:   02/06/19 1345  BP: 126/74  Pulse: 64  Resp: 16  Temp: 98.2 F (36.8 C)  SpO2: 96%  Weight: 147 lb (66.7 kg)  Body mass index is 28.71 kg/m.   Physical Exam Vitals signs reviewed.  Constitutional:      Appearance: She is well-developed.  HENT:     Head: Normocephalic and atraumatic.     Right Ear: External ear normal.     Left Ear: External ear normal.     Nose: Nose normal.  Eyes:     General: No scleral icterus.    Conjunctiva/sclera: Conjunctivae normal.     Comments: Right pupil dilated,irregular.  Cardiovascular:     Rate and Rhythm: Normal rate and regular rhythm.     Heart sounds: Normal heart sounds.  Pulmonary:     Effort: Pulmonary effort is normal.     Breath sounds: Normal breath sounds.  Abdominal:     Palpations: Abdomen is soft.  Musculoskeletal:     Comments: Significant thoracic kyphosis.  Skin:    General: Skin is warm and dry.  Neurological:     Mental Status: She is alert and oriented to person, place, and time.  Psychiatric:        Behavior: Behavior normal.        Thought Content: Thought content normal.        Judgment: Judgment normal.      No results found for any visits on 02/06/19.     Assessment & Plan    1. Essential (primary) hypertension Controlled on HCTZ - Comprehensive metabolic panel  2. Hypercholesteremia On simvastatin.  Continue this for now, consider stopping in the future in this 85 year old - Lipid panel  3. Hypothyroidism, unspecified type  - TSH  4. Female stress incontinence This appears to be overactive bladder.More than 50% 25 minute visit spent in  counseling or coordination of care   5. Gastroesophageal reflux disease, unspecified whether esophagitis present On daily omeprazole. - CBC with Differential/Platelet  6. Need for immunization against influenza  - Flu Vaccine QUAD High Dose(Fluad)     Wilhemena Durie, MD  [facility]

## 2019-02-07 LAB — CBC WITH DIFFERENTIAL/PLATELET
Basophils Absolute: 0.1 10*3/uL (ref 0.0–0.2)
Basos: 1 %
EOS (ABSOLUTE): 0.2 10*3/uL (ref 0.0–0.4)
Eos: 2 %
Hematocrit: 35 % (ref 34.0–46.6)
Hemoglobin: 11.8 g/dL (ref 11.1–15.9)
Immature Grans (Abs): 0 10*3/uL (ref 0.0–0.1)
Immature Granulocytes: 0 %
Lymphocytes Absolute: 3.4 10*3/uL — ABNORMAL HIGH (ref 0.7–3.1)
Lymphs: 34 %
MCH: 31.1 pg (ref 26.6–33.0)
MCHC: 33.7 g/dL (ref 31.5–35.7)
MCV: 92 fL (ref 79–97)
Monocytes Absolute: 0.6 10*3/uL (ref 0.1–0.9)
Monocytes: 6 %
Neutrophils Absolute: 5.8 10*3/uL (ref 1.4–7.0)
Neutrophils: 57 %
Platelets: 294 10*3/uL (ref 150–450)
RBC: 3.8 x10E6/uL (ref 3.77–5.28)
RDW: 12.3 % (ref 11.7–15.4)
WBC: 10.1 10*3/uL (ref 3.4–10.8)

## 2019-02-07 LAB — COMPREHENSIVE METABOLIC PANEL
ALT: 16 IU/L (ref 0–32)
AST: 18 IU/L (ref 0–40)
Albumin/Globulin Ratio: 1.4 (ref 1.2–2.2)
Albumin: 4 g/dL (ref 3.6–4.6)
Alkaline Phosphatase: 89 IU/L (ref 39–117)
BUN/Creatinine Ratio: 19 (ref 12–28)
BUN: 21 mg/dL (ref 8–27)
Bilirubin Total: 0.3 mg/dL (ref 0.0–1.2)
CO2: 25 mmol/L (ref 20–29)
Calcium: 10 mg/dL (ref 8.7–10.3)
Chloride: 101 mmol/L (ref 96–106)
Creatinine, Ser: 1.1 mg/dL — ABNORMAL HIGH (ref 0.57–1.00)
GFR calc Af Amer: 53 mL/min/{1.73_m2} — ABNORMAL LOW (ref >59)
GFR calc non Af Amer: 46 mL/min/{1.73_m2} — ABNORMAL LOW (ref >59)
Globulin, Total: 2.9 g/dL (ref 1.5–4.5)
Glucose: 85 mg/dL (ref 65–99)
Potassium: 4 mmol/L (ref 3.5–5.2)
Sodium: 141 mmol/L (ref 134–144)
Total Protein: 6.9 g/dL (ref 6.0–8.5)

## 2019-02-07 LAB — LIPID PANEL
Chol/HDL Ratio: 3.6 ratio (ref 0.0–4.4)
Cholesterol, Total: 207 mg/dL — ABNORMAL HIGH (ref 100–199)
HDL: 58 mg/dL (ref >39)
LDL Chol Calc (NIH): 112 mg/dL — ABNORMAL HIGH (ref 0–99)
Triglycerides: 217 mg/dL — ABNORMAL HIGH (ref 0–149)
VLDL Cholesterol Cal: 37 mg/dL (ref 5–40)

## 2019-02-07 LAB — TSH: TSH: 2.81 u[IU]/mL (ref 0.450–4.500)

## 2019-02-14 ENCOUNTER — Other Ambulatory Visit: Payer: Self-pay | Admitting: Family Medicine

## 2019-02-14 MED ORDER — SIMVASTATIN 20 MG PO TABS: 20.0000 mg | ORAL_TABLET | Freq: Every day | ORAL | 3 refills | Status: DC

## 2019-02-14 NOTE — Telephone Encounter (Signed)
Pt needing a refill on:  simvastatin (ZOCOR) 20 MG tablet  Please fill at:  [place], [place] - [address] [phone] (Phone) [phone] (Fax)   [place], [place]

## 2019-02-14 NOTE — Telephone Encounter (Signed)
Please review. Thanks!  

## 2019-03-12 LAB — HM MAMMOGRAPHY

## 2019-03-18 ENCOUNTER — Encounter: Payer: Self-pay | Admitting: *Deleted

## 2019-08-01 ENCOUNTER — Other Ambulatory Visit: Payer: Self-pay | Admitting: Family Medicine

## 2019-08-01 MED ORDER — LEVOTHYROXINE SODIUM 50 MCG PO TABS: 50.0000 ug | ORAL_TABLET | Freq: Every day | ORAL | 0 refills | Status: DC

## 2019-08-01 NOTE — Telephone Encounter (Signed)
Patient was advised by her mail order pharmacy to contact PCP and request 8 tablets of levothyroxine (SYNTHROID) 50 MCG tablet please send to   [id number] [place], [place] - [address] Phone:  [phone]  Fax:  [phone]     *patient states she's out and would like Rx sent in today and would like a follow up call

## 2019-08-01 NOTE — Telephone Encounter (Signed)
Requested Prescriptions  Pending Prescriptions Disp Refills  . hydrochlorothiazide (HYDRODIURIL) 25 MG tablet [Pharmacy Med Name: HYDROCHLOROTHIAZIDE  25MG   TAB] 90 tablet 3    Sig: TAKE 1 TABLET BY MOUTH  DAILY     Cardiovascular: Diuretics - Thiazide Failed - 08/01/2019  1:39 PM      Failed - Cr in normal range and within 360 days    Creat  Date Value Ref Range Status  01/02/2017 1.01 (H) 0.60 - 0.88 mg/dL Final    Comment:    For patients >84 years of age, the reference limit for Creatinine is approximately 13% higher for people identified as African-American. .    Creatinine, Ser  Date Value Ref Range Status  02/06/2019 1.10 (H) 0.57 - 1.00 mg/dL Final         Passed - Ca in normal range and within 360 days    Calcium  Date Value Ref Range Status  02/06/2019 10.0 8.7 - 10.3 mg/dL Final   Calcium, Ion  Date Value Ref Range Status  04/11/2018 5.1 4.5 - 5.6 mg/dL Final         Passed - K in normal range and within 360 days    Potassium  Date Value Ref Range Status  02/06/2019 4.0 3.5 - 5.2 mmol/L Final         Passed - Na in normal range and within 360 days    Sodium  Date Value Ref Range Status  02/06/2019 141 134 - 144 mmol/L Final         Passed - Last BP in normal range    BP Readings from Last 1 Encounters:  02/06/19 126/74         Passed - Valid encounter within last 6 months    Recent Outpatient Visits          5 months ago Essential (primary) hypertension   [facility]   1 year ago Serum calcium elevated   [facility] MD   1 year ago Medicare annual wellness visit, subsequent   [facility] MD   1 year ago Essential (primary) hypertension   [facility]   2 years ago Essential (primary) hypertension   [facility] MD      Future Appointments      In 6 days Maple Hudson., MD [facility]

## 2019-08-05 ENCOUNTER — Telehealth: Payer: Self-pay | Admitting: Family Medicine

## 2019-08-05 ENCOUNTER — Telehealth: Payer: Self-pay

## 2019-08-05 NOTE — Telephone Encounter (Signed)
[facility] faxed refill request for the following medications:  levothyroxine (SYNTHROID) 50 MCG tablet    Please advise.  

## 2019-08-05 NOTE — Telephone Encounter (Signed)
Spoke with patients son over the phone his mother has an upcoming appointment, he wanted to know if it was possible to have his mother be evaluated for dementia/ Alzheimers disease because she is becoming very forgetful, confused and sleeps a lot during the day but he doesn't want her to know that he called. Please advise?

## 2019-08-05 NOTE — Telephone Encounter (Signed)
Copied from CRM [id number]. Topic: General - Call Back - No Documentation >> Aug 05, 2019 10:10 AM Randol Kern wrote: Reason for CRM: Pt's son called Severina Sykora requesting a call back from Clinic.  [phone]

## 2019-08-05 NOTE — Telephone Encounter (Signed)
Son called back and ask that we call him back around 430 pm. [phone]

## 2019-08-06 ENCOUNTER — Telehealth: Payer: Self-pay

## 2019-08-06 NOTE — Telephone Encounter (Signed)
Patient has office visit tomorrow, will refill then.

## 2019-08-06 NOTE — Progress Notes (Signed)
Established patient visit     I,Atiya M Cummings,acting as a scribe for Megan Mans, MD.,have documented all relevant documentation on the behalf of Megan Mans, MD,as directed by  Megan Mans, MD while in the presence of Megan Mans, MD.   Patient: Heidi Yoder   DOB: [birth year]   86 y.o. Female  MRN: [id number] Visit Date: 08/07/2019  Today's healthcare provider: Megan Mans, MD  Subjective:    Chief Complaint  Patient presents with  . Hyperlipidemia   HPI  Patient concerned because she has 3 corns on the bottom of her foot and is having trouble holding her bladder.  She is describing urgency with urination but mainly just when her bladder is full.  She does not have to change a pad very often. She states mainly her children drive for her now.  She does not cook very often now.  Her husband died about 3 years ago and she still lives independently. Lipid/Cholesterol, Follow-up  Last Lipid Panel:    Component Value Date/Time   CHOL 207 (H) 02/06/2019 1417   TRIG 217 (H) 02/06/2019 1417   HDL 58 02/06/2019 1417   CHOLHDL 3.6 02/06/2019 1417   CHOLHDL 3.0 01/02/2017 0936   LDLCALC 112 (H) 02/06/2019 1417   LDLCALC 102 (H) 01/02/2017 0936    She was last seen for this 6 months ago.  Management since that visit includes Simvastatin.  She reports excellent compliance with treatment. She is not having side effects.  Symptoms: No chest pain No chest pressure/discomfort No dyspnea No lower extremity edema No numbness or tingling of extremity No orthopnea No palpitations No paroxysmal nocturnal dyspnea No speech difficulty No syncope  Current diet: well balanced Current exercise: none  Wt Readings from Last 3 Encounters:  08/07/19 147 lb (66.7 kg)  02/06/19 147 lb (66.7 kg)  02/07/18 141 lb (64 kg)   The ASCVD Risk score Denman George DC Jr., et al., 2013) failed to calculate for the following reasons:   The 2013 ASCVD risk score  is only valid for ages 15 to 71  ------------------------------------------------------------------------------------------------------------- Last metabolic panel Lab Results  Component Value Date   GLUCOSE 85 02/06/2019   NA 141 02/06/2019   K 4.0 02/06/2019   CL 101 02/06/2019   CO2 25 02/06/2019   BUN 21 02/06/2019   CREATININE 1.10 (H) 02/06/2019   GFRNONAA 46 (L) 02/06/2019   GFRAA 53 (L) 02/06/2019   CALCIUM 10.0 02/06/2019   PROT 6.9 02/06/2019   ALBUMIN 4.0 02/06/2019   LABGLOB 2.9 02/06/2019   AGRATIO 1.4 02/06/2019   BILITOT 0.3 02/06/2019   ALKPHOS 89 02/06/2019   AST 18 02/06/2019   ALT 16 02/06/2019   ANIONGAP 4 (L) 04/06/2015   Last lipids Lab Results  Component Value Date   CHOL 207 (H) 02/06/2019   HDL 58 02/06/2019   LDLCALC 112 (H) 02/06/2019   TRIG 217 (H) 02/06/2019   CHOLHDL 3.6 02/06/2019    The ASCVD Risk score (Goff DC Jr., et al., 2013) failed to calculate for the following reasons:   The 2013 ASCVD risk score is only valid for ages 50 to 65   ---------------------------------------------------------------------------------------------------------------------- 1. Essential (primary) hypertension - 02/06/2019 Controlled on HCTZ - Comprehensive metabolic panel  2. Hypercholesteremia On simvastatin.  Continue this for now, consider stopping in the future in this 86 year old - Lipid panel  3. Hypothyroidism, unspecified type  - TSH  4. Female stress incontinence This appears to be overactive  bladder.More than 50% 25 minute visit spent in counseling or coordination of care   5. Gastroesophageal reflux disease, unspecified whether esophagitis present On daily omeprazole. - CBC with Differential/Platelet     Medications: Outpatient Medications Prior to Visit  Medication Sig  . aspirin 81 MG tablet Take 81 mg by mouth daily.  . cholecalciferol (VITAMIN D) 1000 UNITS tablet Take 1,000 Units by mouth daily.   . Cyanocobalamin  (VITAMIN B 12 PO) Take 1,000 mcg by mouth daily.   Marland Kitchen docusate sodium (STOOL SOFTENER) 100 MG capsule Take 200 mg by mouth daily.   . hydrochlorothiazide (HYDRODIURIL) 25 MG tablet TAKE 1 TABLET BY MOUTH  DAILY  . Lactobacillus (ACIDOPHILUS) 100 MG CAPS Take 100 mg by mouth daily.  Marland Kitchen levothyroxine (SYNTHROID) 50 MCG tablet Take 1 tablet (50 mcg total) by mouth daily.  . MULTIPLE VITAMIN PO Take by mouth daily.   . Multiple Vitamins-Minerals (PRESERVISION AREDS 2 PO) Take 2 capsules by mouth daily.  Marland Kitchen omeprazole (PRILOSEC) 20 MG capsule Take 20 mg by mouth daily.   . simvastatin (ZOCOR) 20 MG tablet Take 1 tablet (20 mg total) by mouth daily.  . Calcium Carbonate-Vitamin D (CALCIUM HIGH POTENCY/VITAMIN D) 600-200 MG-UNIT TABS Take by mouth.  Marland Kitchen CALCIUM-VITAMIN D PO Take 600 mg by mouth 2 (two) times daily.   . hyoscyamine (LEVSIN, ANASPAZ) 0.125 MG tablet Take 2 tablets (0.25 mg total) by mouth 2 (two) times daily. (Patient not taking: Reported on 02/07/2018)  . oxybutynin (DITROPAN) 5 MG tablet TAKE 1 TABLET TWICE A DAY (Patient not taking: Reported on 02/07/2018)  . ranitidine (ZANTAC) 150 MG capsule Take 1 capsule (150 mg total) by mouth 2 (two) times daily. (Patient not taking: Reported on 04/11/2018)  . sucralfate (CARAFATE) 1 g tablet Take 1 tablet (1 g total) by mouth 4 (four) times daily -  with meals and at bedtime. (Patient not taking: Reported on 01/22/2019)   No facility-administered medications prior to visit.    Review of Systems  Constitutional: Negative.   HENT: Negative.   Respiratory: Negative.   Cardiovascular: Negative.   Gastrointestinal: Negative.   Genitourinary: Positive for enuresis.  Skin:       She does complain of corns on her right feet and thickening toenails.  He has trouble cutting the toenails now there is a thick  Allergic/Immunologic: Negative.   Neurological: Negative.        Mild memory loss.  Hematological: Negative.   Psychiatric/Behavioral:  Negative.          Objective:    BP 138/78 (BP Location: Right Arm, Patient Position: Sitting, Cuff Size: Normal)   Pulse 67   Temp (!) 97.3 F (36.3 C) (Temporal)   Wt 147 lb (66.7 kg)   SpO2 99%   BMI 28.71 kg/m  BP Readings from Last 3 Encounters:  08/07/19 138/78  02/06/19 126/74  04/11/18 (!) 147/78   Wt Readings from Last 3 Encounters:  08/07/19 147 lb (66.7 kg)  02/06/19 147 lb (66.7 kg)  02/07/18 141 lb (64 kg)      Physical Exam Vitals reviewed.  Constitutional:      Appearance: Normal appearance.  HENT:     Head: Normocephalic and atraumatic.     Right Ear: External ear normal.     Left Ear: External ear normal.  Eyes:     General: No scleral icterus.    Conjunctiva/sclera: Conjunctivae normal.  Neck:     Vascular: No carotid bruit.  Cardiovascular:  Rate and Rhythm: Normal rate and regular rhythm.     Pulses: Normal pulses.     Heart sounds: Normal heart sounds.  Pulmonary:     Effort: Pulmonary effort is normal.     Breath sounds: Normal breath sounds.  Musculoskeletal:     Right lower leg: No edema.     Left lower leg: No edema.  Lymphadenopathy:     Cervical: No cervical adenopathy.  Skin:    General: Skin is warm and dry.     Comments: Onychomycosis and corns  Neurological:     Mental Status: She is alert and oriented to person, place, and time.  Psychiatric:        Mood and Affect: Mood normal.        Behavior: Behavior normal.        Thought Content: Thought content normal.        Judgment: Judgment normal.     MMSE is 29/30.  No results found for any visits on 08/07/19.    Assessment & Plan:    1. Hypercholesteremia No changes.   2. Female stress incontinence - POCT urinalysis dipstick - Urine Culture  3. Mild cognitive impairment Follow up at next visit .  We also discussed that it would probably be good for patient to get the Covid vaccine Discussed donepezil in this patient but she just wishes to wait for now for  it. 4. Onychomycosis Referral to podiatry.  5.  Corns of her feet She is having to wear doughnut pads on her feet for this.  It is uncomfortable and tender.  No secondary infection.  4-5 month f/u  I, Megan Mans, MD, have reviewed all documentation for this visit. The documentation on 08/07/19 for the exam, diagnosis, procedures, and orders are all accurate and complete. Winnell Bento Wendelyn Breslow, MD  [facility] [phone] (phone) [phone] (fax)  [facility]

## 2019-08-06 NOTE — Telephone Encounter (Signed)
Appointment does not need to be re-scheduled but patient needs to know if it is in person or virtual   Copied from CRM [id number]. Topic: Appointment Scheduling - Scheduling Inquiry for Clinic >> Aug 06, 2019 10:14 AM Leafy Ro wrote: Reason for CRM: pt has an appt tomorrow with dr Sullivan Lone at 120 pm however there is a pencil symbol. Pt confirm with md

## 2019-08-06 NOTE — Telephone Encounter (Signed)
Patient advised that her appointment for tomorrow is in person.

## 2019-08-07 ENCOUNTER — Encounter: Payer: Self-pay | Admitting: Family Medicine

## 2019-08-07 ENCOUNTER — Other Ambulatory Visit: Payer: Self-pay

## 2019-08-07 ENCOUNTER — Ambulatory Visit: Payer: Medicare Other | Admitting: Family Medicine

## 2019-08-07 VITALS — BP 138/78 | HR 67 | Temp 97.3°F | Wt 147.0 lb

## 2019-08-07 DIAGNOSIS — G3184 Mild cognitive impairment, so stated: Secondary | ICD-10-CM | POA: Diagnosis not present

## 2019-08-07 DIAGNOSIS — B351 Tinea unguium: Secondary | ICD-10-CM

## 2019-08-07 DIAGNOSIS — L84 Corns and callosities: Secondary | ICD-10-CM

## 2019-08-07 DIAGNOSIS — E7800 Pure hypercholesterolemia, unspecified: Secondary | ICD-10-CM

## 2019-08-07 DIAGNOSIS — N393 Stress incontinence (female) (male): Secondary | ICD-10-CM | POA: Diagnosis not present

## 2019-08-07 LAB — POCT URINALYSIS DIPSTICK
Bilirubin, UA: NEGATIVE
Blood, UA: NEGATIVE
Glucose, UA: NEGATIVE
Ketones, UA: NEGATIVE
Nitrite, UA: NEGATIVE
Protein, UA: NEGATIVE
Spec Grav, UA: 1.025 (ref 1.010–1.025)
Urobilinogen, UA: 0.2 E.U./dL
pH, UA: 6 (ref 5.0–8.0)

## 2019-08-07 NOTE — Patient Instructions (Addendum)
COVID VACCINE:  [phone]

## 2019-08-09 LAB — URINE CULTURE: Organism ID, Bacteria: NO GROWTH

## 2019-08-13 ENCOUNTER — Other Ambulatory Visit: Payer: Self-pay | Admitting: Family Medicine

## 2019-08-13 MED ORDER — LEVOTHYROXINE SODIUM 50 MCG PO TABS: 50.0000 ug | ORAL_TABLET | Freq: Every day | ORAL | 1 refills | Status: DC

## 2019-08-13 MED ORDER — LEVOTHYROXINE SODIUM 50 MCG PO TABS: 50.0000 ug | ORAL_TABLET | Freq: Every day | ORAL | 0 refills | Status: DC

## 2019-08-13 NOTE — Telephone Encounter (Signed)
Pt requested short term fill at local pharmacy and for full refill to be sent to [facility]. Requested Prescriptions  Pending Prescriptions Disp Refills  . levothyroxine (SYNTHROID) 50 MCG tablet 90 tablet 2    Sig: Take 1 tablet (50 mcg total) by mouth daily.     There is no refill protocol information for this order    Signed Prescriptions Disp Refills   levothyroxine (SYNTHROID) 50 MCG tablet 10 tablet 0    Sig: Take 1 tablet (50 mcg total) by mouth daily.     Endocrinology:  Hypothyroid Agents Failed - 08/13/2019  3:48 PM      Failed - TSH needs to be rechecked within 3 months after an abnormal result. Refill until TSH is due.      Passed - TSH in normal range and within 360 days    TSH  Date Value Ref Range Status  02/06/2019 2.810 0.450 - 4.500 uIU/mL Final         Passed - Valid encounter within last 12 months    Recent Outpatient Visits          6 days ago Hypercholesteremia   [facility] Maple Hudson., MD   6 months ago Essential (primary) hypertension   [facility] Maple Hudson., MD   1 year ago Serum calcium elevated   [facility] Maple Hudson., MD   1 year ago Medicare annual wellness visit, subsequent   [facility] Maple Hudson., MD   1 year ago Essential (primary) hypertension   [facility] Maple Hudson., MD      Future Appointments            In 3 months Maple Hudson., MD [facility]

## 2019-08-13 NOTE — Telephone Encounter (Signed)
Pt requesting short term until mail order arrives. This encounter is to order med for mail order.

## 2019-08-13 NOTE — Telephone Encounter (Signed)
Requested Prescriptions  Pending Prescriptions Disp Refills  . levothyroxine (SYNTHROID) 50 MCG tablet 10 tablet 0    Sig: Take 1 tablet (50 mcg total) by mouth daily.     Endocrinology:  Hypothyroid Agents Failed - 08/13/2019  3:48 PM      Failed - TSH needs to be rechecked within 3 months after an abnormal result. Refill until TSH is due.      Passed - TSH in normal range and within 360 days    TSH  Date Value Ref Range Status  02/06/2019 2.810 0.450 - 4.500 uIU/mL Final         Passed - Valid encounter within last 12 months    Recent Outpatient Visits          6 days ago Hypercholesteremia   [facility] Maple Hudson., MD   6 months ago Essential (primary) hypertension   [facility] Maple Hudson., MD   1 year ago Serum calcium elevated   [facility] Maple Hudson., MD   1 year ago Medicare annual wellness visit, subsequent   [facility] Maple Hudson., MD   1 year ago Essential (primary) hypertension   [facility] Maple Hudson., MD      Future Appointments            In 3 months Maple Hudson., MD [facility]           'This is a short term refill until mail order refill arrives.

## 2019-08-13 NOTE — Telephone Encounter (Signed)
Copied from CRM [id number]. Topic: Quick Communication - Rx Refill/Question >> Aug 13, 2019  3:44 PM Mcneil, Ja-Kwan wrote: Medication: levothyroxine (SYNTHROID) 50 MCG tablet  Has the patient contacted their pharmacy? yes   Preferred Pharmacy (with phone number or street name): [id number] [place] [address]  Phone: [phone] Fax: [phone]  Agent: Please be advised that RX refills may take up to 3 business days. We ask that you follow-up with your pharmacy.

## 2019-08-13 NOTE — Addendum Note (Signed)
Addended by: Garrison Columbus on: 08/13/2019 04:08 PM   Modules accepted: Orders

## 2019-08-23 ENCOUNTER — Telehealth: Payer: Self-pay

## 2019-08-23 DIAGNOSIS — E039 Hypothyroidism, unspecified: Secondary | ICD-10-CM

## 2019-08-23 MED ORDER — LEVOTHYROXINE SODIUM 50 MCG PO TABS: 50.0000 ug | ORAL_TABLET | Freq: Every day | ORAL | 3 refills | Status: DC

## 2019-08-23 NOTE — Telephone Encounter (Signed)
Copied from CRM [id number]. Topic: General - Inquiry >> Aug 23, 2019 11:16 AM Deborha Payment wrote: Reason for CRM:  Patient is wondering if she still needs to be on levothyroxine (SYNTHROID) 50 MCG tablet  If she is she will need a new prescription  Call back [phone]

## 2019-08-23 NOTE — Telephone Encounter (Signed)
Resent to [facility], per pt.

## 2019-12-05 NOTE — Progress Notes (Signed)
Established patient visit   Patient: Heidi Yoder   DOB: [birth year]   86 y.o. Female  MRN: [id number] Visit Date: 12/09/2019  Today's healthcare provider: Megan Mans, MD   Chief Complaint  Patient presents with  . Hyperlipidemia   I,Heidi Yoder,acting as a scribe for Megan Mans, MD.,have documented all relevant documentation on the behalf of Megan Mans, MD,as directed by  Megan Mans, MD while in the presence of Megan Mans, MD.  Subjective    HPI  Overall patient is doing fairly well.  She is married and has 2 living sons and a daughter who died as a young adult in a motor vehicle accident.  She has 5 grandchildren and 12 great-grandchildren. Overall she is feeling fairly well.  She has less steady on her feet but has had no falls. She has not had a Covid vaccine and we talked at some length about this.  I recommended vaccine for her. Lipid/Cholesterol, follow-up  Last Lipid Panel: Lab Results  Component Value Date   CHOL 207 (H) 02/06/2019   LDLCALC 112 (H) 02/06/2019   HDL 58 02/06/2019   TRIG 217 (H) 02/06/2019    She was last seen for this 4 months ago.  Management since that visit includes; No changes.  She reports good compliance with treatment. She is not having side effects.  She is following a Regular diet. Current exercise: walking  Lightly  Last metabolic panel Lab Results  Component Value Date   GLUCOSE 85 02/06/2019   NA 141 02/06/2019   K 4.0 02/06/2019   BUN 21 02/06/2019   CREATININE 1.10 (H) 02/06/2019   GFRNONAA 46 (L) 02/06/2019   GFRAA 53 (L) 02/06/2019   CALCIUM 10.0 02/06/2019   AST 18 02/06/2019   ALT 16 02/06/2019   The ASCVD Risk score (Goff DC Jr., et al., 2013) failed to calculate for the following reasons:   The 2013 ASCVD risk score is only valid for ages 10 to 63  ---------------------------------------------------------------------------------------------------  Mild cognitive  impairment From 07/16/2019-Follow up at next visit .  We also discussed that it would probably be good for patient to get the Covid vaccine Discussed donepezil in this patient but she just wishes to wait for now for it.  Past Medical History:  Diagnosis Date  . GERD (gastroesophageal reflux disease)   . Hypertension   . Hypothyroidism   . Peripheral neuropathy        Medications: Outpatient Medications Prior to Visit  Medication Sig  . aspirin 81 MG tablet Take 81 mg by mouth daily.  . Calcium Carbonate-Vitamin D (CALCIUM HIGH POTENCY/VITAMIN D) 600-200 MG-UNIT TABS Take by mouth.  Marland Kitchen CALCIUM-VITAMIN D PO Take 600 mg by mouth 2 (two) times daily.   . cholecalciferol (VITAMIN D) 1000 UNITS tablet Take 1,000 Units by mouth daily.   . Cyanocobalamin (VITAMIN B 12 PO) Take 1,000 mcg by mouth daily.   Marland Kitchen docusate sodium (STOOL SOFTENER) 100 MG capsule Take 200 mg by mouth daily.   . hydrochlorothiazide (HYDRODIURIL) 25 MG tablet TAKE 1 TABLET BY MOUTH  DAILY  . Lactobacillus (ACIDOPHILUS) 100 MG CAPS Take 100 mg by mouth daily.  Marland Kitchen levothyroxine (SYNTHROID) 50 MCG tablet Take 1 tablet (50 mcg total) by mouth daily.  . MULTIPLE VITAMIN PO Take by mouth daily.   . Multiple Vitamins-Minerals (PRESERVISION AREDS 2 PO) Take 2 capsules by mouth daily.  Marland Kitchen omeprazole (PRILOSEC) 20 MG capsule Take 20 mg by  mouth daily.   . simvastatin (ZOCOR) 20 MG tablet Take 1 tablet (20 mg total) by mouth daily.  . hyoscyamine (LEVSIN, ANASPAZ) 0.125 MG tablet Take 2 tablets (0.25 mg total) by mouth 2 (two) times daily. (Patient not taking: Reported on 02/07/2018)  . oxybutynin (DITROPAN) 5 MG tablet TAKE 1 TABLET TWICE A DAY (Patient not taking: Reported on 02/07/2018)  . ranitidine (ZANTAC) 150 MG capsule Take 1 capsule (150 mg total) by mouth 2 (two) times daily. (Patient not taking: Reported on 04/11/2018)  . sucralfate (CARAFATE) 1 g tablet Take 1 tablet (1 g total) by mouth 4 (four) times daily -  with meals  and at bedtime. (Patient not taking: Reported on 01/22/2019)   No facility-administered medications prior to visit.    Review of Systems  Constitutional: Negative for appetite change, chills, fatigue and fever.  Respiratory: Negative for chest tightness and shortness of breath.   Cardiovascular: Negative for chest pain and palpitations.  Gastrointestinal: Negative for abdominal pain, nausea and vomiting.  Neurological: Negative for dizziness and weakness.       Objective    BP (!) 150/79 (BP Location: Right Arm, Patient Position: Sitting, Cuff Size: Normal)   Pulse 76   Temp 97.9 F (36.6 C) (Oral)   Ht 5' (1.524 m)   Wt 146 lb 12.8 oz (66.6 kg)   BMI 28.67 kg/m  BP Readings from Last 3 Encounters:  12/09/19 (!) 150/79  08/07/19 138/78  02/06/19 126/74   Wt Readings from Last 3 Encounters:  12/09/19 146 lb 12.8 oz (66.6 kg)  08/07/19 147 lb (66.7 kg)  02/06/19 147 lb (66.7 kg)      Physical Exam Vitals reviewed.  Constitutional:      Appearance: She is well-developed.  HENT:     Head: Normocephalic and atraumatic.     Right Ear: External ear normal.     Left Ear: External ear normal.     Nose: Nose normal.  Eyes:     General: No scleral icterus.    Conjunctiva/sclera: Conjunctivae normal.     Comments: Right pupil dilated,irregular.  Cardiovascular:     Rate and Rhythm: Normal rate and regular rhythm.     Heart sounds: Normal heart sounds.  Pulmonary:     Effort: Pulmonary effort is normal.     Breath sounds: Normal breath sounds.  Abdominal:     Palpations: Abdomen is soft.  Musculoskeletal:     Comments: Significant thoracic kyphosis.  Skin:    General: Skin is warm and dry.  Neurological:     General: No focal deficit present.     Mental Status: She is alert and oriented to person, place, and time.  Psychiatric:        Mood and Affect: Mood normal.        Behavior: Behavior normal.        Thought Content: Thought content normal.        Judgment:  Judgment normal.      General: Appearance:     Overweight female in no acute distress  Eyes:    PERRL, conjunctiva/corneas clear, EOM's intact       Lungs:     Clear to auscultation bilaterally, respirations unlabored  Heart:    Normal heart rate. Normal rhythm. No murmurs, rubs, or gallops.   MS:   All extremities are intact.   Neurologic:   Awake, alert, oriented x 3. No apparent focal neurological  defect.         No results found for any visits on 12/09/19.  Assessment & Plan    1. Essential (primary) hypertension Fair control.  Check blood pressure at home and this 86 year old - CBC with Differential - Comprehensive metabolic panel  2. Hypercholesteremia On Zocor.  Consider stopping this in the next couple of years.  3. Hypothyroidism, unspecified type On Synthroid 50 - TSH  4. Gastroesophageal reflux disease, unspecified whether esophagitis present Patient asymptomatic with this so we will try to stop omeprazole  5. Neuropathy   6. Osteopenia of lumbar spine Consider BMD in this patient who is status post hip fracture in the past.  Last BMD 2017 with osteopenia  7. Chronic bilateral low back pain without sciatica   8. B12 deficiency Check B12 level in the future  9. Avitaminosis D   10. OAB (overactive bladder) Stop oxybutynin to try to minimize side effects.  Follow clinically   Return in about 3 months (around 03/10/2020).         Kolten Ryback Wendelyn Breslow, MD  [facility] [phone] (phone) [phone] (fax)  [facility]

## 2019-12-09 ENCOUNTER — Ambulatory Visit (INDEPENDENT_AMBULATORY_CARE_PROVIDER_SITE_OTHER): Payer: Medicare Other | Admitting: Family Medicine

## 2019-12-09 ENCOUNTER — Other Ambulatory Visit: Payer: Self-pay

## 2019-12-09 ENCOUNTER — Encounter: Payer: Self-pay | Admitting: Family Medicine

## 2019-12-09 VITALS — BP 150/79 | HR 76 | Temp 97.9°F | Ht 60.0 in | Wt 146.8 lb

## 2019-12-09 DIAGNOSIS — I10 Essential (primary) hypertension: Secondary | ICD-10-CM

## 2019-12-09 DIAGNOSIS — K219 Gastro-esophageal reflux disease without esophagitis: Secondary | ICD-10-CM | POA: Diagnosis not present

## 2019-12-09 DIAGNOSIS — E039 Hypothyroidism, unspecified: Secondary | ICD-10-CM

## 2019-12-09 DIAGNOSIS — M545 Low back pain, unspecified: Secondary | ICD-10-CM

## 2019-12-09 DIAGNOSIS — G629 Polyneuropathy, unspecified: Secondary | ICD-10-CM

## 2019-12-09 DIAGNOSIS — E7800 Pure hypercholesterolemia, unspecified: Secondary | ICD-10-CM | POA: Diagnosis not present

## 2019-12-09 DIAGNOSIS — G3184 Mild cognitive impairment, so stated: Secondary | ICD-10-CM

## 2019-12-09 DIAGNOSIS — E559 Vitamin D deficiency, unspecified: Secondary | ICD-10-CM

## 2019-12-09 DIAGNOSIS — N3281 Overactive bladder: Secondary | ICD-10-CM

## 2019-12-09 DIAGNOSIS — E538 Deficiency of other specified B group vitamins: Secondary | ICD-10-CM

## 2019-12-09 DIAGNOSIS — G8929 Other chronic pain: Secondary | ICD-10-CM

## 2019-12-09 DIAGNOSIS — M8588 Other specified disorders of bone density and structure, other site: Secondary | ICD-10-CM

## 2019-12-09 NOTE — Patient Instructions (Signed)
Stop Omeprazole and Oxybutynin

## 2019-12-10 LAB — TSH: TSH: 1.68 u[IU]/mL (ref 0.450–4.500)

## 2019-12-10 LAB — CBC WITH DIFFERENTIAL/PLATELET
Basophils Absolute: 0.1 10*3/uL (ref 0.0–0.2)
Basos: 1 %
EOS (ABSOLUTE): 0.2 10*3/uL (ref 0.0–0.4)
Eos: 2 %
Hematocrit: 36.1 % (ref 34.0–46.6)
Hemoglobin: 11.8 g/dL (ref 11.1–15.9)
Immature Grans (Abs): 0 10*3/uL (ref 0.0–0.1)
Immature Granulocytes: 0 %
Lymphocytes Absolute: 4 10*3/uL — ABNORMAL HIGH (ref 0.7–3.1)
Lymphs: 42 %
MCH: 31.1 pg (ref 26.6–33.0)
MCHC: 32.7 g/dL (ref 31.5–35.7)
MCV: 95 fL (ref 79–97)
Monocytes Absolute: 0.7 10*3/uL (ref 0.1–0.9)
Monocytes: 7 %
Neutrophils Absolute: 4.7 10*3/uL (ref 1.4–7.0)
Neutrophils: 48 %
Platelets: 263 10*3/uL (ref 150–450)
RBC: 3.79 x10E6/uL (ref 3.77–5.28)
RDW: 12.3 % (ref 11.7–15.4)
WBC: 9.7 10*3/uL (ref 3.4–10.8)

## 2019-12-10 LAB — COMPREHENSIVE METABOLIC PANEL
ALT: 13 IU/L (ref 0–32)
AST: 18 IU/L (ref 0–40)
Albumin/Globulin Ratio: 1.4 (ref 1.2–2.2)
Albumin: 4 g/dL (ref 3.6–4.6)
Alkaline Phosphatase: 75 IU/L (ref 48–121)
BUN/Creatinine Ratio: 21 (ref 12–28)
BUN: 24 mg/dL (ref 8–27)
Bilirubin Total: 0.3 mg/dL (ref 0.0–1.2)
CO2: 24 mmol/L (ref 20–29)
Calcium: 9.9 mg/dL (ref 8.7–10.3)
Chloride: 101 mmol/L (ref 96–106)
Creatinine, Ser: 1.17 mg/dL — ABNORMAL HIGH (ref 0.57–1.00)
GFR calc Af Amer: 48 mL/min/{1.73_m2} — ABNORMAL LOW (ref >59)
GFR calc non Af Amer: 42 mL/min/{1.73_m2} — ABNORMAL LOW (ref >59)
Globulin, Total: 2.8 g/dL (ref 1.5–4.5)
Glucose: 98 mg/dL (ref 65–99)
Potassium: 3.7 mmol/L (ref 3.5–5.2)
Sodium: 140 mmol/L (ref 134–144)
Total Protein: 6.8 g/dL (ref 6.0–8.5)

## 2019-12-13 ENCOUNTER — Telehealth: Payer: Self-pay

## 2019-12-13 NOTE — Telephone Encounter (Signed)
Patient advised of lab results

## 2019-12-13 NOTE — Telephone Encounter (Signed)
-----   Message from Maple Hudson., MD sent at 12/12/2019 11:35 AM EDT ----- Labs stable.

## 2020-02-03 ENCOUNTER — Ambulatory Visit (INDEPENDENT_AMBULATORY_CARE_PROVIDER_SITE_OTHER): Payer: Medicare Other

## 2020-02-03 ENCOUNTER — Other Ambulatory Visit: Payer: Self-pay

## 2020-02-03 DIAGNOSIS — Z0000 Encounter for general adult medical examination without abnormal findings: Secondary | ICD-10-CM

## 2020-02-03 NOTE — Patient Instructions (Signed)
Heidi Yoder , Thank you for taking time to come for your Medicare Wellness Visit. I appreciate your ongoing commitment to your health goals. Please review the following plan we discussed and let me know if I can assist you in the future.   Screening recommendations/referrals: Colonoscopy: No longer required.  Mammogram: Up to date, due 02/2020 Bone Density: Up to date, due 08/2020 Recommended yearly ophthalmology/optometry visit for glaucoma screening and checkup Recommended yearly dental visit for hygiene and checkup  Vaccinations: Influenza vaccine: Currently due. Pneumococcal vaccine: Completed series Tdap vaccine: Currently due, declined at this time.  Shingles vaccine: Shingrix discussed. Please contact your pharmacy for coverage information.     Advanced directives: Please bring a copy of your POA (Power of Attorney) and/or Living Will to your next appointment.   Conditions/risks identified: Recommend to increase water intake to 6-8 8 oz glasses a day.   Next appointment: 03/05/20 @ 1:40 PM with Dr Sullivan Lone    Preventive Care 65 Years and Older, Female Preventive care refers to lifestyle choices and visits with your health care provider that can promote health and wellness. What does preventive care include?  A yearly physical exam. This is also called an annual well check.  Dental exams once or twice a year.  Routine eye exams. Ask your health care provider how often you should have your eyes checked.  Personal lifestyle choices, including:  Daily care of your teeth and gums.  Regular physical activity.  Eating a healthy diet.  Avoiding tobacco and drug use.  Limiting alcohol use.  Practicing safe sex.  Taking low-dose aspirin every day.  Taking vitamin and mineral supplements as recommended by your health care provider. What happens during an annual well check? The services and screenings done by your health care provider during your annual well check will  depend on your age, overall health, lifestyle risk factors, and family history of disease. Counseling  Your health care provider may ask you questions about your:  Alcohol use.  Tobacco use.  Drug use.  Emotional well-being.  Home and relationship well-being.  Sexual activity.  Eating habits.  History of falls.  Memory and ability to understand (cognition).  Work and work Astronomer.  Reproductive health. Screening  You may have the following tests or measurements:  Height, weight, and BMI.  Blood pressure.  Lipid and cholesterol levels. These may be checked every 5 years, or more frequently if you are over 6 years old.  Skin check.  Lung cancer screening. You may have this screening every year starting at age 84 if you have a 30-pack-year history of smoking and currently smoke or have quit within the past 15 years.  Fecal occult blood test (FOBT) of the stool. You may have this test every year starting at age 19.  Flexible sigmoidoscopy or colonoscopy. You may have a sigmoidoscopy every 5 years or a colonoscopy every 10 years starting at age 84.  Hepatitis C blood test.  Hepatitis B blood test.  Sexually transmitted disease (STD) testing.  Diabetes screening. This is done by checking your blood sugar (glucose) after you have not eaten for a while (fasting). You may have this done every 1-3 years.  Bone density scan. This is done to screen for osteoporosis. You may have this done starting at age 84.  Mammogram. This may be done every 1-2 years. Talk to your health care provider about how often you should have regular mammograms. Talk with your health care provider about your test results, treatment  options, and if necessary, the need for more tests. Vaccines  Your health care provider may recommend certain vaccines, such as:  Influenza vaccine. This is recommended every year.  Tetanus, diphtheria, and acellular pertussis (Tdap, Td) vaccine. You may need a  Td booster every 10 years.  Zoster vaccine. You may need this after age 55.  Pneumococcal 13-valent conjugate (PCV13) vaccine. One dose is recommended after age 11.  Pneumococcal polysaccharide (PPSV23) vaccine. One dose is recommended after age 5. Talk to your health care provider about which screenings and vaccines you need and how often you need them. This information is not intended to replace advice given to you by your health care provider. Make sure you discuss any questions you have with your health care provider. Document Released: 05/08/2015 Document Revised: 12/30/2015 Document Reviewed: 02/10/2015 Elsevier Interactive Patient Education  2017 [place] Prevention in the Home Falls can cause injuries. They can happen to people of all ages. There are many things you can do to make your home safe and to help prevent falls. What can I do on the outside of my home?  Regularly fix the edges of walkways and driveways and fix any cracks.  Remove anything that might make you trip as you walk through a door, such as a raised step or threshold.  Trim any bushes or trees on the path to your home.  Use bright outdoor lighting.  Clear any walking paths of anything that might make someone trip, such as rocks or tools.  Regularly check to see if handrails are loose or broken. Make sure that both sides of any steps have handrails.  Any raised decks and porches should have guardrails on the edges.  Have any leaves, snow, or ice cleared regularly.  Use sand or salt on walking paths during winter.  Clean up any spills in your garage right away. This includes oil or grease spills. What can I do in the bathroom?  Use night lights.  Install grab bars by the toilet and in the tub and shower. Do not use towel bars as grab bars.  Use non-skid mats or decals in the tub or shower.  If you need to sit down in the shower, use a plastic, non-slip stool.  Keep the floor dry. Clean  up any water that spills on the floor as soon as it happens.  Remove soap buildup in the tub or shower regularly.  Attach bath mats securely with double-sided non-slip rug tape.  Do not have throw rugs and other things on the floor that can make you trip. What can I do in the bedroom?  Use night lights.  Make sure that you have a light by your bed that is easy to reach.  Do not use any sheets or blankets that are too big for your bed. They should not hang down onto the floor.  Have a firm chair that has side arms. You can use this for support while you get dressed.  Do not have throw rugs and other things on the floor that can make you trip. What can I do in the kitchen?  Clean up any spills right away.  Avoid walking on wet floors.  Keep items that you use a lot in easy-to-reach places.  If you need to reach something above you, use a strong step stool that has a grab bar.  Keep electrical cords out of the way.  Do not use floor polish or wax that makes floors slippery.  If you must use wax, use non-skid floor wax.  Do not have throw rugs and other things on the floor that can make you trip. What can I do with my stairs?  Do not leave any items on the stairs.  Make sure that there are handrails on both sides of the stairs and use them. Fix handrails that are broken or loose. Make sure that handrails are as long as the stairways.  Check any carpeting to make sure that it is firmly attached to the stairs. Fix any carpet that is loose or worn.  Avoid having throw rugs at the top or bottom of the stairs. If you do have throw rugs, attach them to the floor with carpet tape.  Make sure that you have a light switch at the top of the stairs and the bottom of the stairs. If you do not have them, ask someone to add them for you. What else can I do to help prevent falls?  Wear shoes that:  Do not have high heels.  Have rubber bottoms.  Are comfortable and fit you well.  Are  closed at the toe. Do not wear sandals.  If you use a stepladder:  Make sure that it is fully opened. Do not climb a closed stepladder.  Make sure that both sides of the stepladder are locked into place.  Ask someone to hold it for you, if possible.  Clearly mark and make sure that you can see:  Any grab bars or handrails.  First and last steps.  Where the edge of each step is.  Use tools that help you move around (mobility aids) if they are needed. These include:  Canes.  Walkers.  Scooters.  Crutches.  Turn on the lights when you go into a dark area. Replace any light bulbs as soon as they burn out.  Set up your furniture so you have a clear path. Avoid moving your furniture around.  If any of your floors are uneven, fix them.  If there are any pets around you, be aware of where they are.  Review your medicines with your doctor. Some medicines can make you feel dizzy. This can increase your chance of falling. Ask your doctor what other things that you can do to help prevent falls. This information is not intended to replace advice given to you by your health care provider. Make sure you discuss any questions you have with your health care provider. Document Released: 02/05/2009 Document Revised: 09/17/2015 Document Reviewed: 05/16/2014 Elsevier Interactive Patient Education  2017 [employer].

## 2020-02-03 NOTE — Progress Notes (Signed)
Subjective:   Heidi Yoder is a 86 y.o. female who presents for Medicare Annual (Subsequent) preventive examination.  I connected with Heidi Yoder today by telephone and verified that I am speaking with the correct person using two identifiers. Location patient: home Location provider: work Persons participating in the virtual visit: patient, provider.   I discussed the limitations, risks, security and privacy concerns of performing an evaluation and management service by telephone and the availability of in person appointments. I also discussed with the patient that there may be a patient responsible charge related to this service. The patient expressed understanding and verbally consented to this telephonic visit.    Interactive audio and video telecommunications were attempted between this provider and patient, however failed, due to patient having technical difficulties OR patient did not have access to video capability.  We continued and completed visit with audio only.   Review of Systems    N/A  Cardiac Risk Factors include: advanced age (>16men, >39 women);dyslipidemia;hypertension     Objective:    There were no vitals filed for this visit. There is no height or weight on file to calculate BMI.  Advanced Directives 02/03/2020 01/22/2019 01/16/2018 12/29/2016 09/29/2015 04/03/2015 04/03/2015  Does Patient Have a Medical Advance Directive? No Yes Yes Yes Yes Yes No  Type of Advance Directive - Healthcare Power of [place];Living will Living will Living will Healthcare Power of [place];Living will Living will -  Copy of Healthcare Power of Attorney in Chart? - No - copy requested - - No - copy requested - -  Would patient like information on creating a medical advance directive? No - Patient declined - - - - - -    Current Medications (verified) Outpatient Encounter Medications as of 02/03/2020  Medication Sig  . aspirin 81 MG tablet Take 81 mg by mouth daily.  Marland Kitchen  CALCIUM-VITAMIN D PO Take 600 mg by mouth 2 (two) times daily.   . cholecalciferol (VITAMIN D) 1000 UNITS tablet Take 1,000 Units by mouth daily.   . Cyanocobalamin (VITAMIN B 12 PO) Take 1,000 mcg by mouth daily.   Marland Kitchen docusate sodium (STOOL SOFTENER) 100 MG capsule Take 200 mg by mouth daily.   . hydrochlorothiazide (HYDRODIURIL) 25 MG tablet TAKE 1 TABLET BY MOUTH  DAILY  . Lactobacillus (ACIDOPHILUS) 100 MG CAPS Take 100 mg by mouth daily.  Marland Kitchen levothyroxine (SYNTHROID) 50 MCG tablet Take 1 tablet (50 mcg total) by mouth daily.  . MULTIPLE VITAMIN PO Take by mouth daily.   . Multiple Vitamins-Minerals (PRESERVISION AREDS 2 PO) Take 2 capsules by mouth daily.  Marland Kitchen omeprazole (PRILOSEC) 20 MG capsule Take 20 mg by mouth daily.   . simvastatin (ZOCOR) 20 MG tablet Take 1 tablet (20 mg total) by mouth daily.  . Calcium Carbonate-Vitamin D (CALCIUM HIGH POTENCY/VITAMIN D) 600-200 MG-UNIT TABS Take by mouth. (Patient not taking: Reported on 02/03/2020)  . hyoscyamine (LEVSIN, ANASPAZ) 0.125 MG tablet Take 2 tablets (0.25 mg total) by mouth 2 (two) times daily. (Patient not taking: Reported on 02/07/2018)  . oxybutynin (DITROPAN) 5 MG tablet TAKE 1 TABLET TWICE A DAY (Patient not taking: Reported on 02/07/2018)  . ranitidine (ZANTAC) 150 MG capsule Take 1 capsule (150 mg total) by mouth 2 (two) times daily. (Patient not taking: Reported on 04/11/2018)  . sucralfate (CARAFATE) 1 g tablet Take 1 tablet (1 g total) by mouth 4 (four) times daily -  with meals and at bedtime. (Patient not taking: Reported on 01/22/2019)  No facility-administered encounter medications on file as of 02/03/2020.    Allergies (verified) Patient has no known allergies.   History: Past Medical History:  Diagnosis Date  . GERD (gastroesophageal reflux disease)   . Hypertension   . Hypothyroidism   . Peripheral neuropathy    Past Surgical History:  Procedure Laterality Date  . ABDOMINAL HYSTERECTOMY     due to cervical  hyperlasia  . BACK SURGERY     spinal stenosis and back surgery  . CATARACT EXTRACTION Bilateral   . HIP PINNING,CANNULATED Left 04/04/2015   Procedure: CANNULATED HIP PINNING;  Surgeon: Deeann Saint, MD;  Location: [facility] ORS;  Service: Orthopedics;  Laterality: Left;  . UPPER GI ENDOSCOPY  10/17/03   normal duodenum, normal esophagus, normal stomach   Family History  Problem Relation Age of Onset  . Ovarian cancer Mother   . Hypertension Sister   . Colon polyps Sister   . Cancer Sister        breast  . Heart disease Brother   . Colon polyps Brother   . Dementia Brother   . CVA Father    Social History   Socioeconomic History  . Marital status: Widowed    Spouse name: Not on file  . Number of children: 2  . Years of education: Not on file  . Highest education level: High school graduate  Occupational History  . Not on file  Tobacco Use  . Smoking status: Never Smoker  . Smokeless tobacco: Never Used  Vaping Use  . Vaping Use: Never used  Substance and Sexual Activity  . Alcohol use: No  . Drug use: No  . Sexual activity: Not on file  Other Topics Concern  . Not on file  Social History Narrative  . Not on file   Social Determinants of Health   Financial Resource Strain: Low Risk   . Difficulty of Paying Living Expenses: Not hard at all  Food Insecurity: No Food Insecurity  . Worried About Programme researcher, broadcasting/film/video in the Last Year: Never true  . Ran Out of Food in the Last Year: Never true  Transportation Needs: No Transportation Needs  . Lack of Transportation (Medical): No  . Lack of Transportation (Non-Medical): No  Physical Activity: Inactive  . Days of Exercise per Week: 0 days  . Minutes of Exercise per Session: 0 min  Stress: No Stress Concern Present  . Feeling of Stress : Not at all  Social Connections: Moderately Integrated  . Frequency of Communication with Friends and Family: More than three times a week  . Frequency of Social Gatherings with Friends  and Family: More than three times a week  . Attends Religious Services: More than 4 times per year  . Active Member of Clubs or Organizations: No  . Attends Banker Meetings: Never  . Marital Status: Married    Tobacco Counseling Counseling given: Not Answered   Clinical Intake:  Pre-visit preparation completed: Yes  Pain : No/denies pain     Nutritional Risks: None Diabetes: No  How often do you need to have someone help you when you read instructions, pamphlets, or other written materials from your doctor or pharmacy?: 1 - Never  Diabetic? No  Interpreter Needed?: No  Information entered by :: [facility], LPN   Activities of Daily Living In your present state of health, do you have any difficulty performing the following activities: 02/03/2020  Hearing? N  Vision? N  Difficulty concentrating or making decisions? N  Walking or climbing stairs? N  Dressing or bathing? N  Doing errands, shopping? Y  Comment Does not drive.  Preparing Food and eating ? N  Using the Toilet? N  In the past six months, have you accidently leaked urine? N  Do you have problems with loss of bowel control? N  Managing your Medications? N  Managing your Finances? N  Housekeeping or managing your Housekeeping? N  Some recent data might be hidden    Patient Care Team: Maple HudsonGilbert, Richard L Jr., MD as PCP - General (Family Medicine)  Indicate any recent Medical Services you may have received from other than [facility] providers in the past year (date may be approximate).     Assessment:   This is a routine wellness examination for [name].  Hearing/Vision screen No exam data present  Dietary issues and exercise activities discussed: Current Exercise Habits: The patient does not participate in regular exercise at present, Exercise limited by: orthopedic condition(s)  Goals    . Increase water intake     Recommend increasing water intake to 4 glasses a day.       Depression  Screen PHQ 2/9 Scores 02/03/2020 01/22/2019 01/16/2018 01/16/2018 12/29/2016 12/16/2015 08/10/2015  PHQ - 2 Score 0 0 0 0 0 0 0  PHQ- 9 Score - - 1 - 0 - -    Fall Risk Fall Risk  02/03/2020 12/09/2019 01/22/2019 01/16/2018 12/29/2016  Falls in the past year? 0 0 0 No No  Number falls in past yr: 0 0 0 - -  Injury with Fall? 0 0 0 - -    Any stairs in or around the home? No  If so, are there any without handrails? No  Home free of loose throw rugs in walkways, pet beds, electrical cords, etc? Yes  Adequate lighting in your home to reduce risk of falls? Yes   ASSISTIVE DEVICES UTILIZED TO PREVENT FALLS:  Life alert? No  Use of a cane, walker or w/c? Yes  Grab bars in the bathroom? Yes  Shower chair or bench in shower? Yes  Elevated toilet seat or a handicapped toilet? No    Cognitive Function: MMSE - Mini Mental State Exam 08/07/2019  Orientation to time 5  Orientation to Place 4  Registration 3  Attention/ Calculation 5  Recall 3  Language- name 2 objects 2  Language- repeat 1  Language- follow 3 step command 3  Language- read & follow direction 1  Write a sentence 1  Copy design 1  Total score 29     6CIT Screen 02/03/2020 01/22/2019 01/16/2018 12/29/2016  What Year? 0 points 0 points 0 points 0 points  What month? 0 points 0 points 0 points 0 points  What time? 0 points 0 points 0 points 0 points  Count back from 20 0 points 0 points 0 points 0 points  Months in reverse 0 points 0 points 0 points 0 points  Repeat phrase 10 points 6 points 4 points 0 points  Total Score 10 6 4  0    Immunizations Immunization History  Administered Date(s) Administered  . Fluad Quad(high Dose 65+) 02/06/2019  . Influenza, High Dose Seasonal PF 02/09/2015, 12/29/2016, 01/16/2018  . Pneumococcal Conjugate-13 08/07/2013  . Pneumococcal Polysaccharide-23 05/03/2000  . Td 12/18/2003    TDAP status: Due, Education has been provided regarding the importance of this vaccine. Advised may receive  this vaccine at local pharmacy or Health Dept. Aware to provide a copy of the vaccination record if  obtained from local pharmacy or Health Dept. Verbalized acceptance and understanding. Flu Vaccine status: Declined, Education has been provided regarding the importance of this vaccine but patient still declined. Advised may receive this vaccine at local pharmacy or Health Dept. Aware to provide a copy of the vaccination record if obtained from local pharmacy or Health Dept. Verbalized acceptance and understanding. Pneumococcal vaccine status: Up to date Covid-19 vaccine status: Declined, Education has been provided regarding the importance of this vaccine but patient still declined. Advised may receive this vaccine at local pharmacy or Health Dept.or vaccine clinic. Aware to provide a copy of the vaccination record if obtained from local pharmacy or Health Dept. Verbalized acceptance and understanding.  Qualifies for Shingles Vaccine? Yes   Zostavax completed No   Shingrix Completed?: No.    Education has been provided regarding the importance of this vaccine. Patient has been advised to call insurance company to determine out of pocket expense if they have not yet received this vaccine. Advised may also receive vaccine at local pharmacy or Health Dept. Verbalized acceptance and understanding.  Screening Tests Health Maintenance  Topic Date Due  . COVID-19 Vaccine (1) Never done  . INFLUENZA VACCINE  11/24/2019  . TETANUS/TDAP  02/02/2021 (Originally 12/17/2013)  . MAMMOGRAM  03/11/2020  . DEXA SCAN  08/24/2020  . PNA vac Low Risk Adult  Completed    Health Maintenance  Health Maintenance Due  Topic Date Due  . COVID-19 Vaccine (1) Never done  . INFLUENZA VACCINE  11/24/2019    Colorectal cancer screening: No longer required.  Mammogram status: Completed 03/12/19. Repeat every year Bone Density status: Completed 08/25/15. Results reflect: Bone density results: OSTEOPENIA. Repeat every 5  years.  Lung Cancer Screening: (Low Dose CT Chest recommended if Age 63-80 years, 30 pack-year currently smoking OR have quit w/in 15years.) does not qualify.    Additional Screening:  Vision Screening: Recommended annual ophthalmology exams for early detection of glaucoma and other disorders of the eye. Is the patient up to date with their annual eye exam?  Yes  Who is the provider or what is the name of the office in which the patient attends annual eye exams? [facility] If pt is not established with a provider, would they like to be referred to a provider to establish care? No .   Dental Screening: Recommended annual dental exams for proper oral hygiene  Community Resource Referral / Chronic Care Management: CRR required this visit?  No   CCM required this visit?  No      Plan:     I have personally reviewed and noted the following in the patient's chart:   . Medical and social history . Use of alcohol, tobacco or illicit drugs  . Current medications and supplements . Functional ability and status . Nutritional status . Physical activity . Advanced directives . List of other physicians . Hospitalizations, surgeries, and ER visits in previous 12 months . Vitals . Screenings to include cognitive, depression, and falls . Referrals and appointments  In addition, I have reviewed and discussed with patient certain preventive protocols, quality metrics, and best practice recommendations. A written personalized care plan for preventive services as well as general preventive health recommendations were provided to patient.     Cheyenna Pankowski [place], [place]   [id number]/91/[id number]   Nurse Notes: Pt to receive a flu shot at next in office apt. Pt to discuss receiving the Covid vaccines with PCP.

## 2020-03-05 ENCOUNTER — Other Ambulatory Visit: Payer: Self-pay

## 2020-03-05 ENCOUNTER — Encounter: Payer: Self-pay | Admitting: Family Medicine

## 2020-03-05 ENCOUNTER — Ambulatory Visit: Payer: Medicare Other | Admitting: Family Medicine

## 2020-03-05 VITALS — BP 125/61 | HR 71 | Temp 97.5°F | Resp 18 | Ht 60.0 in | Wt 146.0 lb

## 2020-03-05 DIAGNOSIS — Z23 Encounter for immunization: Secondary | ICD-10-CM

## 2020-03-05 DIAGNOSIS — I10 Essential (primary) hypertension: Secondary | ICD-10-CM | POA: Diagnosis not present

## 2020-03-05 NOTE — Progress Notes (Signed)
I,[name],acting as a scribe for Megan Mans, MD.,have documented all relevant documentation on the behalf of Megan Mans, MD,as directed by  Megan Mans, MD while in the presence of Megan Mans, MD.   Established patient visit   Patient: Heidi Yoder   DOB: [birth year]   86 y.o. Female  MRN: [id number] Visit Date: 03/05/2020  Today's healthcare provider: Megan Mans, MD   Chief Complaint  Patient presents with  . Follow-up  . Gastroesophageal Reflux  . Hyperlipidemia  . Hypertension   Subjective    HPI  Patient comes in today for recheck.  She feels well.  She has not had any Covid vaccines.  All of her issues are stable and she is taking her medications. Hypertension, follow-up  BP Readings from Last 3 Encounters:  03/05/20 125/61  12/09/19 (!) 150/79  08/07/19 138/78   Wt Readings from Last 3 Encounters:  03/05/20 146 lb (66.2 kg)  12/09/19 146 lb 12.8 oz (66.6 kg)  08/07/19 147 lb (66.7 kg)     She was last seen for hypertension 3 months ago.  BP at that visit was 150/79. Management since that visit includes; Fair control.  Check blood pressure at home and this 86 year old. She reports good compliance with treatment. She is not having side effects. none She is not exercising. She is adherent to low salt diet.   Outside blood pressures are does not remember.  She does not smoke.  Use of agents associated with hypertension: none.   --------------------------------------------------------------------  Lipid/Cholesterol, follow-up  Last Lipid Panel: Lab Results  Component Value Date   CHOL 207 (H) 02/06/2019   LDLCALC 112 (H) 02/06/2019   HDL 58 02/06/2019   TRIG 217 (H) 02/06/2019    She was last seen for this 3 months ago.  Management since that visit includes; On Zocor.  Consider stopping this in the next couple of years. She reports good compliance with treatment. She is not having side effects.  none She is following a Regular, Low Sodium diet. Current exercise: none  Last metabolic panel Lab Results  Component Value Date   GLUCOSE 98 12/09/2019   NA 140 12/09/2019   K 3.7 12/09/2019   BUN 24 12/09/2019   CREATININE 1.17 (H) 12/09/2019   GFRNONAA 42 (L) 12/09/2019   GFRAA 48 (L) 12/09/2019   CALCIUM 9.9 12/09/2019   AST 18 12/09/2019   ALT 13 12/09/2019   The ASCVD Risk score (Goff DC Jr., et al., 2013) failed to calculate for the following reasons:   The 2013 ASCVD risk score is only valid for ages 39 to 45  --------------------------------------------------------------------  Gastroesophageal reflux disease, unspecified whether esophagitis present From 12/09/2019-Patient asymptomatic with this so we will try to stop omeprazole  Osteopenia of lumbar spine From 12/09/2019-Consider BMD in this patient who is status post hip fracture in the past.  Last BMD 2017 with osteopenia.  B12 deficiency From 12/09/2019-Check B12 level in the future  OAB (overactive bladder) From 12/09/2019-Stopped oxybutynin to try to minimize side effects.  Follow clinically.        Medications: Outpatient Medications Prior to Visit  Medication Sig  . aspirin 81 MG tablet Take 81 mg by mouth daily.  . cholecalciferol (VITAMIN D) 1000 UNITS tablet Take 1,000 Units by mouth daily.   . Cyanocobalamin (VITAMIN B 12 PO) Take 1,000 mcg by mouth daily.   Marland Kitchen docusate sodium (STOOL SOFTENER) 100 MG capsule Take 200 mg by mouth daily.   Marland Kitchen  levothyroxine (SYNTHROID) 50 MCG tablet Take 1 tablet (50 mcg total) by mouth daily.  . MULTIPLE VITAMIN PO Take by mouth daily.   . Multiple Vitamins-Minerals (PRESERVISION AREDS 2 PO) Take 2 capsules by mouth daily.  . simvastatin (ZOCOR) 20 MG tablet Take 1 tablet (20 mg total) by mouth daily.  . Calcium Carbonate-Vitamin D (CALCIUM HIGH POTENCY/VITAMIN D) 600-200 MG-UNIT TABS Take by mouth. (Patient not taking: Reported on 02/03/2020)  . CALCIUM-VITAMIN D  PO Take 600 mg by mouth 2 (two) times daily.   . hydrochlorothiazide (HYDRODIURIL) 25 MG tablet TAKE 1 TABLET BY MOUTH  DAILY  . hyoscyamine (LEVSIN, ANASPAZ) 0.125 MG tablet Take 2 tablets (0.25 mg total) by mouth 2 (two) times daily. (Patient not taking: Reported on 02/07/2018)  . Lactobacillus (ACIDOPHILUS) 100 MG CAPS Take 100 mg by mouth daily.  Marland Kitchen omeprazole (PRILOSEC) 20 MG capsule Take 20 mg by mouth daily.   Marland Kitchen oxybutynin (DITROPAN) 5 MG tablet TAKE 1 TABLET TWICE A DAY (Patient not taking: Reported on 02/07/2018)  . ranitidine (ZANTAC) 150 MG capsule Take 1 capsule (150 mg total) by mouth 2 (two) times daily. (Patient not taking: Reported on 04/11/2018)  . sucralfate (CARAFATE) 1 g tablet Take 1 tablet (1 g total) by mouth 4 (four) times daily -  with meals and at bedtime. (Patient not taking: Reported on 01/22/2019)   No facility-administered medications prior to visit.    Review of Systems  Constitutional: Negative for appetite change, chills, fatigue and fever.  Respiratory: Negative for chest tightness and shortness of breath.   Cardiovascular: Negative for chest pain and palpitations.  Gastrointestinal: Negative for abdominal pain, nausea and vomiting.  Neurological: Negative for dizziness and weakness.       Objective    BP 125/61 (BP Location: Left Arm, Patient Position: Sitting, Cuff Size: Normal)   Pulse 71   Temp (!) 97.5 F (36.4 C) (Oral)   Resp 18   Ht 5' (1.524 m)   Wt 146 lb (66.2 kg)   SpO2 97%   BMI 28.51 kg/m  BP Readings from Last 3 Encounters:  03/05/20 125/61  12/09/19 (!) 150/79  08/07/19 138/78   Wt Readings from Last 3 Encounters:  03/05/20 146 lb (66.2 kg)  12/09/19 146 lb 12.8 oz (66.6 kg)  08/07/19 147 lb (66.7 kg)      Physical Exam Vitals reviewed.  Constitutional:      Appearance: She is well-developed.  HENT:     Head: Normocephalic and atraumatic.     Right Ear: External ear normal.     Left Ear: External ear normal.      Nose: Nose normal.  Eyes:     General: No scleral icterus.    Conjunctiva/sclera: Conjunctivae normal.     Comments: Right pupil dilated,irregular.  Cardiovascular:     Rate and Rhythm: Normal rate and regular rhythm.     Heart sounds: Normal heart sounds.  Pulmonary:     Effort: Pulmonary effort is normal.     Breath sounds: Normal breath sounds.  Abdominal:     Palpations: Abdomen is soft.  Musculoskeletal:     Comments: Significant thoracic kyphosis.  Skin:    General: Skin is warm and dry.  Neurological:     General: No focal deficit present.     Mental Status: She is alert and oriented to person, place, and time.  Psychiatric:        Mood and Affect: Mood normal.  Behavior: Behavior normal.        Thought Content: Thought content normal.        Judgment: Judgment normal.       No results found for any visits on 03/05/20.  Assessment & Plan     1. Essential (primary) hypertension Follow-up 6 months.  Consider stopping since simvastatin in the future in  this soon-to-be 86 year old.  2. Encounter for immunization Recommend COVID  vaccine and flu shot - [employer] Vaccine  3. Need for immunization against influenza  - Flu Vaccine QUAD High Dose(Fluad)    Return in about 4 months (around 07/03/2020).         Janson Lamar Wendelyn Breslow, MD  [facility] [phone] (phone) [phone] (fax)  [facility]

## 2020-03-20 ENCOUNTER — Ambulatory Visit: Payer: Self-pay | Admitting: Family Medicine

## 2020-03-20 NOTE — Telephone Encounter (Signed)
Ret'd call to pt's. Son.  Reported that he wanted to know if the pt. rec'd a COVID vaccine at her last appt.  Stated the pt. Said she rec'd a flu vaccine, and then said she can't remember if she rec'd a COVID vaccine.  Confirmed with son that both the COVID and Flu vaccines were given to pt. At appt. On 03/05/20.  Son verb. Concern that pt. Is getting more forgetful and having increased episodes of confusion.  Stated he has noticed this decline over past 2-3 months.  Stated the pt. Lives alone.  Son shared that his mother does not want to move from her home.  Asking if the change in her memory and confusion is age-related or if it could be caused by something else.  Also reported she is having problems with controlling her bladder.  Explained she wears pads, and he doesn't know if she just forgets that she needs to get up to go to [place].  Spends much of day in a recliner.  Questioned what his options are to help her, but keep her in her own home.  Advised will start with appt. With PCP.  Son does not want pt. To know he called and reported these concerns, stating "it would devastate her."    Appt. Scheduled 04/07/20 with Dr. Rosanna Randy.  Son advised he will need to be present with pt. At the appt.  Asked that this nurse call the pt. And advise of the upcoming appt., and not to make the pt. Aware that he called.  Advised will contact pt.  Also advised that Triage note will be sent to PCP for his review.  Agreed with plan.    Reason for Disposition  New or worsening memory (forgetfulness) problems  Answer Assessment - Initial Assessment Questions 1. MAIN CONCERN OR SYMPTOM:  "What is your main concern right now?" "What questions do you have?" "What's the main symptom you're worried about?" (e.g., confusion, memory loss)     More forgetful, intermittent confusion over past 2-3 mos.  2. ONSET:  "When did the symptom start (or worsen)?" (minutes, hours, days, weeks)     Past 2-3 months 3. BETTER-SAME-WORSE: "Are  you getting better, staying the same, or getting worse compared to the day you were discharged?"     Gradual worsening x 2 - 3 mos.  4. DIAGNOSIS: "Was patient diagnosed with dementia by a doctor?" If Yes, ask: "When?" (e.g., days, months, years ago)     Son is not aware if this has been diagnosed 5. MEDICATION: "Has there been any change in medications recently?" (e.g., narcotics, antihistamines, benzodiazepines, etc.)     denied 6. OTHER SYMPTOMS: "Are there any other symptoms?" (e.g., fever, cough, pain, falling)     Son reports she is having more difficulty controlling her bladder/ incontinent; increased confusion at times, increasing forgetfulness; walks with a cane, but sometimes doesn't use it 7. SUPPORT: Document living circumstances and support (e.g., family, nursing home)        Lives in own home, alone.  Son and daughter-in-law, and another son check on her, and offer support in the home, and with transportation  Protocols used: DEMENTIA SYMPTOMS AND [place]  Message from Sharene Skeans sent at 03/20/2020 9:43 AM EST  Summary: covid vaccine    Pts son is calling to speak with a nurse about covid vaccine and other questions / he didn't state other concerns / please advise

## 2020-03-23 NOTE — Telephone Encounter (Signed)
Noted  

## 2020-03-25 ENCOUNTER — Telehealth: Payer: Self-pay

## 2020-03-25 NOTE — Telephone Encounter (Signed)
Patient's son scheduled this appointment.

## 2020-03-25 NOTE — Telephone Encounter (Signed)
Copied from CRM [id number]. Topic: General - Other >> Mar 25, 2020 11:43 AM Jaquita Rector A wrote: Reason for CRM: Patient called in very confused about her appointments scheduled for 04/07/20 and 04/08/20 stated that she know nothing about these appointments. She will speak to her son and have to call in.

## 2020-04-07 ENCOUNTER — Encounter: Payer: Self-pay | Admitting: Family Medicine

## 2020-04-07 ENCOUNTER — Other Ambulatory Visit: Payer: Self-pay

## 2020-04-07 ENCOUNTER — Ambulatory Visit: Payer: Medicare Other | Admitting: Family Medicine

## 2020-04-07 VITALS — BP 106/60 | HR 67 | Temp 98.7°F | Resp 16 | Wt 148.0 lb

## 2020-04-07 DIAGNOSIS — I10 Essential (primary) hypertension: Secondary | ICD-10-CM

## 2020-04-07 DIAGNOSIS — M4135 Thoracogenic scoliosis, thoracolumbar region: Secondary | ICD-10-CM | POA: Diagnosis not present

## 2020-04-07 DIAGNOSIS — E538 Deficiency of other specified B group vitamins: Secondary | ICD-10-CM

## 2020-04-07 DIAGNOSIS — G3184 Mild cognitive impairment, so stated: Secondary | ICD-10-CM

## 2020-04-07 DIAGNOSIS — E559 Vitamin D deficiency, unspecified: Secondary | ICD-10-CM

## 2020-04-07 DIAGNOSIS — M545 Low back pain, unspecified: Secondary | ICD-10-CM

## 2020-04-07 DIAGNOSIS — G629 Polyneuropathy, unspecified: Secondary | ICD-10-CM | POA: Diagnosis not present

## 2020-04-07 DIAGNOSIS — G8929 Other chronic pain: Secondary | ICD-10-CM

## 2020-04-07 DIAGNOSIS — E7800 Pure hypercholesterolemia, unspecified: Secondary | ICD-10-CM

## 2020-04-07 NOTE — Progress Notes (Signed)
Established patient visit   Patient: Heidi Yoder   DOB: [birth year]   86 y.o. Female  MRN: [id number] Visit Date: 04/07/2020  Today's healthcare provider: Megan Mans, MD   Chief Complaint  Patient presents with  . Memory Loss   Subjective    HPI  Patient here today c/o memory problems.  Patient is brought in by her son.  She continues to live alone but her sons check on her.  She does not drive.  Overall she is doing fairly well.  6 CIT is 16 which is up from 10 couple of months ago which is up from 6  15 months ago.  She has had no recent falls.  She walks with a cane.  6CIT Screen 04/07/2020 02/03/2020 01/22/2019 01/16/2018 12/29/2016  What Year? 4 points 0 points 0 points 0 points 0 points  What month? 0 points 0 points 0 points 0 points 0 points  What time? 0 points 0 points 0 points 0 points 0 points  Count back from 20 0 points 0 points 0 points 0 points 0 points  Months in reverse 4 points 0 points 0 points 0 points 0 points  Repeat phrase 8 points 10 points 6 points 4 points 0 points  Total Score 16 10 6 4  0    MMSE - Mini Mental State Exam 04/07/2020 08/07/2019  Orientation to time 3 5  Orientation to Place 5 4  Registration 3 3  Attention/ Calculation 5 5  Recall 2 3  Language- name 2 objects 2 2  Language- repeat 0 1  Language- follow 3 step command 3 3  Language- read & follow direction 1 1  Write a sentence 1 1  Copy design 1 1  Total score 26 29     Patient Active Problem List   Diagnosis Date Noted  . Closed left hip fracture (HCC) 04/03/2015  . Allergic rhinitis 02/06/2015  . Back pain, chronic 02/06/2015  . CAFL (chronic airflow limitation) (HCC) 02/06/2015  . Essential (primary) hypertension 02/06/2015  . GERD (gastroesophageal reflux disease) 02/06/2015  . History of colon polyps 02/06/2015  . Hypercholesteremia 02/06/2015  . Hypothyroidism 02/06/2015  . Arthritis, degenerative 02/06/2015  . Neuropathy 02/06/2015  . Osteopenia  02/06/2015  . Post menopausal syndrome 02/06/2015  . Scoliosis 02/06/2015  . Female stress incontinence 02/06/2015  . B12 deficiency 02/06/2015  . Avitaminosis D 02/06/2015   Social History   Socioeconomic History  . Marital status: Widowed    Spouse name: Not on file  . Number of children: 2  . Years of education: Not on file  . Highest education level: High school graduate  Occupational History  . Not on file  Tobacco Use  . Smoking status: Never Smoker  . Smokeless tobacco: Never Used  Vaping Use  . Vaping Use: Never used  Substance and Sexual Activity  . Alcohol use: No  . Drug use: No  . Sexual activity: Not on file  Other Topics Concern  . Not on file  Social History Narrative  . Not on file   Social Determinants of Health   Financial Resource Strain: Low Risk   . Difficulty of Paying Living Expenses: Not hard at all  Food Insecurity: No Food Insecurity  . Worried About 02/08/2015 in the Last Year: Never true  . Ran Out of Food in the Last Year: Never true  Transportation Needs: No Transportation Needs  . Lack of Transportation (Medical): No  .  Lack of Transportation (Non-Medical): No  Physical Activity: Inactive  . Days of Exercise per Week: 0 days  . Minutes of Exercise per Session: 0 min  Stress: No Stress Concern Present  . Feeling of Stress : Not at all  Social Connections: Moderately Integrated  . Frequency of Communication with Friends and Family: More than three times a week  . Frequency of Social Gatherings with Friends and Family: More than three times a week  . Attends Religious Services: More than 4 times per year  . Active Member of Clubs or Organizations: No  . Attends Banker Meetings: Never  . Marital Status: Married  Catering manager Violence: Not At Risk  . Fear of Current or Ex-Partner: No  . Emotionally Abused: No  . Physically Abused: No  . Sexually Abused: No   No Known Allergies      Medications: Outpatient Medications Prior to Visit  Medication Sig  . aspirin 81 MG tablet Take 81 mg by mouth daily.  Marland Kitchen CALCIUM-VITAMIN D PO Take 600 mg by mouth 2 (two) times daily.   . cholecalciferol (VITAMIN D) 1000 UNITS tablet Take 1,000 Units by mouth daily.   . Cyanocobalamin (VITAMIN B 12 PO) Take 1,000 mcg by mouth daily.   Marland Kitchen docusate sodium (COLACE) 100 MG capsule Take 200 mg by mouth daily.   . Lactobacillus (ACIDOPHILUS) 100 MG CAPS Take 100 mg by mouth daily.  Marland Kitchen levothyroxine (SYNTHROID) 50 MCG tablet Take 1 tablet (50 mcg total) by mouth daily.  . Multiple Vitamins-Minerals (PRESERVISION AREDS 2 PO) Take 2 capsules by mouth daily.  Marland Kitchen omeprazole (PRILOSEC) 20 MG capsule Take 20 mg by mouth daily.   Marland Kitchen oxybutynin (DITROPAN) 5 MG tablet TAKE 1 TABLET TWICE A DAY  . ranitidine (ZANTAC) 150 MG capsule Take 1 capsule (150 mg total) by mouth 2 (two) times daily.  . simvastatin (ZOCOR) 20 MG tablet Take 1 tablet (20 mg total) by mouth daily.  . sucralfate (CARAFATE) 1 g tablet Take 1 tablet (1 g total) by mouth 4 (four) times daily -  with meals and at bedtime.  . hydrochlorothiazide (HYDRODIURIL) 25 MG tablet TAKE 1 TABLET BY MOUTH  DAILY  . hyoscyamine (LEVSIN, ANASPAZ) 0.125 MG tablet Take 2 tablets (0.25 mg total) by mouth 2 (two) times daily. (Patient not taking: No sig reported)  . [DISCONTINUED] Calcium Carbonate-Vitamin D (CALCIUM HIGH POTENCY/VITAMIN D) 600-200 MG-UNIT TABS Take by mouth. (Patient not taking: Reported on 02/03/2020)  . [DISCONTINUED] MULTIPLE VITAMIN PO Take by mouth daily.    No facility-administered medications prior to visit.    Review of Systems  Constitutional: Negative.   Cardiovascular: Negative.   Psychiatric/Behavioral: Positive for confusion.    Last CBC Lab Results  Component Value Date   WBC 9.7 12/09/2019   HGB 11.8 12/09/2019   HCT 36.1 12/09/2019   MCV 95 12/09/2019   MCH 31.1 12/09/2019   RDW 12.3 12/09/2019   PLT 263  12/09/2019   Last thyroid functions Lab Results  Component Value Date   TSH 1.680 12/09/2019   Last vitamin B12 and Folate Lab Results  Component Value Date   VITAMINB12 1,071 (H) 04/07/2015      Objective    BP 106/60 (BP Location: Right Arm, Patient Position: Sitting, Cuff Size: Normal)   Pulse 67   Temp 98.7 F (37.1 C) (Oral)   Resp 16   Wt 148 lb (67.1 kg)   SpO2 97%   BMI 28.90 kg/m  BP Readings  from Last 3 Encounters:  04/07/20 106/60  03/05/20 125/61  12/09/19 (!) 150/79   Wt Readings from Last 3 Encounters:  04/07/20 148 lb (67.1 kg)  03/05/20 146 lb (66.2 kg)  12/09/19 146 lb 12.8 oz (66.6 kg)      Physical Exam Vitals reviewed.  Constitutional:      Appearance: She is well-developed.  HENT:     Head: Normocephalic and atraumatic.     Right Ear: External ear normal.     Left Ear: External ear normal.     Nose: Nose normal.  Eyes:     General: No scleral icterus.    Conjunctiva/sclera: Conjunctivae normal.     Comments: Right pupil dilated,irregular.  Cardiovascular:     Rate and Rhythm: Normal rate and regular rhythm.     Heart sounds: Normal heart sounds.  Pulmonary:     Effort: Pulmonary effort is normal.     Breath sounds: Normal breath sounds.  Abdominal:     Palpations: Abdomen is soft.  Musculoskeletal:     Comments: Significant thoracic kyphosis.  Skin:    General: Skin is warm and dry.  Neurological:     General: No focal deficit present.     Mental Status: She is alert and oriented to person, place, and time.  Psychiatric:        Mood and Affect: Mood normal.        Behavior: Behavior normal.        Thought Content: Thought content normal.        Judgment: Judgment normal.       No results found for any visits on 04/07/20.  Assessment & Plan     1. MCI (mild cognitive impairment) MMSE today is a 26 which is down from 29 on last check. We will follow in 4 to 6 months to recheck MMSE.  No treatment today.  Continue  present regimen.  Consider donepezil 2. Essential (primary) hypertension Trolled on HCTZ  3. Neuropathy   4. Thoracogenic scoliosis of thoracolumbar region   5. Hypercholesteremia On simvastatin 20  6. B12 deficiency   7. Avitaminosis D   8. Chronic bilateral low back pain without sciatica She now walks with a cane and does well with this.   No follow-ups on file.         Rada Zegers Wendelyn Breslow, MD  [facility] [phone] (phone) [phone] (fax)  [facility]

## 2020-04-08 ENCOUNTER — Ambulatory Visit: Payer: Self-pay

## 2020-04-22 ENCOUNTER — Other Ambulatory Visit: Payer: Self-pay | Admitting: Family Medicine

## 2020-04-22 NOTE — Telephone Encounter (Signed)
Medication Refill - Medication: simvastatin (ZOCOR) 20 MG tablet Pt is out of medication   Has the patient contacted their pharmacy? No. (Agent: If no, request that the patient contact the pharmacy for the refill.) (Agent: If yes, when and what did the pharmacy advise?)  Preferred Pharmacy (with phone number or street name):  [id number] [address] Phone:  [phone]  Fax:  [phone]       Agent: Please be advised that RX refills may take up to 3 business days. We ask that you follow-up with your pharmacy.

## 2020-04-22 NOTE — Telephone Encounter (Signed)
Requested medication (s) are due for refill today  yes  Requested medication (s) are on the active medication list yes  Future visit scheduled yes.  Note to clinic-failed lab requirement, last lipid profile was 02/06/19.  LOV 04/07/20  Routing to clinic for approval.       Requested Prescriptions  Pending Prescriptions Disp Refills   simvastatin (ZOCOR) 20 MG tablet 90 tablet 3    Sig: Take 1 tablet (20 mg total) by mouth daily.      Cardiovascular:  Antilipid - Statins Failed - 04/22/2020 12:09 PM      Failed - Total Cholesterol in normal range and within 360 days    Cholesterol, Total  Date Value Ref Range Status  02/06/2019 207 (H) 100 - 199 mg/dL Final          Failed - LDL in normal range and within 360 days    LDL Cholesterol (Calc)  Date Value Ref Range Status  01/02/2017 102 (H) mg/dL (calc) Final    Comment:    Reference range: <100 . Desirable range <100 mg/dL for primary prevention;   <70 mg/dL for patients with CHD or diabetic patients  with > or = 2 CHD risk factors. Marland Kitchen LDL-C is now calculated using the Martin-Hopkins  calculation, which is a validated novel method providing  better accuracy than the Friedewald equation in the  estimation of LDL-C.  Horald Pollen et al. Lenox Ahr. 1610;960(45): 2061-2068  (http://education.[employer].com/faq/FAQ164)    LDL Chol Calc (NIH)  Date Value Ref Range Status  02/06/2019 112 (H) 0 - 99 mg/dL Final          Failed - HDL in normal range and within 360 days    HDL  Date Value Ref Range Status  02/06/2019 58 >39 mg/dL Final          Failed - Triglycerides in normal range and within 360 days    Triglycerides  Date Value Ref Range Status  02/06/2019 217 (H) 0 - 149 mg/dL Final          Passed - Patient is not pregnant      Passed - Valid encounter within last 12 months    Recent Outpatient Visits           2 weeks ago MCI (mild cognitive impairment)   [facility] Maple Hudson.,  MD   1 month ago Essential (primary) hypertension   [facility] Maple Hudson., MD   4 months ago Essential (primary) hypertension   [facility] Maple Hudson., MD   8 months ago Hypercholesteremia   [facility] Maple Hudson., MD   1 year ago Essential (primary) hypertension   [facility] Maple Hudson., MD       Future Appointments             In 3 months Maple Hudson., MD [facility]   In 4 months Maple Hudson., MD [facility]

## 2020-04-23 MED ORDER — SIMVASTATIN 20 MG PO TABS
20.0000 mg | ORAL_TABLET | Freq: Every day | ORAL | 1 refills | Status: DC
Start: 2020-04-23 — End: 2020-05-12

## 2020-04-23 NOTE — Telephone Encounter (Signed)
Pt called back about her refill request for Simvastatin/ Pt is out of this medication and [facility]/ Pt asked to  please advise when they have been sent to pharmacy

## 2020-05-12 ENCOUNTER — Other Ambulatory Visit: Payer: Self-pay | Admitting: Family Medicine

## 2020-05-12 MED ORDER — SIMVASTATIN 20 MG PO TABS
20.0000 mg | ORAL_TABLET | Freq: Every day | ORAL | 1 refills | Status: DC
Start: 2020-05-12 — End: 2020-10-15

## 2020-05-12 NOTE — Telephone Encounter (Signed)
Medication Refill - Medication: simvastatin (ZOCOR) 20 MG tablet     Preferred Pharmacy (with phone number or street name):  [facility] - [place], [place] - [address], [address] Phone:  [phone]  Fax:  [phone]       Agent: Please be advised that RX refills may take up to 3 business days. We ask that you follow-up with your pharmacy.

## 2020-05-12 NOTE — Telephone Encounter (Signed)
Future visit in 2 months. Last labs 02/06/2019

## 2020-06-12 ENCOUNTER — Telehealth: Payer: Self-pay

## 2020-06-12 NOTE — Telephone Encounter (Signed)
Copied from CRM [id number]. Topic: General - Inquiry >> Jun 12, 2020 10:24 AM Adrian Prince D wrote: Reason for CRM: Patient's son would like a call back from the doctor or his nurse. He would like to conversation to be between you and him. The call is in reference to his mother. He can be reached at [phone].

## 2020-06-15 NOTE — Telephone Encounter (Signed)
Spoke with patients son who stated that he had concerns that patients memory issues were worsening. Patient was last seen in office on 12/14 and at visit it was mentioned to consider patient on Donepezil , MMSE dropped from a 29 to a 26. Patients son states that patient has been forgetful, difficulty remembering tasks, issues with urinary incontinence, and now running into troubles handling her finances. I advised son that a office visit would be appropriate to address and for patient to be evaluated, he states that he does not want patient to know why she is coming in office for and does not want to discuss in the room when patient is present because he is afraid that she will get upset and deny memory issues shes been struggling with. Advised son that appointment will be marked as a follow up and I will let provider be aware of request by son. Appointment has been scheduled for 2/23. KW

## 2020-06-17 ENCOUNTER — Ambulatory Visit (INDEPENDENT_AMBULATORY_CARE_PROVIDER_SITE_OTHER): Payer: Medicare Other | Admitting: Family Medicine

## 2020-06-17 ENCOUNTER — Other Ambulatory Visit: Payer: Self-pay

## 2020-06-17 ENCOUNTER — Encounter: Payer: Self-pay | Admitting: Family Medicine

## 2020-06-17 VITALS — BP 127/76 | HR 77 | Temp 97.7°F | Resp 18 | Wt 148.0 lb

## 2020-06-17 DIAGNOSIS — N393 Stress incontinence (female) (male): Secondary | ICD-10-CM

## 2020-06-17 DIAGNOSIS — E7800 Pure hypercholesterolemia, unspecified: Secondary | ICD-10-CM

## 2020-06-17 DIAGNOSIS — G629 Polyneuropathy, unspecified: Secondary | ICD-10-CM

## 2020-06-17 DIAGNOSIS — G3184 Mild cognitive impairment, so stated: Secondary | ICD-10-CM

## 2020-06-17 DIAGNOSIS — I10 Essential (primary) hypertension: Secondary | ICD-10-CM

## 2020-06-17 DIAGNOSIS — M4135 Thoracogenic scoliosis, thoracolumbar region: Secondary | ICD-10-CM

## 2020-06-17 DIAGNOSIS — E039 Hypothyroidism, unspecified: Secondary | ICD-10-CM

## 2020-06-17 DIAGNOSIS — G8929 Other chronic pain: Secondary | ICD-10-CM

## 2020-06-17 DIAGNOSIS — M545 Low back pain, unspecified: Secondary | ICD-10-CM

## 2020-06-17 MED ORDER — DONEPEZIL HCL 5 MG PO TABS: 5.0000 mg | ORAL_TABLET | Freq: Every day | ORAL | 4 refills | Status: DC

## 2020-06-17 NOTE — Progress Notes (Signed)
I,[name],acting as a scribe for Megan Mans, MD.,have documented all relevant documentation on the behalf of Megan Mans, MD,as directed by  Megan Mans, MD while in the presence of Megan Mans, MD.   Established patient visit   Patient: Heidi Yoder   DOB: [birth year]   87 y.o. Female  MRN: [id number] Visit Date: 06/17/2020  Today's healthcare provider: Megan Mans, MD   Chief Complaint  Patient presents with  . Follow-up  . Hypertension   Subjective    HPI  Patient is brought in by her son as she continues to have mild cognitive allergies at home.  Mainly she does not remember conversations and repeats herself and this frustrates the son.  She is no longer driving. The other problem is 1 of ongoing urinary incontinence.  He wears pads but leaks through the pads.  He has never tried to bend.  He has no dysuria or other urinary symptoms.  MCI (mild cognitive impairment) From 04/07/2021- MMSE today is a 26 which is down from 29 on last check. We will follow in 4 to 6 months to recheck MMSE.  No treatment today.  Continue present regimen.  Consider donepezil.  Hypertension, follow-up  BP Readings from Last 3 Encounters:  06/17/20 127/76  04/07/20 106/60  03/05/20 125/61   Wt Readings from Last 3 Encounters:  06/17/20 148 lb (67.1 kg)  04/07/20 148 lb (67.1 kg)  03/05/20 146 lb (66.2 kg)     She was last seen for hypertension 2 months ago.  BP at that visit was 106/61. Management since that visit includes; Follow-up 6 months.  Consider stopping since simvastatin in the future in  this soon-to-be 87 year old. She reports good compliance with treatment. She is not having side effects. none She is not exercising. She is adherent to low salt diet.   Outside blood pressures are not checking.  She does not smoke.  Use of agents associated with hypertension: none.    --------------------------------------------------------------------   Medications: Outpatient Medications Prior to Visit  Medication Sig  . aspirin 81 MG tablet Take 81 mg by mouth daily.  Marland Kitchen CALCIUM-VITAMIN D PO Take 600 mg by mouth 2 (two) times daily.   . cholecalciferol (VITAMIN D) 1000 UNITS tablet Take 1,000 Units by mouth daily.   . Cyanocobalamin (VITAMIN B 12 PO) Take 1,000 mcg by mouth daily.   Marland Kitchen docusate sodium (COLACE) 100 MG capsule Take 200 mg by mouth daily.   . hydrochlorothiazide (HYDRODIURIL) 25 MG tablet TAKE 1 TABLET BY MOUTH  DAILY  . Lactobacillus (ACIDOPHILUS) 100 MG CAPS Take 100 mg by mouth daily.  Marland Kitchen levothyroxine (SYNTHROID) 50 MCG tablet Take 1 tablet (50 mcg total) by mouth daily.  . Multiple Vitamins-Minerals (PRESERVISION AREDS 2 PO) Take 2 capsules by mouth daily.  Marland Kitchen omeprazole (PRILOSEC) 20 MG capsule Take 20 mg by mouth daily.   Marland Kitchen oxybutynin (DITROPAN) 5 MG tablet TAKE 1 TABLET TWICE A DAY  . ranitidine (ZANTAC) 150 MG capsule Take 1 capsule (150 mg total) by mouth 2 (two) times daily.  . simvastatin (ZOCOR) 20 MG tablet Take 1 tablet (20 mg total) by mouth daily.  . sucralfate (CARAFATE) 1 g tablet Take 1 tablet (1 g total) by mouth 4 (four) times daily -  with meals and at bedtime.  . hyoscyamine (LEVSIN, ANASPAZ) 0.125 MG tablet Take 2 tablets (0.25 mg total) by mouth 2 (two) times daily. (Patient not taking: No sig reported)  No facility-administered medications prior to visit.    Review of Systems  Constitutional: Negative for appetite change, chills, fatigue and fever.  Respiratory: Negative for chest tightness and shortness of breath.   Cardiovascular: Negative for chest pain and palpitations.  Gastrointestinal: Negative for abdominal pain, nausea and vomiting.  Neurological: Negative for dizziness and weakness.       Objective    BP 127/76 (BP Location: Left Arm, Patient Position: Sitting, Cuff Size: Normal)   Pulse 77   Temp 97.7 F  (36.5 C) (Oral)   Resp 18   Wt 148 lb (67.1 kg)   SpO2 95%   BMI 28.90 kg/m     MMSE - Mini Mental State Exam 06/17/2020 04/07/2020 08/07/2019  Orientation to time 4 3 5   Orientation to Place 5 5 4   Registration 3 3 3   Attention/ Calculation 5 5 5   Recall 3 2 3   Language- name 2 objects 2 2 2   Language- repeat 1 0 1  Language- follow 3 step command 3 3 3   Language- read & follow direction 1 1 1   Write a sentence 1 1 1   Copy design 1 1 1   Total score 29 26 29     Physical Exam Vitals reviewed.  Constitutional:      Appearance: She is well-developed.  HENT:     Head: Normocephalic and atraumatic.     Right Ear: External ear normal.     Left Ear: External ear normal.     Nose: Nose normal.  Eyes:     General: No scleral icterus.    Conjunctiva/sclera: Conjunctivae normal.     Comments: Right pupil dilated,irregular.  Cardiovascular:     Rate and Rhythm: Normal rate and regular rhythm.     Heart sounds: Normal heart sounds.  Pulmonary:     Effort: Pulmonary effort is normal.     Breath sounds: Normal breath sounds.  Abdominal:     Palpations: Abdomen is soft.  Musculoskeletal:     Comments: Significant thoracic kyphosis.  Skin:    General: Skin is warm and dry.  Neurological:     General: No focal deficit present.     Mental Status: She is alert and oriented to person, place, and time.  Psychiatric:        Mood and Affect: Mood normal.        Behavior: Behavior normal.        Thought Content: Thought content normal.        Judgment: Judgment normal.   PHQ-9 2   No results found for any visits on 06/17/20.  Assessment & Plan     1. MCI (mild cognitive impairment) MMSE is surprisingly 29/30 today. - donepezil (ARICEPT) 5 MG tablet; Take 1 tablet (5 mg total) by mouth at bedtime.  Dispense: 30 tablet; Refill: 4  2. Female stress incontinence Try depends daily. No  Work-up at this point.  3. Essential (primary) hypertension Good control  4.  Hypothyroidism, unspecified type   5. Neuropathy   6. Thoracogenic scoliosis of thoracolumbar region   7. Hypercholesteremia   8. Chronic bilateral low back pain without sciatica    Return in about 3 months (around 09/14/2020).      I, , MD, have reviewed all documentation for this visit. The documentation on 06/21/20 for the exam, diagnosis, procedures, and orders are all accurate and complete.    Richard , MD  [facility] [phone] (phone) [phone] (fax)  [facility]

## 2020-06-17 NOTE — Patient Instructions (Signed)
Starts wearing Depends.

## 2020-06-23 ENCOUNTER — Telehealth: Payer: Self-pay

## 2020-06-23 ENCOUNTER — Other Ambulatory Visit: Payer: Self-pay | Admitting: Family Medicine

## 2020-06-23 DIAGNOSIS — E039 Hypothyroidism, unspecified: Secondary | ICD-10-CM

## 2020-06-23 NOTE — Telephone Encounter (Signed)
Please advise 

## 2020-06-23 NOTE — Telephone Encounter (Signed)
Patient called stating she is getting up a lot at night to urinate and cant hold it to make it to bathroom.  She wanted something called in to help "stop her from going as often".  [facility] [address]

## 2020-06-24 NOTE — Telephone Encounter (Signed)
Check urine CNS.  No meds that  stop that.

## 2020-06-24 NOTE — Telephone Encounter (Signed)
LMOVM for pt to return. Patient can come by the office and leave a sample or make an appointment.

## 2020-07-08 ENCOUNTER — Telehealth: Payer: Self-pay | Admitting: Family Medicine

## 2020-07-08 NOTE — Telephone Encounter (Signed)
When this letter is completed, please call the POA Lauralyn Primes Sanborn to let him know so he can pick it up [phone].

## 2020-07-08 NOTE — Telephone Encounter (Signed)
Pt states she is setting up her POA and medical POA.  They daughter in law states it is sometimes helpful to have the dr write a letter stating the pt was of sound mind when she signed thes documents.  They want to know if Dr Sullivan Lone will write this letter for her.

## 2020-07-08 NOTE — Telephone Encounter (Signed)
Please advise 

## 2020-07-09 ENCOUNTER — Encounter: Payer: Self-pay | Admitting: *Deleted

## 2020-07-09 NOTE — Telephone Encounter (Signed)
Okay to write letter patient is of sound mind to make decision about power of attorney

## 2020-07-09 NOTE — Telephone Encounter (Signed)
Charlott Holler letter is ready.

## 2020-07-25 ENCOUNTER — Other Ambulatory Visit: Payer: Self-pay | Admitting: Family Medicine

## 2020-07-26 NOTE — Telephone Encounter (Signed)
Requested Prescriptions  Pending Prescriptions Disp Refills  . hydrochlorothiazide (HYDRODIURIL) 25 MG tablet [Pharmacy Med Name: hydroCHLOROthiazide 25 MG Oral Tablet] 90 tablet 3    Sig: TAKE 1 TABLET BY MOUTH  DAILY     Cardiovascular: Diuretics - Thiazide Failed - 07/25/2020 10:31 PM      Failed - Cr in normal range and within 360 days    Creat  Date Value Ref Range Status  01/02/2017 1.01 (H) 0.60 - 0.88 mg/dL Final    Comment:    For patients >29 years of age, the reference limit for Creatinine is approximately 13% higher for people identified as African-American. .    Creatinine, Ser  Date Value Ref Range Status  12/09/2019 1.17 (H) 0.57 - 1.00 mg/dL Final         Passed - Ca in normal range and within 360 days    Calcium  Date Value Ref Range Status  12/09/2019 9.9 8.7 - 10.3 mg/dL Final   Calcium, Ion  Date Value Ref Range Status  04/11/2018 5.1 4.5 - 5.6 mg/dL Final         Passed - K in normal range and within 360 days    Potassium  Date Value Ref Range Status  12/09/2019 3.7 3.5 - 5.2 mmol/L Final         Passed - Na in normal range and within 360 days    Sodium  Date Value Ref Range Status  12/09/2019 140 134 - 144 mmol/L Final         Passed - Last BP in normal range    BP Readings from Last 1 Encounters:  06/17/20 127/76         Passed - Valid encounter within last 6 months    Recent Outpatient Visits          1 month ago MCI (mild cognitive impairment)   [facility] Maple Hudson., MD   3 months ago MCI (mild cognitive impairment)   [facility] Maple Hudson., MD   4 months ago Essential (primary) hypertension   [facility] Maple Hudson., MD   7 months ago Essential (primary) hypertension   [facility] Maple Hudson., MD   11 months ago Hypercholesteremia   [facility] Maple Hudson., MD      Future Appointments             In 1 month Maple Hudson., MD [facility]

## 2020-08-06 ENCOUNTER — Ambulatory Visit: Payer: Self-pay | Admitting: Family Medicine

## 2020-09-07 ENCOUNTER — Ambulatory Visit: Payer: Self-pay | Admitting: Family Medicine

## 2020-09-17 ENCOUNTER — Ambulatory Visit: Payer: Self-pay | Admitting: Family Medicine

## 2020-10-05 ENCOUNTER — Telehealth: Payer: Self-pay

## 2020-10-05 NOTE — Telephone Encounter (Signed)
Copied from CRM [id number]. Topic: General - Other >> Oct 05, 2020 12:00 PM [name] wrote: Reason for CRM: Pt called regarding her appt on 10/07/20. She states that her son is coming to the appt with her, but that she does not feel comfortable with him speaking with the doctor and being in the same room while she is being examined. Pt is looking for a way to not have her son in the room. Please advise.

## 2020-10-05 NOTE — Telephone Encounter (Signed)
Advised patient that per her request, we will ask son to wait in the lobby while she is being seen. Patient does not want her son to be in the room. Appt note updated.

## 2020-10-07 ENCOUNTER — Encounter: Payer: Self-pay | Admitting: Family Medicine

## 2020-10-07 ENCOUNTER — Other Ambulatory Visit: Payer: Self-pay

## 2020-10-07 ENCOUNTER — Ambulatory Visit: Payer: Medicare Other | Admitting: Family Medicine

## 2020-10-07 VITALS — BP 138/75 | HR 70 | Temp 98.6°F | Resp 16 | Ht 60.0 in | Wt 142.0 lb

## 2020-10-07 DIAGNOSIS — G3184 Mild cognitive impairment, so stated: Secondary | ICD-10-CM | POA: Diagnosis not present

## 2020-10-07 DIAGNOSIS — E039 Hypothyroidism, unspecified: Secondary | ICD-10-CM

## 2020-10-07 DIAGNOSIS — M545 Low back pain, unspecified: Secondary | ICD-10-CM

## 2020-10-07 DIAGNOSIS — I10 Essential (primary) hypertension: Secondary | ICD-10-CM

## 2020-10-07 DIAGNOSIS — E538 Deficiency of other specified B group vitamins: Secondary | ICD-10-CM

## 2020-10-07 DIAGNOSIS — E559 Vitamin D deficiency, unspecified: Secondary | ICD-10-CM

## 2020-10-07 DIAGNOSIS — K219 Gastro-esophageal reflux disease without esophagitis: Secondary | ICD-10-CM

## 2020-10-07 DIAGNOSIS — G8929 Other chronic pain: Secondary | ICD-10-CM

## 2020-10-07 DIAGNOSIS — M8588 Other specified disorders of bone density and structure, other site: Secondary | ICD-10-CM

## 2020-10-07 DIAGNOSIS — M4135 Thoracogenic scoliosis, thoracolumbar region: Secondary | ICD-10-CM

## 2020-10-07 NOTE — Progress Notes (Signed)
Established patient visit   Patient: Heidi Yoder   DOB: 13-Mar-1930   87 y.o. Female  MRN: [id number] Visit Date: 10/07/2020  Today's healthcare provider: Megan Mans, MD   Chief Complaint  Patient presents with   Hypertension   Hyperlipidemia   Subjective    HPI  Patient comes in today for follow-up.  Everything appears to be stable and the patient states she is taking her medications.  She has had no falls.  Hypertension, follow-up  BP Readings from Last 3 Encounters:  10/07/20 138/75  06/17/20 127/76  04/07/20 106/60   Wt Readings from Last 3 Encounters:  10/07/20 142 lb (64.4 kg)  06/17/20 148 lb (67.1 kg)  04/07/20 148 lb (67.1 kg)     She was last seen for hypertension 4 months ago.  BP at that visit was 127/76. Management since that visit includes no medication changes.  She reports good compliance with treatment. She is not having side effects.  She is following a Regular diet. She is not exercising. She does not smoke.  Use of agents associated with hypertension: none.   Outside blood pressures are checked occasionally. Symptoms: No chest pain No chest pressure  No palpitations No syncope  No dyspnea No orthopnea  No paroxysmal nocturnal dyspnea No lower extremity edema   Pertinent labs: Lab Results  Component Value Date   CHOL 207 (H) 02/06/2019   HDL 58 02/06/2019   LDLCALC 112 (H) 02/06/2019   TRIG 217 (H) 02/06/2019   CHOLHDL 3.6 02/06/2019   Lab Results  Component Value Date   NA 140 12/09/2019   K 3.7 12/09/2019   CREATININE 1.17 (H) 12/09/2019   GFRNONAA 42 (L) 12/09/2019   GFRAA 48 (L) 12/09/2019   GLUCOSE 98 12/09/2019     The ASCVD Risk score (Goff DC Jr., et al., 2013) failed to calculate for the following reasons:   The 2013 ASCVD risk score is only valid for ages 75 to 24  Lipid/Cholesterol, Follow-up  Last lipid panel Other pertinent labs  Lab Results  Component Value Date   CHOL 207 (H) 02/06/2019    HDL 58 02/06/2019   LDLCALC 112 (H) 02/06/2019   TRIG 217 (H) 02/06/2019   CHOLHDL 3.6 02/06/2019   Lab Results  Component Value Date   ALT 13 12/09/2019   AST 18 12/09/2019   PLT 263 12/09/2019   TSH 1.680 12/09/2019     She was last seen for this 4 months ago.  Management since that visit includes no medicationchanges.  She reports good compliance with treatment. She is not having side effects.   Symptoms: No chest pain No chest pressure/discomfort  No dyspnea No lower extremity edema  No numbness or tingling of extremity No orthopnea  No palpitations No paroxysmal nocturnal dyspnea  No speech difficulty No syncope    Follow up for mild cognitive impairment   The patient was last seen for this 4 months ago. Changes made at last visit include continuing Aricept 5mg  daily. MMSE on last visit was 29/30.   She reports good compliance with treatment. She feels that condition is Unchanged. She is not having side effects.        Medications: Outpatient Medications Prior to Visit  Medication Sig   aspirin 81 MG tablet Take 81 mg by mouth daily.   CALCIUM-VITAMIN D PO Take 600 mg by mouth 2 (two) times daily.    cholecalciferol (VITAMIN D) 1000 UNITS tablet Take 1,000 Units  by mouth daily.    Cyanocobalamin (VITAMIN B 12 PO) Take 1,000 mcg by mouth daily.    docusate sodium (COLACE) 100 MG capsule Take 200 mg by mouth daily.    donepezil (ARICEPT) 5 MG tablet Take 1 tablet (5 mg total) by mouth at bedtime.   hydrochlorothiazide (HYDRODIURIL) 25 MG tablet TAKE 1 TABLET BY MOUTH  DAILY   hyoscyamine (LEVSIN, ANASPAZ) 0.125 MG tablet Take 2 tablets (0.25 mg total) by mouth 2 (two) times daily. (Patient not taking: No sig reported)   Lactobacillus (ACIDOPHILUS) 100 MG CAPS Take 100 mg by mouth daily.   levothyroxine (SYNTHROID) 50 MCG tablet TAKE 1 TABLET BY MOUTH  DAILY   Multiple Vitamins-Minerals (PRESERVISION AREDS 2 PO) Take 2 capsules by mouth daily.   omeprazole  (PRILOSEC) 20 MG capsule Take 20 mg by mouth daily.    oxybutynin (DITROPAN) 5 MG tablet TAKE 1 TABLET TWICE A DAY   ranitidine (ZANTAC) 150 MG capsule Take 1 capsule (150 mg total) by mouth 2 (two) times daily.   simvastatin (ZOCOR) 20 MG tablet Take 1 tablet (20 mg total) by mouth daily.   sucralfate (CARAFATE) 1 g tablet Take 1 tablet (1 g total) by mouth 4 (four) times daily -  with meals and at bedtime.   No facility-administered medications prior to visit.    Review of Systems      Objective    BP 138/75   Pulse 70   Temp 98.6 F (37 C)   Resp 16   Ht 5' (1.524 m)   Wt 142 lb (64.4 kg)   BMI 27.73 kg/m  BP Readings from Last 3 Encounters:  10/07/20 138/75  06/17/20 127/76  04/07/20 106/60   Wt Readings from Last 3 Encounters:  10/07/20 142 lb (64.4 kg)  06/17/20 148 lb (67.1 kg)  04/07/20 148 lb (67.1 kg)       Physical Exam Vitals reviewed.  Constitutional:      Appearance: She is well-developed.  HENT:     Head: Normocephalic and atraumatic.     Right Ear: External ear normal.     Left Ear: External ear normal.     Nose: Nose normal.  Eyes:     General: No scleral icterus.    Conjunctiva/sclera: Conjunctivae normal.     Comments: Right pupil dilated,irregular.  Cardiovascular:     Rate and Rhythm: Normal rate and regular rhythm.     Heart sounds: Normal heart sounds.  Pulmonary:     Effort: Pulmonary effort is normal.     Breath sounds: Normal breath sounds.  Abdominal:     Palpations: Abdomen is soft.  Musculoskeletal:     Comments: Significant thoracic kyphosis.  Skin:    General: Skin is warm and dry.  Neurological:     General: No focal deficit present.     Mental Status: She is alert and oriented to person, place, and time.  Psychiatric:        Mood and Affect: Mood normal.        Behavior: Behavior normal.      No results found for any visits on 10/07/20.  Assessment & Plan     1. MCI (mild cognitive impairment) Stable but almost  certainly progressive.  Have strongly advised the patient not to drive. Consider increasing Aricept to maximum dose Due to MCI with actually stop her oxybutynin as this could make things worse. 2. Essential (primary) hypertension Controlled at this time.  3. Hypothyroidism, unspecified type Follow  TSH level yearly  4. Gastroesophageal reflux disease, unspecified whether esophagitis present Requiring PPI and sucralfate, presently asymptomatic  5. Thoracogenic scoliosis of thoracolumbar region   6. Osteopenia of lumbar spine   7. Chronic bilateral low back pain without sciatica Chronic back problems .  8. B12 deficiency  continue B12 OTC  9. Avitaminosis D Continue vitamin D and calcium daily   No follow-ups on file.      I, Megan Mans, MD, have reviewed all documentation for this visit. The documentation on 10/14/20 for the exam, diagnosis, procedures, and orders are all accurate and complete.    Jamespaul Secrist Wendelyn Breslow, MD  [facility] [phone] (phone) [phone] (fax)  [facility]

## 2020-10-07 NOTE — Patient Instructions (Signed)
Stop Oxybutynin  ?

## 2020-10-08 ENCOUNTER — Telehealth: Payer: Self-pay

## 2020-10-08 NOTE — Telephone Encounter (Signed)
Review of patient's DPR reveals that this son is not listed.  She updated the DPR on 07/08/20.  Son Reita Cliche was not on previous DPR either.      Copied from CRM [id number]. Topic: General - Other >> Oct 08, 2020  4:10 PM Marylen Ponto wrote: Reason for CRM: Pt son Reita Cliche called to inquire about pt appt on 10/07/20 Reita Cliche stated pt added him to the list of people who could receive medical information on pt. Bobby requests call back at [phone]

## 2020-10-15 ENCOUNTER — Other Ambulatory Visit: Payer: Self-pay | Admitting: Family Medicine

## 2020-11-07 ENCOUNTER — Other Ambulatory Visit: Payer: Self-pay | Admitting: Family Medicine

## 2020-11-07 DIAGNOSIS — G3184 Mild cognitive impairment, so stated: Secondary | ICD-10-CM

## 2020-11-07 NOTE — Telephone Encounter (Signed)
Requested Prescriptions  Pending Prescriptions Disp Refills  . donepezil (ARICEPT) 5 MG tablet [Pharmacy Med Name: DONEPEZIL HCL 5 MG TABLET] 30 tablet 4    Sig: TAKE 1 TABLET BY MOUTH EVERYDAY AT BEDTIME     Neurology:  Alzheimer's Agents Passed - 11/07/2020  3:13 PM      Passed - Valid encounter within last 6 months    Recent Outpatient Visits          1 month ago MCI (mild cognitive impairment)   [facility] Maple Hudson., MD   4 months ago MCI (mild cognitive impairment)   [facility] Maple Hudson., MD   7 months ago MCI (mild cognitive impairment)    [facility] Maple Hudson., MD   8 months ago Essential (primary) hypertension   [facility] Maple Hudson., MD   11 months ago Essential (primary) hypertension   [facility] Maple Hudson., MD      Future Appointments            In 3 months Maple Hudson., MD [facility], [facility]

## 2020-11-15 ENCOUNTER — Other Ambulatory Visit: Payer: Self-pay | Admitting: Family Medicine

## 2020-12-20 ENCOUNTER — Other Ambulatory Visit: Payer: Self-pay | Admitting: Family Medicine

## 2020-12-20 DIAGNOSIS — E039 Hypothyroidism, unspecified: Secondary | ICD-10-CM

## 2020-12-21 NOTE — Telephone Encounter (Signed)
Requested medication (s) are due for refill today:   Yes  Requested medication (s) are on the active medication list:   Yes  Future visit scheduled:   Yes 02/09/2021 with Sullivan Lone   Last ordered: 06/23/2020 #90, 1 refill  Returned because labs due.  Provider to review for refills prior to appt.   Requested Prescriptions  Pending Prescriptions Disp Refills   levothyroxine (SYNTHROID) 50 MCG tablet [Pharmacy Med Name: Levothyroxine Sodium 50 MCG Oral Tablet] 90 tablet 3    Sig: TAKE 1 TABLET BY MOUTH  DAILY     Endocrinology:  Hypothyroid Agents Failed - 12/20/2020 10:48 PM      Failed - TSH needs to be rechecked within 3 months after an abnormal result. Refill until TSH is due.      Failed - TSH in normal range and within 360 days    TSH  Date Value Ref Range Status  12/09/2019 1.680 0.450 - 4.500 uIU/mL Final          Passed - Valid encounter within last 12 months    Recent Outpatient Visits           2 months ago MCI (mild cognitive impairment)   [facility] Maple Hudson., MD   6 months ago MCI (mild cognitive impairment)   [facility] Maple Hudson., MD   8 months ago MCI (mild cognitive impairment)   [facility] Maple Hudson., MD   9 months ago Essential (primary) hypertension   [facility] Maple Hudson., MD   1 year ago Essential (primary) hypertension   [facility] Maple Hudson., MD       Future Appointments             In 1 month Maple Hudson., MD [facility]

## 2021-02-09 ENCOUNTER — Other Ambulatory Visit: Payer: Self-pay

## 2021-02-09 ENCOUNTER — Ambulatory Visit: Payer: Medicare Other | Admitting: Family Medicine

## 2021-02-09 ENCOUNTER — Encounter: Payer: Self-pay | Admitting: Family Medicine

## 2021-02-09 VITALS — BP 134/73 | HR 60 | Temp 98.0°F | Wt 140.0 lb

## 2021-02-09 DIAGNOSIS — Z23 Encounter for immunization: Secondary | ICD-10-CM | POA: Diagnosis not present

## 2021-02-09 DIAGNOSIS — M4135 Thoracogenic scoliosis, thoracolumbar region: Secondary | ICD-10-CM

## 2021-02-09 DIAGNOSIS — E7800 Pure hypercholesterolemia, unspecified: Secondary | ICD-10-CM

## 2021-02-09 DIAGNOSIS — M8588 Other specified disorders of bone density and structure, other site: Secondary | ICD-10-CM

## 2021-02-09 DIAGNOSIS — I10 Essential (primary) hypertension: Secondary | ICD-10-CM

## 2021-02-09 DIAGNOSIS — E039 Hypothyroidism, unspecified: Secondary | ICD-10-CM

## 2021-02-09 DIAGNOSIS — G3184 Mild cognitive impairment, so stated: Secondary | ICD-10-CM | POA: Diagnosis not present

## 2021-02-09 HISTORY — DX: Mild cognitive impairment of uncertain or unknown etiology: G31.84

## 2021-02-09 NOTE — Progress Notes (Signed)
Established patient visit   Patient: Heidi Yoder   DOB: 06-25-1929   87 y.o. Female  MRN: [id number] Visit Date: 02/09/2021  Today's healthcare provider: Wilhemena Durie, MD   No chief complaint on file.  Subjective    HPI  MCI, Mild Cognitive Impairment-Patient is an 87 year old female who presents for follow up.  She was last seen on 10/07/2020.  At that time management changes were to discontinue her Oxybutynin. Consideration to be made for increasing Aricept.  Patient has had no falls and she has no specific complaints. Her son brings her to the office today but did not bring her to the room.  Her grandson has power of attorney.  MMSE today is 26/30      Medications: Outpatient Medications Prior to Visit  Medication Sig   CALCIUM-VITAMIN D PO Take 600 mg by mouth 2 (two) times daily.   Cyanocobalamin (VITAMIN B 12 PO) Take 1,000 mcg by mouth daily.   docusate sodium (COLACE) 100 MG capsule Take 200 mg by mouth daily.   donepezil (ARICEPT) 5 MG tablet TAKE 1 TABLET BY MOUTH EVERYDAY AT BEDTIME   hydrochlorothiazide (HYDRODIURIL) 25 MG tablet TAKE 1 TABLET BY MOUTH  DAILY   Lactobacillus (ACIDOPHILUS) 100 MG CAPS Take 100 mg by mouth daily.   levothyroxine (SYNTHROID) 50 MCG tablet TAKE 1 TABLET BY MOUTH  DAILY   Multiple Vitamins-Minerals (MULTIVITAMIN WOMEN PO) Take by mouth.   Multiple Vitamins-Minerals (PRESERVISION AREDS 2 PO) Take 2 capsules by mouth daily.   omeprazole (PRILOSEC) 20 MG capsule Take 20 mg by mouth daily.    simvastatin (ZOCOR) 20 MG tablet TAKE 1 TABLET BY MOUTH  DAILY   [DISCONTINUED] Multiple Vitamins-Minerals (PRESERVISION AREDS 2 PO) Take by mouth.   [DISCONTINUED] Multiple Vitamins-Minerals (PRESERVISION AREDS 2 PO) Take by mouth.   [DISCONTINUED] aspirin 81 MG tablet Take 81 mg by mouth daily.   [DISCONTINUED] cholecalciferol (VITAMIN D) 1000 UNITS tablet Take 1,000 Units by mouth daily.  (Patient not taking: Reported on  02/09/2021)   [DISCONTINUED] hyoscyamine (LEVSIN, ANASPAZ) 0.125 MG tablet Take 2 tablets (0.25 mg total) by mouth 2 (two) times daily.   [DISCONTINUED] oxybutynin (DITROPAN) 5 MG tablet TAKE 1 TABLET TWICE A DAY   [DISCONTINUED] ranitidine (ZANTAC) 150 MG capsule Take 1 capsule (150 mg total) by mouth 2 (two) times daily.   [DISCONTINUED] sucralfate (CARAFATE) 1 g tablet Take 1 tablet (1 g total) by mouth 4 (four) times daily -  with meals and at bedtime.   No facility-administered medications prior to visit.    Review of Systems  Respiratory:  Negative for shortness of breath.   Cardiovascular:  Negative for chest pain.  Psychiatric/Behavioral:  Negative for confusion.        Objective    BP 134/73 (BP Location: Right Arm, Patient Position: Sitting, Cuff Size: Normal)   Pulse 60   Temp 98 F (36.7 C) (Oral)   Wt 140 lb (63.5 kg)   SpO2 99%   BMI 27.34 kg/m  BP Readings from Last 3 Encounters:  02/09/21 134/73  10/07/20 138/75  06/17/20 127/76   Wt Readings from Last 3 Encounters:  02/09/21 140 lb (63.5 kg)  10/07/20 142 lb (64.4 kg)  06/17/20 148 lb (67.1 kg)      Physical Exam Vitals reviewed.  Constitutional:      Appearance: She is well-developed.  HENT:     Head: Normocephalic and atraumatic.     Right Ear:  External ear normal.     Left Ear: External ear normal.     Nose: Nose normal.  Eyes:     General: No scleral icterus.    Conjunctiva/sclera: Conjunctivae normal.     Comments: Right pupil dilated,irregular.  Cardiovascular:     Rate and Rhythm: Normal rate and regular rhythm.     Heart sounds: Normal heart sounds.  Pulmonary:     Effort: Pulmonary effort is normal.     Breath sounds: Normal breath sounds.  Abdominal:     Palpations: Abdomen is soft.  Musculoskeletal:     Comments: Significant thoracic kyphosis.  Skin:    General: Skin is warm and dry.  Neurological:     General: No focal deficit present.     Mental Status: She is alert and  oriented to person, place, and time.  Psychiatric:        Mood and Affect: Mood normal.        Behavior: Behavior normal.    MMSE 26/30  Results for orders placed or performed in visit on 02/09/21  CBC with Differential/Platelet  Result Value Ref Range   WBC 8.6 3.4 - 10.8 x10E3/uL   RBC 3.88 3.77 - 5.28 x10E6/uL   Hemoglobin 12.0 11.1 - 15.9 g/dL   Hematocrit 35.7 34.0 - 46.6 %   MCV 92 79 - 97 fL   MCH 30.9 26.6 - 33.0 pg   MCHC 33.6 31.5 - 35.7 g/dL   RDW 12.1 11.7 - 15.4 %   Platelets 285 150 - 450 x10E3/uL   Neutrophils 50 Not Estab. %   Lymphs 40 Not Estab. %   Monocytes 7 Not Estab. %   Eos 2 Not Estab. %   Basos 1 Not Estab. %   Neutrophils Absolute 4.4 1.4 - 7.0 x10E3/uL   Lymphocytes Absolute 3.5 (H) 0.7 - 3.1 x10E3/uL   Monocytes Absolute 0.6 0.1 - 0.9 x10E3/uL   EOS (ABSOLUTE) 0.2 0.0 - 0.4 x10E3/uL   Basophils Absolute 0.1 0.0 - 0.2 x10E3/uL   Immature Granulocytes 0 Not Estab. %   Immature Grans (Abs) 0.0 0.0 - 0.1 x10E3/uL  Comprehensive metabolic panel  Result Value Ref Range   Glucose 80 70 - 99 mg/dL   BUN 22 8 - 27 mg/dL   Creatinine, Ser 1.07 (H) 0.57 - 1.00 mg/dL   eGFR 50 (L) >59 mL/min/1.73   BUN/Creatinine Ratio 21 12 - 28   Sodium 140 134 - 144 mmol/L   Potassium 3.6 3.5 - 5.2 mmol/L   Chloride 99 96 - 106 mmol/L   CO2 27 20 - 29 mmol/L   Calcium 10.3 8.7 - 10.3 mg/dL   Total Protein 6.7 6.0 - 8.5 g/dL   Albumin 4.0 3.6 - 4.6 g/dL   Globulin, Total 2.7 1.5 - 4.5 g/dL   Albumin/Globulin Ratio 1.5 1.2 - 2.2   Bilirubin Total 0.4 0.0 - 1.2 mg/dL   Alkaline Phosphatase 68 44 - 121 IU/L   AST 19 0 - 40 IU/L   ALT 13 0 - 32 IU/L  Lipid Panel With LDL/HDL Ratio  Result Value Ref Range   Cholesterol, Total 196 100 - 199 mg/dL   Triglycerides 203 (H) 0 - 149 mg/dL   HDL 59 >39 mg/dL   VLDL Cholesterol Cal 35 5 - 40 mg/dL   LDL Chol Calc (NIH) 102 (H) 0 - 99 mg/dL   LDL/HDL Ratio 1.7 0.0 - 3.2 ratio  TSH  Result Value Ref Range   TSH 2.970  0.450 - 4.500 uIU/mL    Assessment & Plan     1. MCI (mild cognitive impairment) Niccoli stable.  Consider increasing Aricept to 10  2. Need for influenza vaccination  - Flu Vaccine QUAD High Dose(Fluad)  3. Essential (primary) hypertension Good control. - CBC with Differential/Platelet - Comprehensive metabolic panel - TSH  4. Hypothyroidism, unspecified type Follow-up TSH on Synthroid 50 - TSH  5. Hypercholesteremia Zocor 20 - Lipid Panel With LDL/HDL Ratio  6. Osteopenia of lumbar spine Last BMD 2017  7. Thoracogenic scoliosis of thoracolumbar region    No follow-ups on file.      I, Wilhemena Durie, MD, have reviewed all documentation for this visit. The documentation on 02/14/21 for the exam, diagnosis, procedures, and orders are all accurate and complete.    Hanalei Glace Cranford Mon, MD  [facility] [phone] (phone) [phone] (fax)  [place]

## 2021-02-10 ENCOUNTER — Telehealth: Payer: Self-pay

## 2021-02-10 LAB — COMPREHENSIVE METABOLIC PANEL
ALT: 13 IU/L (ref 0–32)
AST: 19 IU/L (ref 0–40)
Albumin/Globulin Ratio: 1.5 (ref 1.2–2.2)
Albumin: 4 g/dL (ref 3.6–4.6)
Alkaline Phosphatase: 68 IU/L (ref 44–121)
BUN/Creatinine Ratio: 21 (ref 12–28)
BUN: 22 mg/dL (ref 8–27)
Bilirubin Total: 0.4 mg/dL (ref 0.0–1.2)
CO2: 27 mmol/L (ref 20–29)
Calcium: 10.3 mg/dL (ref 8.7–10.3)
Chloride: 99 mmol/L (ref 96–106)
Creatinine, Ser: 1.07 mg/dL — ABNORMAL HIGH (ref 0.57–1.00)
Globulin, Total: 2.7 g/dL (ref 1.5–4.5)
Glucose: 80 mg/dL (ref 70–99)
Potassium: 3.6 mmol/L (ref 3.5–5.2)
Sodium: 140 mmol/L (ref 134–144)
Total Protein: 6.7 g/dL (ref 6.0–8.5)
eGFR: 50 mL/min/{1.73_m2} — ABNORMAL LOW (ref >59)

## 2021-02-10 LAB — CBC WITH DIFFERENTIAL/PLATELET
Basophils Absolute: 0.1 10*3/uL (ref 0.0–0.2)
Basos: 1 %
EOS (ABSOLUTE): 0.2 10*3/uL (ref 0.0–0.4)
Eos: 2 %
Hematocrit: 35.7 % (ref 34.0–46.6)
Hemoglobin: 12 g/dL (ref 11.1–15.9)
Immature Grans (Abs): 0 10*3/uL (ref 0.0–0.1)
Immature Granulocytes: 0 %
Lymphocytes Absolute: 3.5 10*3/uL — ABNORMAL HIGH (ref 0.7–3.1)
Lymphs: 40 %
MCH: 30.9 pg (ref 26.6–33.0)
MCHC: 33.6 g/dL (ref 31.5–35.7)
MCV: 92 fL (ref 79–97)
Monocytes Absolute: 0.6 10*3/uL (ref 0.1–0.9)
Monocytes: 7 %
Neutrophils Absolute: 4.4 10*3/uL (ref 1.4–7.0)
Neutrophils: 50 %
Platelets: 285 10*3/uL (ref 150–450)
RBC: 3.88 x10E6/uL (ref 3.77–5.28)
RDW: 12.1 % (ref 11.7–15.4)
WBC: 8.6 10*3/uL (ref 3.4–10.8)

## 2021-02-10 LAB — LIPID PANEL WITH LDL/HDL RATIO
Cholesterol, Total: 196 mg/dL (ref 100–199)
HDL: 59 mg/dL (ref >39)
LDL Chol Calc (NIH): 102 mg/dL — ABNORMAL HIGH (ref 0–99)
LDL/HDL Ratio: 1.7 ratio (ref 0.0–3.2)
Triglycerides: 203 mg/dL — ABNORMAL HIGH (ref 0–149)
VLDL Cholesterol Cal: 35 mg/dL (ref 5–40)

## 2021-02-10 LAB — TSH: TSH: 2.97 u[IU]/mL (ref 0.450–4.500)

## 2021-02-10 NOTE — Telephone Encounter (Signed)
Copied from CRM [id number]. Topic: General - Other >> Feb 10, 2021  3:39 PM Traci Sermon wrote: Reason for CRM: Pts husband called in stating they left the pt medication list at the office to be updated, and requested if someone could give them a call when its ready to be picked up. As well as pt now wants all medications sent [facility] on [facility], and no longer sent to [facility] pharmacy, please advise.

## 2021-02-19 ENCOUNTER — Telehealth: Payer: Self-pay

## 2021-02-19 NOTE — Telephone Encounter (Signed)
Copied from CRM [id number]. Topic: General - Inquiry >> Feb 18, 2021  1:42 PM Daphine Deutscher D wrote:  Pt' s son called saying his mom has a bottle of Ditrotan 5 mg and they d not know whether she is suppose to be taking this prescription or not.  CB#  [phone]

## 2021-02-19 NOTE — Telephone Encounter (Signed)
Returned call to pt's son Molly Maduro. Advised medication mentioned below is not in her med list. Advised to hold off on medication until they discuss it with provider.

## 2021-03-26 ENCOUNTER — Telehealth: Payer: Self-pay

## 2021-03-26 NOTE — Telephone Encounter (Signed)
Copied from CRM [id number]. Topic: Appointment Scheduling - Scheduling Inquiry for Clinic >> Mar 26, 2021 11:43 AM Pawlus, Maxine Glenn A wrote: Reason for CRM: Pt wanted to schedule an office visit for next week if possible, please advise if the pt can be worked in with a different provider.

## 2021-03-26 NOTE — Telephone Encounter (Signed)
Returned call to patient. No answer and no vm. Okay for pec to schedule an appt on Dr. Madelaine Etienne schedule on Tuesday 03/30/2021. Thanks.

## 2021-03-28 ENCOUNTER — Other Ambulatory Visit: Payer: Self-pay | Admitting: Family Medicine

## 2021-03-28 DIAGNOSIS — G3184 Mild cognitive impairment, so stated: Secondary | ICD-10-CM

## 2021-03-29 NOTE — Telephone Encounter (Signed)
Requested Prescriptions  Pending Prescriptions Disp Refills  . donepezil (ARICEPT) 5 MG tablet [Pharmacy Med Name: DONEPEZIL HCL 5 MG TABLET] 30 tablet 4    Sig: TAKE 1 TABLET BY MOUTH EVERYDAY AT BEDTIME     Neurology:  Alzheimer's Agents Passed - 03/28/2021  1:19 PM      Passed - Valid encounter within last 6 months    Recent Outpatient Visits          1 month ago MCI (mild cognitive impairment)   [facility] Maple Hudson., MD   5 months ago MCI (mild cognitive impairment)   [facility] Maple Hudson., MD   9 months ago MCI (mild cognitive impairment)   [facility] Maple Hudson., MD   11 months ago MCI (mild cognitive impairment)   [facility] Maple Hudson., MD   1 year ago Essential (primary) hypertension   [facility] Maple Hudson., MD      Future Appointments            In 4 months Maple Hudson., MD [facility], [facility]

## 2021-04-20 ENCOUNTER — Telehealth: Payer: Self-pay | Admitting: Family Medicine

## 2021-04-20 NOTE — Telephone Encounter (Signed)
Medication can be purchased OTC if needed. Thanks EP

## 2021-04-20 NOTE — Telephone Encounter (Signed)
Medication is listed as historical in chart, medication last prescribed 12/29/16 and d/c for change in therapy and patient was started on Zantac 150mg . On 02/10/21 Zantac was d/c. Please advise. KW

## 2021-04-20 NOTE — Telephone Encounter (Signed)
[facility] refill request for the following medications:   omeprazole (PRILOSEC) 20 MG capsule  Please advise.  

## 2021-04-28 ENCOUNTER — Ambulatory Visit: Payer: Medicare Other | Admitting: Family Medicine

## 2021-04-30 ENCOUNTER — Other Ambulatory Visit: Payer: Self-pay

## 2021-04-30 ENCOUNTER — Ambulatory Visit: Payer: Medicare Other | Admitting: Physician Assistant

## 2021-04-30 ENCOUNTER — Encounter: Payer: Self-pay | Admitting: Physician Assistant

## 2021-04-30 VITALS — BP 144/71 | HR 66 | Wt 142.0 lb

## 2021-04-30 DIAGNOSIS — I10 Essential (primary) hypertension: Secondary | ICD-10-CM

## 2021-04-30 DIAGNOSIS — K219 Gastro-esophageal reflux disease without esophagitis: Secondary | ICD-10-CM | POA: Diagnosis not present

## 2021-04-30 DIAGNOSIS — G3184 Mild cognitive impairment, so stated: Secondary | ICD-10-CM

## 2021-04-30 MED ORDER — OMEPRAZOLE 20 MG PO CPDR: 20.0000 mg | DELAYED_RELEASE_CAPSULE | Freq: Every day | ORAL | 1 refills | Status: DC

## 2021-04-30 MED ORDER — HYDROCHLOROTHIAZIDE 25 MG PO TABS: 25.0000 mg | ORAL_TABLET | Freq: Every day | ORAL | 3 refills | Status: DC

## 2021-04-30 NOTE — Assessment & Plan Note (Signed)
Okay with 144/71, will continue to monitor

## 2021-04-30 NOTE — Assessment & Plan Note (Signed)
Asymptomatic continue omeprazole

## 2021-04-30 NOTE — Assessment & Plan Note (Signed)
I did notice some repetitive questioning in the office, it did seem she had a more difficult time keeping track of the conversation. When directly spoken to, no problems with conversation.  Reassured son there is nothing I would change as of now, but if memory changes start to affect mood or behavior to let us know. F/u 2 mo, will do full MSE at that time.

## 2021-04-30 NOTE — Progress Notes (Signed)
Established patient visit   Patient: Heidi Yoder   DOB: Apr 03, 1930   88 y.o. Female  MRN: [id number] Visit Date: 04/30/2021  Today's healthcare provider: Mikey Kirschner, PA-C  Cc. Med refill, memory questions  Subjective    HPI  GERD, Follow up:  The patient was last seen for GERD 6 months ago. Changes made since that visit include none.  Patient at that time was asymptomatic.  She was on PPI and sucrafate.  However on her October visit she was not taking the Sucrafate and it was discontinued from her list.  Patient's son is with her today and states that the patient needed to get her Omeprazole refilled.  She was getting it from another provider and wanted [place] to start refilling it for her.  They states she is doing well on the Omeprazole alone.  She is accompanied by her son at today's visit.  Memory changes Her son has noticed some changes--asking the same questions repeatedly, forgetting things short term. He recounts one story when she ordered something at a restaurant and when it was sent to the table, she didn't remember ordering it. Denies forgetting where she is, getting lost, forgetting things on stove, etc.  Pt herself does not find she asks questions too often, denies being concerned over memory.  ---------------------------------------------------------------------------------------   Medications: Outpatient Medications Prior to Visit  Medication Sig   CALCIUM-VITAMIN D PO Take 600 mg by mouth 2 (two) times daily.   Cyanocobalamin (VITAMIN B 12 PO) Take 1,000 mcg by mouth daily.   docusate sodium (COLACE) 100 MG capsule Take 200 mg by mouth daily.   donepezil (ARICEPT) 5 MG tablet TAKE 1 TABLET BY MOUTH EVERYDAY AT BEDTIME   Lactobacillus (ACIDOPHILUS) 100 MG CAPS Take 100 mg by mouth daily.   levothyroxine (SYNTHROID) 50 MCG tablet TAKE 1 TABLET BY MOUTH  DAILY   Multiple Vitamins-Minerals (MULTIVITAMIN WOMEN PO) Take by mouth.   Multiple  Vitamins-Minerals (PRESERVISION AREDS 2 PO) Take 2 capsules by mouth daily.   simvastatin (ZOCOR) 20 MG tablet TAKE 1 TABLET BY MOUTH  DAILY   [DISCONTINUED] hydrochlorothiazide (HYDRODIURIL) 25 MG tablet TAKE 1 TABLET BY MOUTH  DAILY   [DISCONTINUED] omeprazole (PRILOSEC) 20 MG capsule Take 20 mg by mouth daily.    No facility-administered medications prior to visit.    Review of Systems  Gastrointestinal:  Negative for abdominal distention, abdominal pain, anal bleeding, blood in stool, constipation, diarrhea, nausea, rectal pain and vomiting.      Objective    BP (!) 144/71    Pulse 66    Wt 142 lb (64.4 kg)    SpO2 96%    BMI 27.73 kg/m   Vitals:   04/30/21 1034 04/30/21 1126  BP: (!) 156/76 (!) 144/71  Pulse: 66   SpO2: 96%   Weight: 142 lb (64.4 kg)      Physical Exam Constitutional:      General: She is awake.     Appearance: She is well-developed.  HENT:     Head: Normocephalic.  Eyes:     Conjunctiva/sclera: Conjunctivae normal.  Cardiovascular:     Rate and Rhythm: Normal rate and regular rhythm.     Heart sounds: Normal heart sounds.  Pulmonary:     Effort: Pulmonary effort is normal.     Breath sounds: Normal breath sounds.  Skin:    General: Skin is warm.  Neurological:     Mental Status: She is alert and oriented to  person, place, and time.  Psychiatric:        Attention and Perception: Attention normal.        Mood and Affect: Mood normal.        Speech: Speech normal.        Behavior: Behavior is cooperative.     No results found for any visits on 04/30/21.  Assessment & Plan     Problem List Items Addressed This Visit       Cardiovascular and Mediastinum   Essential (primary) hypertension    Okay with 144/71, will continue to monitor      Relevant Medications   hydrochlorothiazide (HYDRODIURIL) 25 MG tablet     Digestive   GERD (gastroesophageal reflux disease) - Primary    Asymptomatic continue omeprazole      Relevant  Medications   omeprazole (PRILOSEC) 20 MG capsule     Other   MCI (mild cognitive impairment)    I did notice some repetitive questioning in the office, it did seem she had a more difficult time keeping track of the conversation. When directly spoken to, no problems with conversation.  Reassured son there is nothing I would change as of now, but if memory changes start to affect mood or behavior to let us know. F/u 2 mo, will do full MSE at that time.         Return in about 2 months (around 06/28/2021) for memory changes.      I, Mikey Kirschner, PA-C have reviewed all documentation for this visit. The documentation on  04/30/2021  for the exam, diagnosis, procedures, and orders are all accurate and complete.    Mikey Kirschner, PA-C  [facility] [phone] (phone) [phone] (fax)  [place]

## 2021-05-17 ENCOUNTER — Other Ambulatory Visit: Payer: Self-pay | Admitting: Family Medicine

## 2021-07-05 ENCOUNTER — Telehealth: Payer: Self-pay

## 2021-07-05 ENCOUNTER — Encounter: Payer: Self-pay | Admitting: Family Medicine

## 2021-07-05 NOTE — Telephone Encounter (Signed)
I told her she could come in the room as long as Heidi Yoder said it was ok.  The family says that Heidi Yoder, the son, is bullying Heidi Yoder and trying to get her to let him move in with her and she does not want that but is scared to tell him no because she is scared of him and he threatens to not take care of her medications for her.    Supposedly Heidi Yoder is going to try to get Dr. Sullivan Lone to say patient is demented and cant take care of herself.  The healthcare POA is not able to come tomorrow to the visit due to him being a Marine scientist but POA's wife Heidi Yoder is coming.   The family is just worried that Heidi Yoder will force something on [name] without authority.

## 2021-07-05 NOTE — Telephone Encounter (Signed)
POA and Healthcare POA, Nolon Bussing Brogan's wife, Morrie Sheldon, called to see if she could come into the exam room with Roddie Mc tomorrow 07/06/21 for her visit along with Nelva Bush, Marlene's son.

## 2021-07-05 NOTE — Progress Notes (Unsigned)
°  ° ° °  Established patient visit   Patient: Heidi Yoder   DOB: 02-22-1930   88 y.o. Female  MRN: [id number] Visit Date: 07/06/2021  Today's healthcare provider: Megan Mans, MD   No chief complaint on file.  Subjective    HPI  ***  Medications: Outpatient Medications Prior to Visit  Medication Sig   CALCIUM-VITAMIN D PO Take 600 mg by mouth 2 (two) times daily.   Cyanocobalamin (VITAMIN B 12 PO) Take 1,000 mcg by mouth daily.   docusate sodium (COLACE) 100 MG capsule Take 200 mg by mouth daily.   donepezil (ARICEPT) 5 MG tablet TAKE 1 TABLET BY MOUTH EVERYDAY AT BEDTIME   hydrochlorothiazide (HYDRODIURIL) 25 MG tablet Take 1 tablet (25 mg total) by mouth daily.   Lactobacillus (ACIDOPHILUS) 100 MG CAPS Take 100 mg by mouth daily.   levothyroxine (SYNTHROID) 50 MCG tablet TAKE 1 TABLET BY MOUTH  DAILY   Multiple Vitamins-Minerals (MULTIVITAMIN WOMEN PO) Take by mouth.   Multiple Vitamins-Minerals (PRESERVISION AREDS 2 PO) Take 2 capsules by mouth daily.   omeprazole (PRILOSEC) 20 MG capsule Take 1 capsule (20 mg total) by mouth daily.   simvastatin (ZOCOR) 20 MG tablet TAKE 1 TABLET BY MOUTH EVERY DAY   No facility-administered medications prior to visit.    Review of Systems  Constitutional:  Negative for appetite change, chills, fatigue and fever.  Respiratory:  Negative for chest tightness and shortness of breath.   Cardiovascular:  Negative for chest pain and palpitations.  Gastrointestinal:  Negative for abdominal pain, nausea and vomiting.  Neurological:  Negative for dizziness and weakness.   {Labs   Heme   Chem   Endocrine   Serology   Results Review (optional):23779}   Objective    There were no vitals taken for this visit. {Show previous vital signs (optional):23777}  Physical Exam  ***  No results found for any visits on 07/06/21.  Assessment & Plan     ***  No follow-ups on file.      {provider  attestation***:1}   Megan Mans, MD  [facility] [phone] (phone) [phone] (fax)  [facility]

## 2021-07-06 ENCOUNTER — Encounter: Payer: Self-pay | Admitting: Family Medicine

## 2021-07-06 ENCOUNTER — Ambulatory Visit: Payer: Medicare Other | Admitting: Family Medicine

## 2021-07-06 ENCOUNTER — Other Ambulatory Visit: Payer: Self-pay

## 2021-07-06 VITALS — BP 132/79 | HR 66 | Temp 98.8°F | Resp 16 | Ht 60.0 in | Wt 141.0 lb

## 2021-07-06 DIAGNOSIS — G629 Polyneuropathy, unspecified: Secondary | ICD-10-CM

## 2021-07-06 DIAGNOSIS — M4135 Thoracogenic scoliosis, thoracolumbar region: Secondary | ICD-10-CM | POA: Diagnosis not present

## 2021-07-06 DIAGNOSIS — E7800 Pure hypercholesterolemia, unspecified: Secondary | ICD-10-CM

## 2021-07-06 DIAGNOSIS — E039 Hypothyroidism, unspecified: Secondary | ICD-10-CM | POA: Diagnosis not present

## 2021-07-06 DIAGNOSIS — G8929 Other chronic pain: Secondary | ICD-10-CM

## 2021-07-06 DIAGNOSIS — I10 Essential (primary) hypertension: Secondary | ICD-10-CM | POA: Diagnosis not present

## 2021-07-06 DIAGNOSIS — G3184 Mild cognitive impairment, so stated: Secondary | ICD-10-CM | POA: Diagnosis not present

## 2021-07-06 DIAGNOSIS — E559 Vitamin D deficiency, unspecified: Secondary | ICD-10-CM

## 2021-07-06 DIAGNOSIS — K219 Gastro-esophageal reflux disease without esophagitis: Secondary | ICD-10-CM

## 2021-07-06 DIAGNOSIS — M545 Low back pain, unspecified: Secondary | ICD-10-CM

## 2021-07-06 MED ORDER — DONEPEZIL HCL 10 MG PO TABS: 10.0000 mg | ORAL_TABLET | Freq: Every day | ORAL | 5 refills | Status: DC

## 2021-07-13 ENCOUNTER — Telehealth: Payer: Self-pay

## 2021-07-13 NOTE — Telephone Encounter (Signed)
Copied from CRM [id number]. Topic: General - Other ?>> Jul 13, 2021 11:37 AM Herby Abraham C wrote: ?Reason for CRM: pt's son called in to speak with provider or assistant. Pt had a ov on 3/14. Son says that pt didn't give a lot of details about her appt. Son would like a call back to ask a few questions.  ? ? ?CB: [phone]) [phone] ?

## 2021-07-13 NOTE — Telephone Encounter (Signed)
Please advise 

## 2021-07-14 NOTE — Telephone Encounter (Signed)
Pts son was calling to follow up on previous messages.  ?

## 2021-07-15 NOTE — Telephone Encounter (Signed)
Patient's son Molly Maduro was advised patient's insurance will most likely not cover depends. Molly Maduro had several questions concerning patient's last office visit. ?

## 2021-07-15 NOTE — Telephone Encounter (Signed)
Patient son Molly Maduro called in asking to speak to Dr Wonda Olds nurse have some concerns he would like to discuss. And needing to know if patient insurance will cover Depends and other issues. Can be reached at  Ph# [phone] ?

## 2021-07-19 ENCOUNTER — Ambulatory Visit (INDEPENDENT_AMBULATORY_CARE_PROVIDER_SITE_OTHER): Payer: Medicare Other

## 2021-07-19 VITALS — Wt 141.0 lb

## 2021-07-19 DIAGNOSIS — Z0000 Encounter for general adult medical examination without abnormal findings: Secondary | ICD-10-CM

## 2021-07-19 NOTE — Progress Notes (Signed)
Virtual Visit via Telephone Note  I connected with  Aida RaiderRebecca D Willits on 07/19/21 at  9:30 AM EDT by telephone and verified that I am speaking with the correct person using two identifiers.  Location: Patient: home Provider: [facility] Persons participating in the virtual visit: patient/Nurse Health Advisor   I discussed the limitations, risks, security and privacy concerns of performing an evaluation and management service by telephone and the availability of in person appointments. The patient expressed understanding and agreed to proceed.  Interactive audio and video telecommunications were attempted between this nurse and patient, however failed, due to patient having technical difficulties OR patient did not have access to video capability.  We continued and completed visit with audio only.  Some vital signs may be absent or patient reported.   Hal HopeLorrie S Quinnie Barcelo, LPN  Subjective:   Rogelio SeenRebecca D Depner is a 88 y.o. female who presents for Medicare Annual (Subsequent) preventive examination.  Review of Systems     Cardiac Risk Factors include: dyslipidemia;hypertension     Objective:    Today's Vitals   07/19/21 0928  Weight: 141 lb (64 kg)   Body mass index is 27.54 kg/m.     07/19/2021    9:35 AM 02/03/2020   10:26 AM 01/22/2019   10:27 AM 01/16/2018   10:23 AM 12/29/2016    9:41 AM 09/29/2015    3:15 PM 04/03/2015    4:05 PM  Advanced Directives  Does Patient Have a Medical Advance Directive? Yes No Yes Yes Yes Yes Yes  Type of Advance Directive Living will;Healthcare Power of [employer] Grou[employer]torney  Healthcare Power of [place];Living will Living will Living will Healthcare Power of [place];Living will Living will  Does patient want to make changes to medical advance directive? Yes (Inpatient - patient defers changing a medical advance directive and declines information at this time)        Copy of Healthcare Power of Attorney in Chart? Yes - validated most recent copy scanned in chart (See  row information)  No - copy requested   No - copy requested   Would patient like information on creating a medical advance directive?  No - Patient declined         Current Medications (verified) Outpatient Encounter Medications as of 07/19/2021  Medication Sig   CALCIUM-VITAMIN D PO Take 600 mg by mouth 2 (two) times daily.   Cyanocobalamin (VITAMIN B 12 PO) Take 1,000 mcg by mouth daily.   docusate sodium (COLACE) 100 MG capsule Take 200 mg by mouth daily.   donepezil (ARICEPT) 10 MG tablet Take 1 tablet (10 mg total) by mouth at bedtime.   hydrochlorothiazide (HYDRODIURIL) 25 MG tablet Take 1 tablet (25 mg total) by mouth daily.   Lactobacillus (ACIDOPHILUS) 100 MG CAPS Take 100 mg by mouth daily.   levothyroxine (SYNTHROID) 50 MCG tablet TAKE 1 TABLET BY MOUTH  DAILY   Multiple Vitamins-Minerals (MULTIVITAMIN WOMEN PO) Take by mouth.   Multiple Vitamins-Minerals (PRESERVISION AREDS 2 PO) Take 2 capsules by mouth daily.   omeprazole (PRILOSEC) 20 MG capsule Take 1 capsule (20 mg total) by mouth daily.   simvastatin (ZOCOR) 20 MG tablet TAKE 1 TABLET BY MOUTH EVERY DAY   No facility-administered encounter medications on file as of 07/19/2021.    Allergies (verified) Patient has no known allergies.   History: Past Medical History:  Diagnosis Date   GERD (gastroesophageal reflux disease)    Hypertension    Hypothyroidism    MCI (mild cognitive impairment) 02/09/2021  Peripheral neuropathy    Past Surgical History:  Procedure Laterality Date   ABDOMINAL HYSTERECTOMY     due to cervical hyperlasia   BACK SURGERY     spinal stenosis and back surgery   CATARACT EXTRACTION Bilateral    HIP PINNING,CANNULATED Left 04/04/2015   Procedure: CANNULATED HIP PINNING;  Surgeon: Deeann Saint, MD;  Location: [facility] ORS;  Service: Orthopedics;  Laterality: Left;   UPPER GI ENDOSCOPY  10/17/03   normal duodenum, normal esophagus, normal stomach   Family History  Problem Relation Age of  Onset   Ovarian cancer Mother    Hypertension Sister    Colon polyps Sister    Cancer Sister        breast   Heart disease Brother    Colon polyps Brother    Dementia Brother    CVA Father    Social History   Socioeconomic History   Marital status: Widowed    Spouse name: Not on file   Number of children: 2   Years of education: Not on file   Highest education level: High school graduate  Occupational History   Not on file  Tobacco Use   Smoking status: Never   Smokeless tobacco: Never  Vaping Use   Vaping Use: Never used  Substance and Sexual Activity   Alcohol use: No   Drug use: No   Sexual activity: Not on file  Other Topics Concern   Not on file  Social History Narrative   Not on file   Social Determinants of Health   Financial Resource Strain: Low Risk    Difficulty of Paying Living Expenses: Not hard at all  Food Insecurity: No Food Insecurity   Worried About Programme researcher, broadcasting/film/video in the Last Year: Never true   Ran Out of Food in the Last Year: Never true  Transportation Needs: No Transportation Needs   Lack of Transportation (Medical): No   Lack of Transportation (Non-Medical): No  Physical Activity: Insufficiently Active   Days of Exercise per Week: 2 days   Minutes of Exercise per Session: 20 min  Stress: No Stress Concern Present   Feeling of Stress : Not at all  Social Connections: Moderately Isolated   Frequency of Communication with Friends and Family: More than three times a week   Frequency of Social Gatherings with Friends and Family: Once a week   Attends Religious Services: More than 4 times per year   Active Member of [employer] or Organizations: No   Attends Banker Meetings: Never   Marital Status: Widowed    Tobacco Counseling Counseling given: Not Answered   Clinical Intake:  Pre-visit preparation completed: Yes  Pain : No/denies pain     Nutritional Risks: None Diabetes: No  How often do you need to have someone  help you when you read instructions, pamphlets, or other written materials from your doctor or pharmacy?: 1 - Never  Diabetic?no  Interpreter Needed?: No  Information entered by :: Kennedy Bucker, LPN   Activities of Daily Living    07/19/2021    9:38 AM 07/18/2021   12:48 PM  In your present state of health, do you have any difficulty performing the following activities:  Hearing? 1 0  Vision? 0 0  Difficulty concentrating or making decisions? 1 1  Walking or climbing stairs? 1 0  Dressing or bathing? 0 0  Doing errands, shopping? 1 1  Preparing Food and eating ? N N  Using the Toilet? N  N  In the past six months, have you accidently leaked urine? N Y  Do you have problems with loss of bowel control? N N  Managing your Medications? N Y  Managing your Finances? N Y  Housekeeping or managing your Housekeeping? N N    Patient Care Team: Maple Hudson., MD as PCP - General (Family Medicine)  Indicate any recent Medical Services you may have received from other than [facility] providers in the past year (date may be approximate).     Assessment:   This is a routine wellness examination for Casha.  Hearing/Vision screen Hearing Screening - Comments:: No aids  Vision Screening - Comments:: Readers-   Dietary issues and exercise activities discussed: Current Exercise Habits: Home exercise routine, Time (Minutes): 20, Frequency (Times/Week): 2, Weekly Exercise (Minutes/Week): 40, Intensity: Mild   Goals Addressed             This Visit's Progress    DIET - EAT MORE FRUITS AND VEGETABLES         Depression Screen    07/19/2021    9:33 AM 07/06/2021   10:42 AM 04/30/2021   10:27 AM 04/07/2020    3:48 PM 02/03/2020   10:22 AM 01/22/2019   10:28 AM 01/16/2018   10:27 AM  PHQ 2/9 Scores  PHQ - 2 Score 0 0 0 0 0 0 0  PHQ- 9 Score 1 3 1    1     Fall Risk    07/19/2021    9:38 AM 07/18/2021   12:48 PM 07/06/2021   10:42 AM 04/30/2021   10:26 AM 03/05/2020    1:51  PM  Fall Risk   Falls in the past year? 0 0 0 0 0  Number falls in past yr: 0 0 0 0 0  Injury with Fall? 0 0 0 0 0  Risk for fall due to : No Fall Risks  Impaired balance/gait;Impaired mobility    Follow up Falls evaluation completed  Falls evaluation completed  Falls evaluation completed    FALL RISK PREVENTION PERTAINING TO THE HOME:  Any stairs in or around the home? Yes  If so, are there any without handrails? No  Home free of loose throw rugs in walkways, pet beds, electrical cords, etc? Yes  Adequate lighting in your home to reduce risk of falls? Yes   ASSISTIVE DEVICES UTILIZED TO PREVENT FALLS:  Life alert? Yes  Use of a cane, walker or w/c? Yes  Grab bars in the bathroom? Yes  Shower chair or bench in shower? Yes  Elevated toilet seat or a handicapped toilet? No   Cognitive Function:    07/06/2021   11:09 AM 02/09/2021    1:49 PM 06/17/2020    4:21 PM 04/07/2020    3:59 PM 08/07/2019    3:55 PM  MMSE - Mini Mental State Exam  Orientation to time 2 5 4 3 5   Orientation to Place 5 5 5 5 4   Registration 3 3 3 3 3   Attention/ Calculation 4 5 5 5 5   Recall 3 0 3 2 3   Language- name 2 objects 2 2 2 2 2   Language- repeat 1 1 1  0 1  Language- follow 3 step command 3 3 3 3 3   Language- read & follow direction 1 1 1 1 1   Write a sentence 1 0 1 1 1   Copy design 1 1 1 1 1   Total score 26 26 29 26  29  04/07/2020    3:48 PM 02/03/2020   10:32 AM 01/22/2019   10:36 AM 01/16/2018   10:28 AM 12/29/2016    9:44 AM  6CIT Screen  What Year? 4 points 0 points 0 points 0 points 0 points  What month? 0 points 0 points 0 points 0 points 0 points  What time? 0 points 0 points 0 points 0 points 0 points  Count back from 20 0 points 0 points 0 points 0 points 0 points  Months in reverse 4 points 0 points 0 points 0 points 0 points  Repeat phrase 8 points 10 points 6 points 4 points 0 points  Total Score 16 points 10 points 6 points 4 points 0 points     Immunizations Immunization History  Administered Date(s) Administered   Fluad Quad(high Dose 65+) 02/06/2019, 03/05/2020, 02/09/2021   Influenza, High Dose Seasonal PF 02/09/2015, 12/29/2016, 01/16/2018   PFIZER(Purple Top)SARS-COV-2 Vaccination 03/05/2020   Pneumococcal Conjugate-13 08/07/2013   Pneumococcal Polysaccharide-23 05/03/2000   Td 12/18/2003    TDAP status: Due, Education has been provided regarding the importance of this vaccine. Advised may receive this vaccine at local pharmacy or Health Dept. Aware to provide a copy of the vaccination record if obtained from local pharmacy or Health Dept. Verbalized acceptance and understanding.  Flu Vaccine status: Up to date  Pneumococcal vaccine status: Up to date  Covid-19 vaccine status: Completed vaccines  Qualifies for Shingles Vaccine? Yes   Zostavax completed No   Shingrix Completed?: No.    Education has been provided regarding the importance of this vaccine. Patient has been advised to call insurance company to determine out of pocket expense if they have not yet received this vaccine. Advised may also receive vaccine at local pharmacy or Health Dept. Verbalized acceptance and understanding.  Screening Tests Health Maintenance  Topic Date Due   Zoster Vaccines- Shingrix (1 of 2) Never done   TETANUS/TDAP  12/17/2013   MAMMOGRAM  03/11/2020   COVID-19 Vaccine (2 - Pfizer risk series) 03/26/2020   DEXA SCAN  08/24/2020   Pneumonia Vaccine 50+ Years old  Completed   INFLUENZA VACCINE  Completed   HPV VACCINES  Aged Out    Health Maintenance  Health Maintenance Due  Topic Date Due   Zoster Vaccines- Shingrix (1 of 2) Never done   TETANUS/TDAP  12/17/2013   MAMMOGRAM  03/11/2020   COVID-19 Vaccine (2 - Pfizer risk series) 03/26/2020   DEXA SCAN  08/24/2020    Colorectal cancer screening: No longer required.   Mammogram status: No longer required due to age.  Lung Cancer Screening: (Low Dose CT Chest  recommended if Age 52-80 years, 30 pack-year currently smoking OR have quit w/in 15years.) does not qualify.    Additional Screening:  Hepatitis C Screening: does not qualify; Completed no  Vision Screening: Recommended annual ophthalmology exams for early detection of glaucoma and other disorders of the eye. Is the patient up to date with their annual eye exam?  Yes  Who is the provider or what is the name of the office in which the patient attends annual eye exams? [facility] If pt is not established with a provider, would they like to be referred to a provider to establish care? No .   Dental Screening: Recommended annual dental exams for proper oral hygiene  Community Resource Referral / Chronic Care Management: CRR required this visit?  No   CCM required this visit?  No      Plan:  I have personally reviewed and noted the following in the patient's chart:   Medical and social history Use of alcohol, tobacco or illicit drugs  Current medications and supplements including opioid prescriptions.  Functional ability and status Nutritional status Physical activity Advanced directives List of other physicians Hospitalizations, surgeries, and ER visits in previous 12 months Vitals Screenings to include cognitive, depression, and falls Referrals and appointments  In addition, I have reviewed and discussed with patient certain preventive protocols, quality metrics, and best practice recommendations. A written personalized care plan for preventive services as well as general preventive health recommendations were provided to patient.     Hal Hope, LPN   01/12/1006   Nurse Notes:

## 2021-07-19 NOTE — Patient Instructions (Addendum)
Ms. Gheen , ?Thank you for taking time to come for your Medicare Wellness Visit. I appreciate your ongoing commitment to your health goals. Please review the following plan we discussed and let me know if I can assist you in the future.  ? ?Screening recommendations/referrals: ?Colonoscopy: aged out ?Mammogram: aged out ?Bone Density: aged out ?Recommended yearly ophthalmology/optometry visit for glaucoma screening and checkup ?Recommended yearly dental visit for hygiene and checkup ? ?Vaccinations: ?Influenza vaccine: 02/09/21 ?Pneumococcal vaccine: 08/07/13 ?Tdap vaccine: 12/18/03, due ?Shingles vaccine: n/d   ?Covid-19:03/05/20 ? ?Advanced directives: yes ? ?Conditions/risks identified: none ? ?Next appointment: Follow up in one year for your annual wellness visit - 07/21/22 @ 9am by phone ? ? ?Preventive Care 36 Years and Older, Female ?Preventive care refers to lifestyle choices and visits with your health care provider that can promote health and wellness. ?What does preventive care include? ?A yearly physical exam. This is also called an annual well check. ?Dental exams once or twice a year. ?Routine eye exams. Ask your health care provider how often you should have your eyes checked. ?Personal lifestyle choices, including: ?Daily care of your teeth and gums. ?Regular physical activity. ?Eating a healthy diet. ?Avoiding tobacco and drug use. ?Limiting alcohol use. ?Practicing safe sex. ?Taking low-dose aspirin every day. ?Taking vitamin and mineral supplements as recommended by your health care provider. ?What happens during an annual well check? ?The services and screenings done by your health care provider during your annual well check will depend on your age, overall health, lifestyle risk factors, and family history of disease. ?Counseling  ?Your health care provider may ask you questions about your: ?Alcohol use. ?Tobacco use. ?Drug use. ?Emotional well-being. ?Home and relationship well-being. ?Sexual  activity. ?Eating habits. ?History of falls. ?Memory and ability to understand (cognition). ?Work and work Astronomer. ?Reproductive health. ?Screening  ?You may have the following tests or measurements: ?Height, weight, and BMI. ?Blood pressure. ?Lipid and cholesterol levels. These may be checked every 5 years, or more frequently if you are over 45 years old. ?Skin check. ?Lung cancer screening. You may have this screening every year starting at age 79 if you have a 30-pack-year history of smoking and currently smoke or have quit within the past 15 years. ?Fecal occult blood test (FOBT) of the stool. You may have this test every year starting at age 33. ?Flexible sigmoidoscopy or colonoscopy. You may have a sigmoidoscopy every 5 years or a colonoscopy every 10 years starting at age 69. ?Hepatitis C blood test. ?Hepatitis B blood test. ?Sexually transmitted disease (STD) testing. ?Diabetes screening. This is done by checking your blood sugar (glucose) after you have not eaten for a while (fasting). You may have this done every 1-3 years. ?Bone density scan. This is done to screen for osteoporosis. You may have this done starting at age 42. ?Mammogram. This may be done every 1-2 years. Talk to your health care provider about how often you should have regular mammograms. ?Talk with your health care provider about your test results, treatment options, and if necessary, the need for more tests. ?Vaccines  ?Your health care provider may recommend certain vaccines, such as: ?Influenza vaccine. This is recommended every year. ?Tetanus, diphtheria, and acellular pertussis (Tdap, Td) vaccine. You may need a Td booster every 10 years. ?Zoster vaccine. You may need this after age 7. ?Pneumococcal 13-valent conjugate (PCV13) vaccine. One dose is recommended after age 9. ?Pneumococcal polysaccharide (PPSV23) vaccine. One dose is recommended after age 15. ?Talk to your  health care provider about which screenings and vaccines  you need and how often you need them. ?This information is not intended to replace advice given to you by your health care provider. Make sure you discuss any questions you have with your health care provider. ?Document Released: 05/08/2015 Document Revised: 12/30/2015 Document Reviewed: 02/10/2015 ?Elsevier Interactive Patient Education ? 2017 [place]. ? ?Fall Prevention in the Home ?Falls can cause injuries. They can happen to people of all ages. There are many things you can do to make your home safe and to help prevent falls. ?What can I do on the outside of my home? ?Regularly fix the edges of walkways and driveways and fix any cracks. ?Remove anything that might make you trip as you walk through a door, such as a raised step or threshold. ?Trim any bushes or trees on the path to your home. ?Use bright outdoor lighting. ?Clear any walking paths of anything that might make someone trip, such as rocks or tools. ?Regularly check to see if handrails are loose or broken. Make sure that both sides of any steps have handrails. ?Any raised decks and porches should have guardrails on the edges. ?Have any leaves, snow, or ice cleared regularly. ?Use sand or salt on walking paths during winter. ?Clean up any spills in your garage right away. This includes oil or grease spills. ?What can I do in the bathroom? ?Use night lights. ?Install grab bars by the toilet and in the tub and shower. Do not use towel bars as grab bars. ?Use non-skid mats or decals in the tub or shower. ?If you need to sit down in the shower, use a plastic, non-slip stool. ?Keep the floor dry. Clean up any water that spills on the floor as soon as it happens. ?Remove soap buildup in the tub or shower regularly. ?Attach bath mats securely with double-sided non-slip rug tape. ?Do not have throw rugs and other things on the floor that can make you trip. ?What can I do in the bedroom? ?Use night lights. ?Make sure that you have a light by your bed that  is easy to reach. ?Do not use any sheets or blankets that are too big for your bed. They should not hang down onto the floor. ?Have a firm chair that has side arms. You can use this for support while you get dressed. ?Do not have throw rugs and other things on the floor that can make you trip. ?What can I do in the kitchen? ?Clean up any spills right away. ?Avoid walking on wet floors. ?Keep items that you use a lot in easy-to-reach places. ?If you need to reach something above you, use a strong step stool that has a grab bar. ?Keep electrical cords out of the way. ?Do not use floor polish or wax that makes floors slippery. If you must use wax, use non-skid floor wax. ?Do not have throw rugs and other things on the floor that can make you trip. ?What can I do with my stairs? ?Do not leave any items on the stairs. ?Make sure that there are handrails on both sides of the stairs and use them. Fix handrails that are broken or loose. Make sure that handrails are as long as the stairways. ?Check any carpeting to make sure that it is firmly attached to the stairs. Fix any carpet that is loose or worn. ?Avoid having throw rugs at the top or bottom of the stairs. If you do have throw rugs, attach them  to the floor with carpet tape. ?Make sure that you have a light switch at the top of the stairs and the bottom of the stairs. If you do not have them, ask someone to add them for you. ?What else can I do to help prevent falls? ?Wear shoes that: ?Do not have high heels. ?Have rubber bottoms. ?Are comfortable and fit you well. ?Are closed at the toe. Do not wear sandals. ?If you use a stepladder: ?Make sure that it is fully opened. Do not climb a closed stepladder. ?Make sure that both sides of the stepladder are locked into place. ?Ask someone to hold it for you, if possible. ?Clearly mark and make sure that you can see: ?Any grab bars or handrails. ?First and last steps. ?Where the edge of each step is. ?Use tools that help you  move around (mobility aids) if they are needed. These include: ?Canes. ?Walkers. ?Scooters. ?Crutches. ?Turn on the lights when you go into a dark area. Replace any light bulbs as soon as they burn out.

## 2021-07-22 ENCOUNTER — Other Ambulatory Visit: Payer: Self-pay | Admitting: Family Medicine

## 2021-07-22 DIAGNOSIS — E039 Hypothyroidism, unspecified: Secondary | ICD-10-CM

## 2021-07-23 MED ORDER — LEVOTHYROXINE SODIUM 50 MCG PO TABS: 50.0000 ug | ORAL_TABLET | Freq: Every day | ORAL | 1 refills | Status: DC

## 2021-07-23 NOTE — Telephone Encounter (Signed)
Attempted to call patient- to see if using [facility]- no response.  ?Rx 12/21/20 #90 3RF- [facility] ?Requested Prescriptions  ?Pending Prescriptions Disp Refills  ?? levothyroxine (SYNTHROID) 50 MCG tablet [Pharmacy Med Name: LEVOTHYROXINE 50 MCG TABLET] 10 tablet   ?  Sig: TAKE 1 TABLET BY MOUTH EVERY DAY  ?  ? Endocrinology:  Hypothyroid Agents Passed - 07/22/2021  2:12 PM  ?  ?  Passed - TSH in normal range and within 360 days  ?  TSH  ?Date Value Ref Range Status  ?02/09/2021 2.970 0.450 - 4.500 uIU/mL Final  ?   ?  ?  Passed - Valid encounter within last 12 months  ?  Recent Outpatient Visits   ?      ? 2 weeks ago Essential (primary) hypertension  ? [facility] Maple Hudson., MD  ? 2 months ago Gastroesophageal reflux disease, unspecified whether esophagitis present  ? [facility], Lillia Abed, PA-C  ? 5 months ago MCI (mild cognitive impairment)  ? [facility] Maple Hudson., MD  ? 9 months ago MCI (mild cognitive impairment)  ? [facility] Maple Hudson., MD  ? 1 year ago MCI (mild cognitive impairment)  ? [facility] Maple Hudson., MD  ?  ?  ?Future Appointments   ?        ? In 5 months Maple Hudson., MD [facility]  ?  ? ?  ?  ?  ? ?

## 2021-07-23 NOTE — Telephone Encounter (Signed)
Pt son is calling [facility] ?

## 2021-07-23 NOTE — Addendum Note (Signed)
Addended by: Nena Polio on: 07/23/2021 04:54 PM ? ? Modules accepted: Orders ? ?

## 2021-07-23 NOTE — Telephone Encounter (Signed)
Requested Prescriptions  ?Pending Prescriptions Disp Refills  ?? levothyroxine (SYNTHROID) 50 MCG tablet 90 tablet 1  ?  Sig: Take 1 tablet (50 mcg total) by mouth daily.  ?  ? Endocrinology:  Hypothyroid Agents Passed - 07/23/2021  4:54 PM  ?  ?  Passed - TSH in normal range and within 360 days  ?  TSH  ?Date Value Ref Range Status  ?02/09/2021 2.970 0.450 - 4.500 uIU/mL Final  ?   ?  ?  Passed - Valid encounter within last 12 months  ?  Recent Outpatient Visits   ?      ? 2 weeks ago Essential (primary) hypertension  ? [facility] Jerrol Banana., MD  ? 2 months ago Gastroesophageal reflux disease, unspecified whether esophagitis present  ? [facility] Mins, Ria Comment, PA-C  ? 5 months ago MCI (mild cognitive impairment)  ? [facility] Jerrol Banana., MD  ? 9 months ago MCI (mild cognitive impairment)  ? [facility] Jerrol Banana., MD  ? 1 year ago MCI (mild cognitive impairment)  ? [facility] Jerrol Banana., MD  ?  ?  ?Future Appointments   ?        ? In 5 months Jerrol Banana., MD [facility]  ?  ? ?  ?  ?  ?Refused Prescriptions Disp Refills  ?? levothyroxine (SYNTHROID) 50 MCG tablet [Pharmacy Med Name: LEVOTHYROXINE 50 MCG TABLET] 10 tablet   ?  Sig: TAKE 1 TABLET BY MOUTH EVERY DAY  ?  ? Endocrinology:  Hypothyroid Agents Passed - 07/23/2021  4:54 PM  ?  ?  Passed - TSH in normal range and within 360 days  ?  TSH  ?Date Value Ref Range Status  ?02/09/2021 2.970 0.450 - 4.500 uIU/mL Final  ?   ?  ?  Passed - Valid encounter within last 12 months  ?  Recent Outpatient Visits   ?      ? 2 weeks ago Essential (primary) hypertension  ? [facility] Jerrol Ban[facility] MD  ? 2 months ago Gastroesophageal reflux disease, unspecified whether esophagitis present  ? [facility] Thedore Mins, Ria Comment, PA-C  ? 5 months ago MCI (mild cognitive impairment)  ?  [facility] Jerrol Banana., MD  ? 9 months ago MCI (mild cognitive impairment)  ? [facility] Jerrol Banana., MD  ? 1 year ago MCI (mild cognitive impairment)  ? [facility] Jerrol Banana., MD  ?  ?  ?Future Appointments   ?        ? In 5 months Jerrol Banana., MD [facility]  ?  ? ?  ?  ?  ? ?

## 2021-08-10 ENCOUNTER — Ambulatory Visit: Payer: Medicare Other | Admitting: Family Medicine

## 2021-08-24 ENCOUNTER — Encounter: Payer: Self-pay | Admitting: Family Medicine

## 2021-09-17 ENCOUNTER — Telehealth: Payer: Self-pay | Admitting: Family Medicine

## 2021-09-17 NOTE — Telephone Encounter (Signed)
Pt Son Heidi Yoder- [phone] is calling to see if paperwork for Handicapped decal has been completed?

## 2021-09-21 NOTE — Telephone Encounter (Signed)
Molly Maduro was advised form is ready.

## 2021-09-27 ENCOUNTER — Other Ambulatory Visit: Payer: Self-pay | Admitting: Physician Assistant

## 2021-09-27 DIAGNOSIS — K219 Gastro-esophageal reflux disease without esophagitis: Secondary | ICD-10-CM

## 2021-10-04 ENCOUNTER — Ambulatory Visit: Payer: Self-pay | Admitting: *Deleted

## 2021-10-04 NOTE — Telephone Encounter (Signed)
Summary: discuss medication directions   Patient's son requesting a call back to discuss the directions on how patient is to take omeprazole (PRILOSEC) 20 MG capsule and levothyroxine (SYNTHROID) 50 MCG tablet   Son states the medicine is to be taken  4 hours apart and inquiring if patient can take medication together   Please advise      Reason for Disposition . [1] Caller has NON-URGENT medicine question about med that PCP prescribed AND [2] triager unable to answer question  Answer Assessment - Initial Assessment Questions 1. NAME of MEDICATION: "What medicine are you calling about?"     Omeprazole and levothyroxine  2. QUESTION: "What is your question?" (e.g., double dose of medicine, side effect)     Can they be taken together- or how much time between. Patient has been taking them together for a long time 3. PRESCRIBING HCP: "Who prescribed it?" Reason: if prescribed by specialist, call should be referred to that group.     PCP  Protocols used: Medication Question Call-A-AH

## 2021-10-04 NOTE — Telephone Encounter (Signed)
Patient has been taking both together( omeprazole and levothyroxine) at the same time for quite some time- he just noticed that the instructions for levothyroxine are to take before eating or other medications. Son would like to know if it is ok to continue as has been- or should he change something.

## 2021-10-04 NOTE — Telephone Encounter (Signed)
Please advise 

## 2021-10-05 NOTE — Telephone Encounter (Signed)
Patient's son called for response- advised OK as is per PCP.

## 2021-12-09 ENCOUNTER — Other Ambulatory Visit: Payer: Self-pay | Admitting: Family Medicine

## 2021-12-09 DIAGNOSIS — E039 Hypothyroidism, unspecified: Secondary | ICD-10-CM

## 2021-12-24 ENCOUNTER — Other Ambulatory Visit: Payer: Self-pay | Admitting: Physician Assistant

## 2021-12-24 NOTE — Telephone Encounter (Signed)
Requested Prescriptions  Pending Prescriptions Disp Refills  . simvastatin (ZOCOR) 20 MG tablet [Pharmacy Med Name: SIMVASTATIN 20 MG TABLET] 90 tablet 1    Sig: TAKE 1 TABLET BY MOUTH EVERY DAY     Cardiovascular:  Antilipid - Statins Failed - 12/24/2021  2:19 AM      Failed - Lipid Panel in normal range within the last 12 months    Cholesterol, Total  Date Value Ref Range Status  02/09/2021 196 100 - 199 mg/dL Final   LDL Cholesterol (Calc)  Date Value Ref Range Status  01/02/2017 102 (H) mg/dL (calc) Final    Comment:    Reference range: <100 . Desirable range <100 mg/dL for primary prevention;   <70 mg/dL for patients with CHD or diabetic patients  with > or = 2 CHD risk factors. Marland Kitchen LDL-C is now calculated using the Martin-Hopkins  calculation, which is a validated novel method providing  better accuracy than the Friedewald equation in the  estimation of LDL-C.  Horald Pollen et al. Lenox Ahr. 8891;694(50): 2061-2068  (http://education.Quest[employer].com/faq/FAQ164)    LDL Chol Calc (NIH)  Date Value Ref Range Status  02/09/2021 102 (H) 0 - 99 mg/dL Final   HDL  Date Value Ref Range Status  02/09/2021 59 >39 mg/dL Final   Triglycerides  Date Value Ref Range Status  02/09/2021 203 (H) 0 - 149 mg/dL Final         Passed - Patient is not pregnant      Passed - Valid encounter within last 12 months    Recent Outpatient Visits          5 months ago Essential (primary) hypertension   [facility] Maple Hudson., MD   7 months ago Gastroesophageal reflux disease, unspecified whether esophagitis present   [facility] Edwards, [place], PA-C   10 months ago MCI (mild cognitive impairment)   [facility] Maple Hudson., MD   1 year ago MCI (mild cognitive impairment)   [facility] Maple Hudson., MD   1 year ago MCI (mild cognitive impairment)   [facility] Maple Hudson.,  MD      Future Appointments            In 1 week Maple Hudson., MD [facility]

## 2022-01-03 ENCOUNTER — Encounter: Payer: Self-pay | Admitting: Family Medicine

## 2022-01-06 ENCOUNTER — Encounter: Payer: Self-pay | Admitting: Family Medicine

## 2022-01-06 ENCOUNTER — Ambulatory Visit: Payer: Medicare Other | Admitting: Family Medicine

## 2022-01-06 VITALS — BP 138/58 | HR 64 | Temp 97.6°F | Wt 134.0 lb

## 2022-01-06 DIAGNOSIS — E039 Hypothyroidism, unspecified: Secondary | ICD-10-CM | POA: Diagnosis not present

## 2022-01-06 DIAGNOSIS — E7800 Pure hypercholesterolemia, unspecified: Secondary | ICD-10-CM

## 2022-01-06 DIAGNOSIS — R269 Unspecified abnormalities of gait and mobility: Secondary | ICD-10-CM

## 2022-01-06 DIAGNOSIS — I10 Essential (primary) hypertension: Secondary | ICD-10-CM | POA: Diagnosis not present

## 2022-01-06 DIAGNOSIS — M4135 Thoracogenic scoliosis, thoracolumbar region: Secondary | ICD-10-CM

## 2022-01-06 DIAGNOSIS — E559 Vitamin D deficiency, unspecified: Secondary | ICD-10-CM

## 2022-01-06 DIAGNOSIS — Z23 Encounter for immunization: Secondary | ICD-10-CM

## 2022-01-06 DIAGNOSIS — G3184 Mild cognitive impairment, so stated: Secondary | ICD-10-CM | POA: Diagnosis not present

## 2022-01-06 NOTE — Progress Notes (Unsigned)
I,Roshena L Chambers,acting as a scribe for Megan Mans, MD.,have documented all relevant documentation on the behalf of Megan Mans, MD,as directed by  Megan Mans, MD while in the presence of Megan Mans, MD.   Established patient visit   Patient: Heidi Yoder   DOB: 28-Dec-1929   88 y.o. Female  MRN: [id number] Visit Date: 01/06/2022  Today's healthcare provider: Megan Mans, MD   Chief Complaint  Patient presents with   Follow-up   Subjective    HPI  This is a lengthy visit for this long time patient who comes in today with her son who checks in on her daily according to the history and her grandson who has power of attorney.  They request a shower seat and a rollator.  I think this is  very reasonable.  She has been coming less steady on her feet.  Had no recent falls.  They also ask about a home health evaluation and I think this is very reasonable also.  Follow up for Mild cognitive impairment:  The patient was last seen for this 6 months ago. Changes made at last visit include; Increased donepezil from 5 to 10 mg daily.  Follow-up 4 to 6 months. Patients son reports her memory has worsened.   -----------------------------------------------------------------------------------------    01/06/2022   11:00 AM 07/06/2021   11:09 AM 02/09/2021    1:49 PM  MMSE - Mini Mental State Exam  Orientation to time 2 2 5   Orientation to Place 5 5 5   Registration 3 3 3   Attention/ Calculation 5 4 5   Recall 0 3 0  Language- name 2 objects 2 2 2   Language- repeat 1 1 1   Language- follow 3 step command 3 3 3   Language- read & follow direction 1 1 1   Write a sentence 1 1 0  Copy design 1 1 1   Total score 24 26 26       Medications: Outpatient Medications Prior to Visit  Medication Sig   CALCIUM-VITAMIN D PO Take 600 mg by mouth 2 (two) times daily.   Cyanocobalamin (VITAMIN B 12 PO) Take 1,000 mcg by mouth daily.   docusate sodium  (COLACE) 100 MG capsule Take 200 mg by mouth daily.   donepezil (ARICEPT) 10 MG tablet Take 1 tablet (10 mg total) by mouth at bedtime.   hydrochlorothiazide (HYDRODIURIL) 25 MG tablet Take 1 tablet (25 mg total) by mouth daily.   Lactobacillus (ACIDOPHILUS) 100 MG CAPS Take 100 mg by mouth daily.   levothyroxine (SYNTHROID) 50 MCG tablet TAKE 1 TABLET BY MOUTH EVERY DAY   Multiple Vitamins-Minerals (MULTIVITAMIN WOMEN PO) Take by mouth.   Multiple Vitamins-Minerals (PRESERVISION AREDS 2 PO) Take 2 capsules by mouth daily.   omeprazole (PRILOSEC) 20 MG capsule TAKE 1 CAPSULE BY MOUTH EVERY DAY   simvastatin (ZOCOR) 20 MG tablet TAKE 1 TABLET BY MOUTH EVERY DAY   No facility-administered medications prior to visit.    Review of Systems  Constitutional:  Negative for appetite change, chills, fatigue and fever.  Respiratory:  Negative for chest tightness and shortness of breath.   Cardiovascular:  Negative for chest pain and palpitations.  Gastrointestinal:  Negative for abdominal pain, nausea and vomiting.  Neurological:  Negative for dizziness and weakness.    Last hemoglobin A1c No results found for: "HGBA1C"     Objective    BP (!) 138/58 (BP Location: Right Arm, Patient Position: Sitting, Cuff Size: Large)  Pulse 64   Temp 97.6 F (36.4 C) (Oral)   Wt 134 lb (60.8 kg)   SpO2 97% Comment: room air  BMI 26.17 kg/m  BP Readings from Last 3 Encounters:  01/06/22 (!) 138/58  07/06/21 132/79  04/30/21 (!) 144/71   Wt Readings from Last 3 Encounters:  01/06/22 134 lb (60.8 kg)  07/19/21 141 lb (64 kg)  07/06/21 141 lb (64 kg)      Physical Exam Vitals reviewed.  Constitutional:      Appearance: She is well-developed.  HENT:     Head: Normocephalic and atraumatic.     Right Ear: External ear normal.     Left Ear: External ear normal.     Nose: Nose normal.  Eyes:     General: No scleral icterus.    Conjunctiva/sclera: Conjunctivae normal.     Comments: Right pupil  dilated,irregular.  Cardiovascular:     Rate and Rhythm: Normal rate and regular rhythm.     Heart sounds: Normal heart sounds.  Pulmonary:     Effort: Pulmonary effort is normal.     Breath sounds: Normal breath sounds.  Abdominal:     Palpations: Abdomen is soft.  Musculoskeletal:     Comments: Significant thoracic kyphosis.  Skin:    General: Skin is warm and dry.  Neurological:     General: No focal deficit present.     Mental Status: She is alert and oriented to person, place, and time.  Psychiatric:        Mood and Affect: Mood normal.        Behavior: Behavior normal.       No results found for any visits on 01/06/22.  Assessment & Plan     1. MCI (mild cognitive impairment) This is slowly worsening and she scores a 24/30 today. - CBC with Differential/Platelet - Comprehensive Metabolic Panel (CMET) - TSH - AMB Referral to [facility] Coordinaton  2. Hypothyroidism, unspecified type  - CBC with Differential/Platelet - Comprehensive Metabolic Panel (CMET) - TSH  3. Essential (primary) hypertension  - CBC with Differential/Platelet - Comprehensive Metabolic Panel (CMET) - AMB Referral to [facility] Coordinaton  4. Need for immunization against influenza  - Flu Vaccine QUAD High Dose(Fluad)  5. Impaired gait Shower seat and rollator are written.  Would like to refer her to physical therapy to learn how to walk correctly with a walker with wheels/rollator and to lower her fall risk with her unsteady gait. - Ambulatory referral to Physical Therapy - AMB Referral to [facility]ordin[facility]  6. Hypercholesteremia At 89 soon-to-be 89 we will stop her simvastatin. - AMB Referral to [facility]   No follow-ups on file.      I, Megan Mans, MD, have reviewed all documentation for this visit. The documentation on 01/10/22 for the exam, diagnosis, procedures, and orders are all accurate and complete.    Ismahan Lippman Wendelyn Breslow, MD   [facility] [phone] (phone) [phone] (fax)  [facility]

## 2022-01-06 NOTE — Patient Instructions (Signed)
Stop Simvastatin

## 2022-01-07 ENCOUNTER — Telehealth: Payer: Self-pay

## 2022-01-07 NOTE — Telephone Encounter (Signed)
Copied from CRM [id number]. Topic: General - Other >> Jan 07, 2022 12:06 PM Esperanza Sheets wrote: Caller states pt was advised that her forms were ready for pickup  Caller states her will come by the office on 9-18 to pick up forms  Advised caller to have his ID

## 2022-01-10 ENCOUNTER — Telehealth: Payer: Self-pay

## 2022-01-10 NOTE — Telephone Encounter (Signed)
Please advise 

## 2022-01-10 NOTE — Telephone Encounter (Signed)
Copied from [place] [id number]. Topic: Referral - Status >> Jan 10, 2022  3:24 PM Leilani Able wrote: Reason for CRM: Call coming in from [place], states do not have the staffing to do gait referrals, can not handle recent referral

## 2022-01-11 ENCOUNTER — Telehealth: Payer: Self-pay

## 2022-01-11 NOTE — Chronic Care Management (AMB) (Signed)
  Care Coordination   Note   01/11/2022 Name: Heidi Yoder MRN: [id number] DOB: [birth year]  Heidi Yoder is a 88 y.o. year old female who sees Jerrol Banana., MD for primary care. I reached out to Ricardo Jericho by phone today to offer care coordination services.  Ms. Kuba was given information about Care Coordination services today including:   The Care Coordination services include support from the care team which includes your Nurse Coordinator, Clinical Social Worker, or Pharmacist.  The Care Coordination team is here to help remove barriers to the health concerns and goals most important to you. Care Coordination services are voluntary, and the patient may decline or stop services at any time by request to their care team member.   Care Coordination Consent Status: Patient agreed to services and verbal consent obtained.   Follow up plan:  Telephone appointment with care coordination team member scheduled for:  01/17/2022  Encounter Outcome:  Pt. Scheduled  Noreene Larsson, [place], [place] [id number] Direct Dial: [phone] [email or web address]

## 2022-01-12 ENCOUNTER — Telehealth: Payer: Self-pay

## 2022-01-12 NOTE — Telephone Encounter (Signed)
Received fax. Given to provider to sign.

## 2022-01-12 NOTE — Telephone Encounter (Signed)
Copied from [place] [phone]. Topic: General - Other >> Jan 12, 2022  1:29 PM Sabas Sous wrote: Reason for CRM: Pt's son called reporting that [place] have sent requests on Friday and today for additional notes to provide the insurance for the patient's walker prescription.  Best contact: [phone]

## 2022-01-13 NOTE — Telephone Encounter (Signed)
Form was signed and sent to fax.

## 2022-01-17 ENCOUNTER — Emergency Department: Payer: Medicare Other

## 2022-01-17 ENCOUNTER — Other Ambulatory Visit: Payer: Self-pay

## 2022-01-17 ENCOUNTER — Emergency Department
Admission: EM | Admit: 2022-01-17 | Discharge: 2022-01-18 | Disposition: A | Discharge status: Home / Self Care | Payer: Medicare Other | Attending: Emergency Medicine | Admitting: Emergency Medicine

## 2022-01-17 ENCOUNTER — Telehealth: Payer: Self-pay

## 2022-01-17 ENCOUNTER — Encounter: Payer: Self-pay | Admitting: Family Medicine

## 2022-01-17 DIAGNOSIS — R55 Syncope and collapse: Secondary | ICD-10-CM | POA: Insufficient documentation

## 2022-01-17 DIAGNOSIS — E039 Hypothyroidism, unspecified: Secondary | ICD-10-CM | POA: Insufficient documentation

## 2022-01-17 DIAGNOSIS — N179 Acute kidney failure, unspecified: Secondary | ICD-10-CM | POA: Insufficient documentation

## 2022-01-17 DIAGNOSIS — N390 Urinary tract infection, site not specified: Secondary | ICD-10-CM

## 2022-01-17 DIAGNOSIS — I10 Essential (primary) hypertension: Secondary | ICD-10-CM | POA: Diagnosis not present

## 2022-01-17 LAB — COMPREHENSIVE METABOLIC PANEL
ALT: 14 U/L (ref 0–44)
AST: 26 U/L (ref 15–41)
Albumin: 3.7 g/dL (ref 3.5–5.0)
Alkaline Phosphatase: 60 U/L (ref 38–126)
Anion gap: 9 (ref 5–15)
BUN: 29 mg/dL — ABNORMAL HIGH (ref 8–23)
CO2: 30 mmol/L (ref 22–32)
Calcium: 9.5 mg/dL (ref 8.9–10.3)
Chloride: 101 mmol/L (ref 98–111)
Creatinine, Ser: 1.47 mg/dL — ABNORMAL HIGH (ref 0.44–1.00)
GFR, Estimated: 34 mL/min — ABNORMAL LOW (ref >60)
Glucose, Bld: 169 mg/dL — ABNORMAL HIGH (ref 70–99)
Potassium: 3 mmol/L — ABNORMAL LOW (ref 3.5–5.1)
Sodium: 140 mmol/L (ref 135–145)
Total Bilirubin: 0.8 mg/dL (ref 0.3–1.2)
Total Protein: 7.3 g/dL (ref 6.5–8.1)

## 2022-01-17 LAB — TROPONIN I (HIGH SENSITIVITY)
Troponin I (High Sensitivity): 7 ng/L (ref <18)
Troponin I (High Sensitivity): 7 ng/L (ref <18)

## 2022-01-17 LAB — CBC
HCT: 38.7 % (ref 36.0–46.0)
Hemoglobin: 12.5 g/dL (ref 12.0–15.0)
MCH: 30.8 pg (ref 26.0–34.0)
MCHC: 32.3 g/dL (ref 30.0–36.0)
MCV: 95.3 fL (ref 80.0–100.0)
Platelets: 263 10*3/uL (ref 150–400)
RBC: 4.06 MIL/uL (ref 3.87–5.11)
RDW: 12.7 % (ref 11.5–15.5)
WBC: 10.7 10*3/uL — ABNORMAL HIGH (ref 4.0–10.5)
nRBC: 0 % (ref 0.0–0.2)

## 2022-01-17 MED ORDER — SODIUM CHLORIDE 0.9 % IV BOLUS
1000.0000 mL | Freq: Once | INTRAVENOUS | Status: AC
  Administered 2022-01-18: 1000 mL via INTRAVENOUS

## 2022-01-17 MED ORDER — POTASSIUM CHLORIDE 20 MEQ PO PACK
60.0000 meq | PACK | Freq: Once | ORAL | Status: AC
  Administered 2022-01-18: 60 meq via ORAL
  Filled 2022-01-17: qty 3

## 2022-01-17 NOTE — ED Provider Triage Note (Signed)
Emergency Medicine Provider Triage Evaluation Note  Heidi Yoder , a 88 y.o. female  was evaluated in triage.  Pt complains of near syncope.  Patient was at her primary care provider's office about to receive blood work.  Patient had no complaints today, this was a routine ankle sprain.  Patient then felt near syncopal for roughly 5 to 10 minutes.  Did not actually have syncope.  There is no slurred speech, unilateral weakness or facial droop according to the family who is with the patient.  Patient was brought directly to the ED.  Symptoms have resolved at this time..  Review of Systems  Positive: Near syncope Negative: Pain, vision changes, unilateral weakness, slurred speech, facial droop, chest pain  Physical Exam  BP (!) 128/56 (BP Location: Right Arm)   Pulse 60   Temp 97.6 F (36.4 C) (Oral)   Resp 18   SpO2 95%  Gen:   Awake, no distress   Resp:  Normal effort  MSK:   Moves extremities without difficulty  Other:  Cranial nerves grossly intact  Medical Decision Making  Medically screening exam initiated at 3:48 PM.  Appropriate orders placed.  Heidi Yoder was informed that the remainder of the evaluation will be completed by another provider, this initial triage assessment does not replace that evaluation, and the importance of remaining in the ED until their evaluation is complete.  Patient presents with near syncopal episode.  She will have labs, urinalysis, CT scan of the head, chest x-ray, EKG   Darletta Moll, PA-C 01/17/22 1548

## 2022-01-17 NOTE — Telephone Encounter (Signed)
Notes printed and form given to provider to sign.

## 2022-01-17 NOTE — Telephone Encounter (Signed)
Pt son is calling to check on the status of paper work to have been sent back to [employer] for a walker. Please advise [phone]

## 2022-01-17 NOTE — Patient Outreach (Signed)
  Care Coordination   01/17/2022 Name: Heidi Yoder MRN: [id number] DOB: March 26, 1930   Care Coordination Outreach Attempts:  An unsuccessful telephone outreach was attempted for a scheduled appointment today.  Follow Up Plan:  Additional outreach attempts will be made to offer the patient care coordination information and services.   Encounter Outcome:  No Answer  Care Coordination Interventions Activated:  No   Care Coordination Interventions:  No, not indicated    Noreene Larsson RN, MSN, [facility]  Mobile: [phone]

## 2022-01-17 NOTE — ED Triage Notes (Signed)
First Nurse Note:  Pt via EMS from [facility]. EMS reports that the patient was "minimally responsive" for approx 5-10 mins, per EMS no slurred speech with them. Pt is at her baseline per EMS.

## 2022-01-17 NOTE — ED Triage Notes (Signed)
Pt presents to ED with c/o of having a near syncope episode. Pt is A&Ox4. Pt denies any CP or Sob or dizziness. Pt was being seen at PCP for blood work.

## 2022-01-17 NOTE — ED Provider Notes (Incomplete)
[facility] Provider Note    Event Date/Time   First MD Initiated Contact with Patient 01/17/22 2344     (approximate)   History   Near Syncope   HPI  Heidi Yoder is a 88 y.o. female   Past medical history of HTN, hypothyroid, mild cognitive impairment, peripheral neuropathy,  chronic back pain,  Presents with syncopal episode at clinic visit today; per note slumped over to the side and lasted several minutes with urinary incontinence; back to baseline when EMS arrived.   She states that she felt lightheaded after being called for blood work at the clinic, had been seated and when she rose to walk, felt lightheaded and does not recall passing out.  Denies chest pain, shortness of breath, palpitations preceding the syncopal episode.  Currently the patient feels well, denies any complaints at the moment, has not been back up on her feet since the incident.  Denies any recent illnesses and specifically denies chest pain, shortness of breath, urinary symptoms, abdominal pain, nausea, vomiting, diarrhea.  No fever.   History was obtained via patient and her son at bedside, and external medical notes reviewed.       Physical Exam   Triage Vital Signs: ED Triage Vitals  Enc Vitals Group     BP 01/17/22 1537 (!) 128/56     Pulse Rate 01/17/22 1537 60     Resp 01/17/22 1537 18     Temp 01/17/22 1537 97.6 F (36.4 C)     Temp Source 01/17/22 1537 Oral     SpO2 01/17/22 1537 95 %     Weight --      Height --      Head Circumference --      Peak Flow --      Pain Score 01/17/22 1538 0     Pain Loc --      Pain Edu? --      Excl. in GC? --     Most recent vital signs: Vitals:   01/18/22 0230 01/18/22 0300  BP: (!) 153/108 121/81  Pulse: 64 64  Resp: 13 12  Temp:    SpO2: 97% 99%    General: Awake, no distress.  CV:  Good peripheral perfusion.  Normal rate and rhythm Resp:  Normal effort.  Lungs clear to auscultation  bilaterally Abd:  No distention.  Nontender Other:  No focal neurological deficits including normal finger-to-nose, no facial asymmetry, normal motor and sensory exam, no visual field cuts, and she does have reproducible subjective lightheadedness upon standing at bedside, gait is normal. Anisocoria noted R>L and asymmetric pupil; pt and son believe baseline from remote R eye surgery    ED Results / Procedures / Treatments   Labs (all labs ordered are listed, but only abnormal results are displayed) Labs Reviewed  CBC - Abnormal; Notable for the following components:      Result Value   WBC 10.7 (*)    All other components within normal limits  COMPREHENSIVE METABOLIC PANEL - Abnormal; Notable for the following components:   Potassium 3.0 (*)    Glucose, Bld 169 (*)    BUN 29 (*)    Creatinine, Ser 1.47 (*)    GFR, Estimated 34 (*)    All other components within normal limits  URINALYSIS, ROUTINE W REFLEX MICROSCOPIC - Abnormal; Notable for the following components:   Color, Urine YELLOW (*)    APPearance CLOUDY (*)    Leukocytes,Ua LARGE (*)  WBC, UA >50 (*)    Bacteria, UA RARE (*)    All other components within normal limits  BASIC METABOLIC PANEL - Abnormal; Notable for the following components:   Glucose, Bld 133 (*)    BUN 27 (*)    Creatinine, Ser 1.16 (*)    Calcium 8.6 (*)    GFR, Estimated 45 (*)    All other components within normal limits  URINE CULTURE  CBG MONITORING, ED  TROPONIN I (HIGH SENSITIVITY)  TROPONIN I (HIGH SENSITIVITY)     I reviewed labs and they are notable for fattening 1.4 from a baseline of 1, urinalysis with leukocytes and bacteria  EKG  ED ECG REPORT I, Lucillie Garfinkel, the attending physician, personally viewed and interpreted this ECG.   Date: 01/17/2022  EKG Time: 1542  Rate: 64  Rhythm: normal sinus rhythm  Axis: normal  Intervals:no ischemia; non specific TWI isolated lead III    RADIOLOGY I independently reviewed and  interpreted CT of the head without IV contrast and see no obvious hemorrhage or midline shift   PROCEDURES:  Critical Care performed: No  Procedures   MEDICATIONS ORDERED IN ED: Medications  potassium chloride (KLOR-CON) packet 60 mEq (60 mEq Oral Given 01/18/22 0105)  sodium chloride 0.9 % bolus 1,000 mL (1,000 mLs Intravenous New Bag/Given 01/18/22 0036)  cefTRIAXone (ROCEPHIN) 1 g in sodium chloride 0.9 % 100 mL IVPB (0 g Intravenous Stopped 01/18/22 0259)    IMPRESSION / MDM / ASSESSMENT AND PLAN / ED COURSE  I reviewed the triage vital signs and the nursing notes.                              Differential diagnosis includes, but is not limited to, syncope clear orthostatic, assess for cardiac etiology, infection, electrolyte derangement.  Less likely CVA given no neurological deficits.   The patient is on the cardiac monitor to evaluate for evidence of arrhythmia and/or significant heart rate changes.  MDM: Work-up significant for acute kidney injury, appears slightly clinically dehydrated with dry mucous membranes on my exam, given IV crystalloid.  Urinary tract infection likely given UA with leukocytes and bacteria, ceftriaxone ordered.  Mild hypokalemia with oral potassium ordered.    She has no t/l spine tenderness so I doubt acute fracture or trauma that was suggested on imaging.   I spoke with the patient and engaged in shared decision-making about disposition at this time for AKI and urinary tract infection; offered admission but she prefers strongly to go home rather than to be admitted. Plan will be for IV fluids, recheck Cr, and abx outpt for UTI and close outpt f/u. Trops flat doubt cardiac etiology iso known AKI and UTI.   Patient remains stable.  Repeat basic metabolic panel shows markedly improved creatinine now 1.1 from 1.4, potassium improved, normalized.  Plan for discharge home with instructions for ongoing hydration by mouth, antibiotics for urinary tract  infection, close PMD follow-up and return precautions given  Patient's presentation is most consistent with acute presentation with potential threat to life or bodily function.       FINAL CLINICAL IMPRESSION(S) / ED DIAGNOSES   Final diagnoses:  Syncope and collapse  Urinary tract infection without hematuria, site unspecified  AKI (acute kidney injury) ([place])     Rx / DC Orders   ED Discharge Orders          Ordered    cephALEXin (KEFLEX) 500  MG capsule  4 times daily        01/18/22 0443             Note:  This document was prepared using Dragon voice recognition software and may include unintentional dictation errors.    Pilar Jarvis, MD 01/18/22 Denny Peon    Pilar Jarvis, MD 01/18/22 5409    Pilar Jarvis, MD 01/18/22 [phone]

## 2022-01-17 NOTE — Progress Notes (Unsigned)
Called to assist minimally responsive patient in waiting area.  Patient presented to office for blood work, and per son, on the way up the elevator, slumped to the side and wasn't responsive. Patient brought to the waiting room by son where she was evaluated by nurse and myself.  On initial assessment, GCS 10, patient would open eyes to verbal command but with no verbal response and obeyed some minor motor commands (would squeeze fingers). In addition, unequal pupils noted (R more dilated than left), EOM not tracking to R side. Urinary incontinence noted. Vitals as listed below.   BP 110/64, HR 78, SpO2 94% Cardiac: RRR Lungs: CTAB Neuro: as above  Given concern for possible stroke, EMS called for transport to ED.  No aspirin given given she was minimally responsive.   Upon EMS arrival few minutes later, patient back to neurological baseline with exception of persistent unequal pupils (strength 4/5 to U/LE bilaterally, alert and oriented to self and time, speech slightly slurred. Extraocular movements intact though still with unequal pupils.   Hearing grossly intact bilaterally.  Tongue protrudes normally with no deviation.  Shoulder shrug, smile symmetric.)    On review of chart, patient not on blood thinners. Son denies recent falls. Reports suspected dementia but no formal diagnosis. No prior h/o stroke, TIA, seizures. No additional medications taken prior to arrival other than chronic medications.    Patient transported to [facility] ED by EMS.  Will forward documentation to PCP.  Rory Percy, DO 01/17/2022 3:36 PM

## 2022-01-18 LAB — BASIC METABOLIC PANEL
Anion gap: 6 (ref 5–15)
BUN: 27 mg/dL — ABNORMAL HIGH (ref 8–23)
CO2: 28 mmol/L (ref 22–32)
Calcium: 8.6 mg/dL — ABNORMAL LOW (ref 8.9–10.3)
Chloride: 107 mmol/L (ref 98–111)
Creatinine, Ser: 1.16 mg/dL — ABNORMAL HIGH (ref 0.44–1.00)
GFR, Estimated: 45 mL/min — ABNORMAL LOW (ref >60)
Glucose, Bld: 133 mg/dL — ABNORMAL HIGH (ref 70–99)
Potassium: 4 mmol/L (ref 3.5–5.1)
Sodium: 141 mmol/L (ref 135–145)

## 2022-01-18 LAB — URINALYSIS, ROUTINE W REFLEX MICROSCOPIC
Bilirubin Urine: NEGATIVE
Glucose, UA: NEGATIVE mg/dL
Hgb urine dipstick: NEGATIVE
Ketones, ur: NEGATIVE mg/dL
Nitrite: NEGATIVE
Protein, ur: NEGATIVE mg/dL
Specific Gravity, Urine: 1.017 (ref 1.005–1.030)
WBC, UA: >50 WBC/hpf — ABNORMAL HIGH (ref 0–5)
pH: 5 (ref 5.0–8.0)

## 2022-01-18 MED ORDER — SODIUM CHLORIDE 0.9 % IV SOLN
1.0000 g | Freq: Once | INTRAVENOUS | Status: AC
  Administered 2022-01-18: 1 g via INTRAVENOUS
  Filled 2022-01-18: qty 10

## 2022-01-18 MED ORDER — CEPHALEXIN 500 MG PO CAPS: 500.0000 mg | ORAL_CAPSULE | Freq: Four times a day (QID) | ORAL | 0 refills | Status: AC

## 2022-01-18 NOTE — Discharge Instructions (Signed)
Rink plenty of fluids to stay well-hydrated.  Avoid sugary beverages like sodas.  Find Pedialyte or similar electrolyte replacement formulas at your local pharmacy to drink.  Take antibiotics as prescribed for urine infection.  See your doctor this week for a follow-up appointment.  Thank you for choosing [place] for your health care today!  Please see your primary doctor this week for a follow up appointment.   If you do not have a primary doctor call the following clinics to establish care:  If you have insurance:  [facility] [phone] [place] [address]   [facility]  [phone] [address] [place] [address]   If you do not have insurance:  [facility]  [phone] [address] [place] [address]  Sometimes, in the early stages of certain disease courses it is difficult to detect in the emergency department evaluation -- so, it is important that you continue to monitor your symptoms and call your doctor right away or return to the emergency department if you develop any new or worsening symptoms.  It was my pleasure to care for you today.   [place] Jacelyn Grip, MD

## 2022-01-18 NOTE — ED Notes (Signed)
Pt's son contacted to let know that pt can be picked up. Pt said he would be here at 0730 at the latest. Pt is repeating same information over and over, as well as asking the same questions over and over. Pt's son says dr is thinking she is in the beginning stages of dementia

## 2022-01-19 LAB — URINE CULTURE

## 2022-01-19 NOTE — Telephone Encounter (Signed)
Returned call to pt's son.

## 2022-01-19 NOTE — Telephone Encounter (Signed)
Pt's son called requesting to speak to the office today about his walker request. He says [employer] is still missing documentation needed to supply walker.   Best contact: [phone]

## 2022-01-19 NOTE — Telephone Encounter (Signed)
Form and notes were sent to fax.

## 2022-01-19 NOTE — Telephone Encounter (Signed)
Form and notes were just faxed this morning.

## 2022-01-20 NOTE — Telephone Encounter (Signed)
Son returned call, to son that forms and notes were faxed over 9/27, caller stated he would ck with them to verfiy.

## 2022-01-24 ENCOUNTER — Encounter: Payer: Self-pay | Admitting: Family Medicine

## 2022-01-24 ENCOUNTER — Ambulatory Visit: Payer: Medicare Other | Admitting: Family Medicine

## 2022-01-24 VITALS — BP 108/67 | HR 64 | Temp 98.7°F | Resp 16 | Wt 139.6 lb

## 2022-01-24 DIAGNOSIS — N390 Urinary tract infection, site not specified: Secondary | ICD-10-CM

## 2022-01-24 DIAGNOSIS — G3184 Mild cognitive impairment, so stated: Secondary | ICD-10-CM | POA: Diagnosis not present

## 2022-01-24 DIAGNOSIS — Z7409 Other reduced mobility: Secondary | ICD-10-CM

## 2022-01-24 DIAGNOSIS — N179 Acute kidney failure, unspecified: Secondary | ICD-10-CM | POA: Diagnosis not present

## 2022-01-24 DIAGNOSIS — Z741 Need for assistance with personal care: Secondary | ICD-10-CM | POA: Diagnosis not present

## 2022-01-24 NOTE — Progress Notes (Signed)
Established patient visit  I,Heidi Yoder,acting as a scribe for [employer], MD.,have documented all relevant documentation on the behalf of Heidi Foster, MD,as directed by  Heidi Foster, MD while in the presence of Heidi Foster, MD.   Patient: Heidi Yoder   DOB: [birth year]   88 y.o. Female  MRN: [id number] Visit Date: 01/24/2022  Today's healthcare provider: [name], MD   Chief Complaint  Patient presents with   Follow-up   Subjective    HPI  Follow up ER visit  Patient was seen in ER for Syncope and Collapse,UTI,AKI on 09/25-09/26 (7 hours). Treatment for this included given IV crystalloid, imaging, labs, ceftriaxone and oral potassium. Patient is taking Keflex 500 mg for UTI. She reports excellent compliance with treatment. She reports this condition is Improved.   Patient's son Mortimer Fries is present for visit and helps with taking care of patient  He reqests calling him to set up home health as patient lives alone and often doesn't hear call    MCI  Son states that he purchased a microwave for the patient as she was starting to forget items on the stove  She has fear about getting in the shower and often needs help with bathing  Patient current lives alone and has been using a walker to help with ambulation, son would like for her to be evaluated by PT due to living alone and safety with mobility in her home   -----------------------------------------------------------------------------------------   Medications: Outpatient Medications Prior to Visit  Medication Sig   CALCIUM-VITAMIN D PO Take 600 mg by mouth 2 (two) times daily.   cephALEXin (KEFLEX) 500 MG capsule Take 1 capsule (500 mg total) by mouth 4 (four) times daily for 10 days.   Cyanocobalamin (VITAMIN B 12 PO) Take 1,000 mcg by mouth daily.   docusate sodium (COLACE) 100 MG capsule Take 200 mg by mouth daily.   donepezil (ARICEPT)  10 MG tablet Take 1 tablet (10 mg total) by mouth at bedtime.   hydrochlorothiazide (HYDRODIURIL) 25 MG tablet Take 1 tablet (25 mg total) by mouth daily.   Lactobacillus (ACIDOPHILUS) 100 MG CAPS Take 100 mg by mouth daily.   levothyroxine (SYNTHROID) 50 MCG tablet TAKE 1 TABLET BY MOUTH EVERY DAY   Multiple Vitamins-Minerals (MULTIVITAMIN WOMEN PO) Take 1 tablet by mouth daily.   Multiple Vitamins-Minerals (PRESERVISION AREDS 2 PO) Take 2 capsules by mouth daily.   omeprazole (PRILOSEC) 20 MG capsule TAKE 1 CAPSULE BY MOUTH EVERY DAY   simvastatin (ZOCOR) 20 MG tablet TAKE 1 TABLET BY MOUTH EVERY DAY   No facility-administered medications prior to visit.    Review of Systems     Objective    BP 108/67 (BP Location: Right Arm, Patient Position: Sitting, Cuff Size: Normal)   Pulse 64   Temp 98.7 F (37.1 C) (Oral)   Resp 16   Wt 139 lb 9.6 oz (63.3 kg)   SpO2 95%   BMI 27.26 kg/m    Physical Exam Vitals reviewed.  Constitutional:      General: She is not in acute distress.    Appearance: Normal appearance. She is not ill-appearing, toxic-appearing or diaphoretic.  Eyes:     Conjunctiva/sclera: Conjunctivae normal.  Cardiovascular:     Rate and Rhythm: Normal rate.     Pulses: Normal pulses.     Heart sounds: Normal heart sounds. No murmur heard.    No friction rub. No gallop.  Pulmonary:  Effort: Pulmonary effort is normal. No respiratory distress.     Breath sounds: Normal breath sounds. No stridor. No wheezing, rhonchi or rales.  Abdominal:     General: Bowel sounds are normal. There is no distension.     Palpations: Abdomen is soft.     Tenderness: There is no abdominal tenderness.  Neurological:     Mental Status: She is alert and oriented to person, place, and time.     Gait: Gait abnormal.     Comments: Patient is hard of hearing 4/5 strength in bilateral lower extremities        Results for orders placed or performed in visit on 73/40/37  Basic  Metabolic Panel (BMET)  Result Value Ref Range   Glucose 130 (H) 70 - 99 mg/dL   BUN 21 8 - 27 mg/dL   Creatinine, Ser 1.13 (H) 0.57 - 1.00 mg/dL   eGFR 47 (L) >59 mL/min/1.73   BUN/Creatinine Ratio 19 12 - 28   Sodium 141 134 - 144 mmol/L   Potassium 4.1 3.5 - 5.2 mmol/L   Chloride 99 96 - 106 mmol/L   CO2 25 20 - 29 mmol/L   Calcium 9.6 8.7 - 10.3 mg/dL  TSH + free T4  Result Value Ref Range   TSH 2.260 0.450 - 4.500 uIU/mL   Free T4 1.35 0.82 - 1.77 ng/dL  CBC  Result Value Ref Range   WBC 7.6 3.4 - 10.8 x10E3/uL   RBC 3.55 (L) 3.77 - 5.28 x10E6/uL   Hemoglobin 11.3 11.1 - 15.9 g/dL   Hematocrit 34.0 34.0 - 46.6 %   MCV 96 79 - 97 fL   MCH 31.8 26.6 - 33.0 pg   MCHC 33.2 31.5 - 35.7 g/dL   RDW 12.1 11.7 - 15.4 %   Platelets 253 150 - 450 x10E3/uL    Assessment & Plan     Problem List Items Addressed This Visit       Genitourinary   AKI (acute kidney injury) ([place])    Creatinine was elevated to 1.4 initially in the ED on 01/17/2022 This improved to 1.1 Counseled patient on importance of oral hydration with water and to consume Dr. Malachi Bonds in moderation, both patient and her son voiced understanding We will recheck metabolic panel today to assess creatinine      Relevant Orders   Basic Metabolic Panel (BMET) (Completed)   Urinary tract infection without hematuria    Hospital problem, improving She will continue Keflex 500 mg until 10/28/2021 as originally prescribed Recommended that she complete this antibiotic course Patient is no longer symptomatic which included dysuria and urinary frequency         Other   MCI (mild cognitive impairment) - Primary    Chronic This problem appears to be worsening We will follow-up on metabolic panel, TSH and CBC as recommended by patient's previous PCP We will also recommend patient for chronic care management being that she lives alone and family is requesting personal care services to help with bathing       Relevant  Orders   TSH + free T4 (Completed)   CBC (Completed)   AMB Referral to Chronic Care Management Services   Ambulatory referral to Home Health   Other Visit Diagnoses     Requires assistance with activities of daily living (ADL)       Relevant Orders   AMB Referral to Chronic Care Management Services   Ambulatory referral to Home Health   Impaired mobility  Relevant Orders   Ambulatory referral to Home Health          I, Heidi Foster, MD, have reviewed all documentation for this visit. The documentation on 01/25/22 for the exam, diagnosis, procedures, and orders are all accurate and complete.    Heidi Foster, MD  [facility] [phone] (phone) [phone] (fax)  [place]

## 2022-01-25 ENCOUNTER — Telehealth: Payer: Self-pay | Admitting: *Deleted

## 2022-01-25 ENCOUNTER — Encounter: Payer: Self-pay | Admitting: Family Medicine

## 2022-01-25 DIAGNOSIS — N179 Acute kidney failure, unspecified: Secondary | ICD-10-CM | POA: Insufficient documentation

## 2022-01-25 DIAGNOSIS — N390 Urinary tract infection, site not specified: Secondary | ICD-10-CM | POA: Insufficient documentation

## 2022-01-25 LAB — CBC
Hematocrit: 34 % (ref 34.0–46.6)
Hemoglobin: 11.3 g/dL (ref 11.1–15.9)
MCH: 31.8 pg (ref 26.6–33.0)
MCHC: 33.2 g/dL (ref 31.5–35.7)
MCV: 96 fL (ref 79–97)
Platelets: 253 10*3/uL (ref 150–450)
RBC: 3.55 x10E6/uL — ABNORMAL LOW (ref 3.77–5.28)
RDW: 12.1 % (ref 11.7–15.4)
WBC: 7.6 10*3/uL (ref 3.4–10.8)

## 2022-01-25 LAB — BASIC METABOLIC PANEL
BUN/Creatinine Ratio: 19 (ref 12–28)
BUN: 21 mg/dL (ref 8–27)
CO2: 25 mmol/L (ref 20–29)
Calcium: 9.6 mg/dL (ref 8.7–10.3)
Chloride: 99 mmol/L (ref 96–106)
Creatinine, Ser: 1.13 mg/dL — ABNORMAL HIGH (ref 0.57–1.00)
Glucose: 130 mg/dL — ABNORMAL HIGH (ref 70–99)
Potassium: 4.1 mmol/L (ref 3.5–5.2)
Sodium: 141 mmol/L (ref 134–144)
eGFR: 47 mL/min/{1.73_m2} — ABNORMAL LOW (ref >59)

## 2022-01-25 LAB — TSH+FREE T4
Free T4: 1.35 ng/dL (ref 0.82–1.77)
TSH: 2.26 u[IU]/mL (ref 0.450–4.500)

## 2022-01-25 NOTE — Chronic Care Management (AMB) (Unsigned)
  Care Coordination  Outreach Note  01/25/2022 Name: Heidi Yoder MRN: [id number] DOB: 19-Dec-1929   Care Coordination Outreach Attempts: An unsuccessful telephone outreach was attempted today to offer the patient information about available care coordination services as a benefit of their health plan.   Received referral   Follow Up Plan:  Additional outreach attempts will be made to offer the patient care coordination information and services.   Encounter Outcome:  No Answer  Julian Hy, [place] Direct Dial: [phone]

## 2022-01-25 NOTE — Assessment & Plan Note (Signed)
Chronic This problem appears to be worsening We will follow-up on metabolic panel, TSH and CBC as recommended by patient's previous PCP We will also recommend patient for chronic care management being that she lives alone and family is requesting personal care services to help with bathing

## 2022-01-25 NOTE — Assessment & Plan Note (Signed)
Creatinine was elevated to 1.4 initially in the ED on 01/17/2022 This improved to 1.1 Counseled patient on importance of oral hydration with water and to consume Dr. Malachi Bonds in moderation, both patient and her son voiced understanding We will recheck metabolic panel today to assess creatinine

## 2022-01-25 NOTE — Assessment & Plan Note (Signed)
Hospital problem, improving She will continue Keflex 500 mg until 10/28/2021 as originally prescribed Recommended that she complete this antibiotic course Patient is no longer symptomatic which included dysuria and urinary frequency

## 2022-01-27 NOTE — Chronic Care Management (AMB) (Signed)
  Care Coordination   Note   01/27/2022 Name: Heidi Yoder MRN: [id number] DOB: 1929-12-26  Heidi Yoder is a 88 y.o. year old female who sees Simmons-Robinson, Riki Sheer, MD for primary care. I reached out to Ricardo Jericho by phone today to offer care coordination services.  Heidi Yoder was given information about Care Coordination services today including:   The Care Coordination services include support from the care team which includes your Nurse Coordinator, Clinical Social Worker, or Pharmacist.  The Care Coordination team is here to help remove barriers to the health concerns and goals most important to you. Care Coordination services are voluntary, and the patient may decline or stop services at any time by request to their care team member.   Care Coordination Consent Status: Patient agreed to services and verbal consent obtained.   Follow up plan:  Telephone appointment with care coordination team member scheduled for:  01/31/2022  Encounter Outcome:  Pt. Scheduled from referral   Julian Hy, [place] Direct Dial: [phone]

## 2022-01-28 ENCOUNTER — Telehealth: Payer: Self-pay | Admitting: Family Medicine

## 2022-01-28 NOTE — Telephone Encounter (Signed)
Spoke with patient's POA/HCPOA, Cyd Silence.  Patient's grandson had questions about recent visit with patient and her son, Mortimer Fries. Answered questions regarding CCM and home health PT referral. He is hoping that his schedule will permit being present for future appointments but will also be referring to MyChart for information from visit summaries.    [name], MD  [facility]  [phone]

## 2022-01-31 ENCOUNTER — Ambulatory Visit: Payer: Self-pay | Admitting: *Deleted

## 2022-01-31 NOTE — Patient Outreach (Signed)
  Care Coordination   Initial Visit Note   01/31/2022 Name: NEEKA URISTA MRN: [id number] DOB: Oct 25, 1929  Sindy Messing Benedick is a 88 y.o. year old female who sees Simmons-Robinson, Riki Sheer, MD for primary care. I spoke with  Sindy Messing Quinteros by phone today.  What matters to the patients health and wellness today?  Per patient's son, patient's care need are taken care as this time.    Goals Addressed             This Visit's Progress    Care Management activities       Care Coordination Interventions: Phone call to patient's son Herbie Baltimore to discuss in home care needs Patient's son states that patient has a positive support system. He confirms that he and his brother assist with patient's daily care including meals, grocery shopping, medication management and transportation to  medical appointments and church , their wives assists with ADL's Patient has a med alert system Patient's son states no need for in home care at time stating that between his family and hiss brother's family , patient's care needs are take care of at this time.  Patient's son provided with this social workers contact information should patient's situation change and resources are needed         SDOH assessments and interventions completed:  Yes  SDOH Interventions Today    Flowsheet Row Most Recent Value  SDOH Interventions   Food Insecurity Interventions Intervention Not Indicated  Housing Interventions Intervention Not Indicated  Transportation Interventions Intervention Not Indicated  Physical Activity Interventions Intervention Not Indicated  Stress Interventions Intervention Not Indicated  Social Connections Interventions Intervention Not Indicated        Care Coordination Interventions Activated:  Yes  Care Coordination Interventions:  Yes, provided   Follow up plan: No further intervention required.   Encounter Outcome:  Pt. Visit Completed

## 2022-01-31 NOTE — Patient Instructions (Signed)
Visit Information  Thank you for taking time to visit with me today. Please don't hesitate to contact me if I can be of assistance to you.   Following are the goals we discussed today:   Goals Addressed             This Visit's Progress    Care Management activities       Care Coordination Interventions: Phone call to patient's son Herbie Baltimore to discuss in home care needs Patient's son states that patient has a positive support system. He confirms that he and his brother assist with patient's daily care including meals, grocery shopping, medication management and transportation to  medical appointments and church , their wives assists with ADL's Patient has a med alert system Patient's son states no need for in home care at time stating that between his family and hiss brother's family , patient's care needs are take care of at this time.  Patient's son provided with this social workers contact information should patient's situation change and resources are needed         If you are experiencing a [place] or [place] or need someone to talk to, please call 911   Patient verbalizes understanding of instructions and care plan provided today and agrees to view in [place]. Active MyChart status and patient understanding of how to access instructions and care plan via MyChart confirmed with patient.     No further follow up required: patient's son to call this Education officer, museum with any additional community resource needs  [place], [place] Worker  [facility] Care Management [phone]

## 2022-02-01 ENCOUNTER — Telehealth: Payer: Self-pay

## 2022-02-01 NOTE — Telephone Encounter (Signed)
Heidi Yoder with [facility] called back and is asking for verbal orders  PT for once a week for 9 weeks Add OT evaluation and Speech Therapy evaluation.  Per Dr. Zigmund Daniel for verbal orders. Verbal orders given to [facility].

## 2022-02-01 NOTE — Telephone Encounter (Signed)
LM for [facility] to verify on which orders she is needing clarification.

## 2022-02-05 ENCOUNTER — Other Ambulatory Visit: Payer: Self-pay | Admitting: Family Medicine

## 2022-02-05 DIAGNOSIS — G3184 Mild cognitive impairment, so stated: Secondary | ICD-10-CM

## 2022-02-07 NOTE — Telephone Encounter (Signed)
Requested Prescriptions  Pending Prescriptions Disp Refills  . donepezil (ARICEPT) 10 MG tablet [Pharmacy Med Name: DONEPEZIL HCL 10 MG TABLET] 90 tablet 1    Sig: TAKE 1 TABLET BY MOUTH EVERYDAY AT BEDTIME     Neurology:  Alzheimer's Agents Passed - 02/05/2022  9:15 AM      Passed - Valid encounter within last 6 months    Recent Outpatient Visits          2 weeks ago MCI (mild cognitive impairment)   [facility] Simmons-Robinson, Financial risk analyst, MD   1 month ago MCI (mild cognitive impairment)   [facility] Jerrol Banana., MD   7 months ago Essential (primary) hypertension   [facility] Jerrol Banana., MD   9 months ago Gastroesophageal reflux disease, unspecified whether esophagitis present   [facility] Mins, [place], PA-C   12 months ago MCI (mild cognitive impairment)   [facility] Rosanna Randy, Retia Passe., MD      Future Appointments            In 2 weeks Simmons-Robinson, Riki Sheer, MD [facility], [place]   In 3 months Simmons-Robinson, Sheldon, MD [facility], [place]

## 2022-02-08 ENCOUNTER — Ambulatory Visit: Payer: Self-pay

## 2022-02-08 ENCOUNTER — Telehealth: Payer: Self-pay | Admitting: Family Medicine

## 2022-02-08 NOTE — Patient Outreach (Signed)
Care Coordination   Follow Up Visit Note   02/08/2022 Name: Heidi Yoder MRN: [id number] DOB: 05/31/1929  Heidi Yoder is a 88 y.o. year old female who sees Simmons-Robinson, Tawanna Cooler, MD for primary care. I spoke with  Heidi Yoder by phone today.  What matters to the patients health and wellness today?  Changes in memory and the patient working with PT    Goals Addressed             This Visit's Progress    RNCM: Effective Management of MCI and other chronic conditions       Care Coordination Interventions: Evaluation of current treatment plan related to MCI and other chronic conditions  and patient's adherence to plan as established by provider Advised patient to call the office for changes in conditions, questions, or concerns Provided education to patient re: monitoring for new safety concerns, changes in mood, anxiety, depression, rapid decline in the patients memory. Discussed safety and fall prevention The patients son states that PT has come out to the home for initial evaluation but he is waiting to hear from "Heidi Yoder" the practitioner that will be working with the pt. The son denies any immediate safety concerns. The patient does live alone but has support from her sons and their spouses. They do not allow the patient to cook any longer and they check on her frequently. They manage her medications and assist with bathing. The patient does have a life alert watch. Education and support given.  Reviewed medications with patient and discussed compliance. The patients son states that he fixes her pills for her and he has cut black construction paper for each holder so the patient can see and make sure she gets all of her pills out. Discussed in the future if the patient would be interested in pill packs to let the [facility] know and pharmacy support can help with pill packaging system. Collaborated with Heidi Yoder  regarding patient with CCM order and transitioning over  to CCM services Provided patient with The 36 hour day educational materials related to changes in memory and what to expect from patients with memory decline  Reviewed scheduled/upcoming provider appointments including 02-25-2022 at 3 pm with pcp, review of next CCM follow up on 03-22-2022 at 1 pm Social Work referral for patients son has talked with LCSW for needs  Discussed plans with patient for ongoing care management follow up and provided patient with direct contact information for care management team Advised patient to discuss changes in memory, safety concerns, and new questions about health and well being of th patient with provider Screening for signs and symptoms of depression related to chronic disease state  Assessed social determinant of health barriers The patient has an order for CCM and will transition to CCM services. Education provided on the goals of the program and the support from the CCM team. The RNCM provided numbers to the patients son for the [facility] and RNCM designated to [facility]. Instructed the patients son to call in between outreaches for educational needs, questions, or concerns           SDOH assessments and interventions completed:  Yes  SDOH Interventions Today    Flowsheet Row Most Recent Value  SDOH Interventions   Utilities Interventions Intervention Not Indicated  Financial Strain Interventions Intervention Not Indicated        Care Coordination Interventions Activated:  Yes  Care Coordination Interventions:  Yes, provided   Follow up plan: Follow up  call scheduled for 03-22-2022 at 1 pm    Encounter Outcome:  Pt. Visit Completed  Noreene Larsson RN, MSN, [place]  Mobile: [phone]

## 2022-02-08 NOTE — Telephone Encounter (Signed)
Copied from [place] [id number]. Topic: Quick Communication - Home Health Verbal Orders >> Feb 08, 2022 11:58 AM Cyndi Bender wrote: Caller/Agency: Hershal Coria with [facility]  Callback Number: [phone] Requesting OT/PT/Skilled Nursing/Social Work/Speech Therapy: Speech Therapy Frequency: 1 x 6 weeks

## 2022-02-08 NOTE — Patient Instructions (Signed)
Visit Information  Thank you for taking time to visit with me today. Please don't hesitate to contact me if I can be of assistance to you.   Following are the goals we discussed today:   Goals Addressed             This Visit's Progress    RNCM: Effective Management of MCI and other chronic conditions       Care Coordination Interventions: Evaluation of current treatment plan related to MCI and other chronic conditions  and patient's adherence to plan as established by provider Advised patient to call the office for changes in conditions, questions, or concerns Provided education to patient re: monitoring for new safety concerns, changes in mood, anxiety, depression, rapid decline in the patients memory. Discussed safety and fall prevention The patients son states that PT has come out to the home for initial evaluation but he is waiting to hear from "Fayrene Fearing" the practitioner that will be working with the pt. The son denies any immediate safety concerns. The patient does live alone but has support from her sons and their spouses. They do not allow the patient to cook any longer and they check on her frequently. They manage her medications and assist with bathing. The patient does have a life alert watch. Education and support given.  Reviewed medications with patient and discussed compliance. The patients son states that he fixes her pills for her and he has cut black construction paper for each holder so the patient can see and make sure she gets all of her pills out. Discussed in the future if the patient would be interested in pill packs to let the [facility] know and pharmacy support can help with pill packaging system. Collaborated with Juanell Fairly  regarding patient with CCM order and transitioning over to CCM services Provided patient with The 36 hour day educational materials related to changes in memory and what to expect from patients with memory decline  Reviewed scheduled/upcoming  provider appointments including 02-25-2022 at 3 pm with pcp, review of next CCM follow up on 03-22-2022 at 1 pm Social Work referral for patients son has talked with LCSW for needs  Discussed plans with patient for ongoing care management follow up and provided patient with direct contact information for care management team Advised patient to discuss changes in memory, safety concerns, and new questions about health and well being of th patient with provider Screening for signs and symptoms of depression related to chronic disease state  Assessed social determinant of health barriers The patient has an order for CCM and will transition to CCM services. Education provided on the goals of the program and the support from the CCM team. The RNCM provided numbers to the patients son for the [facility] and RNCM designated to [facility]. Instructed the patients son to call in between outreaches for educational needs, questions, or concerns           Our next appointment is by telephone on 03-22-2022 at 1 pm  Please call the care guide team at [phone] if you need to cancel or reschedule your appointment.   If you are experiencing a Mental Health or Behavioral Health Crisis or need someone to talk to, please call the Suicide and Crisis Lifeline: 988 call the [place] National Suicide Prevention Lifeline: 6184403508 or TTY: 571-787-2936 TTY 754-575-4639) to talk to a trained counselor call 1-800-273-TALK (toll free, 24 hour hotline)  Patient verbalizes understanding of instructions and care plan provided today and agrees to  view in [place]. Active MyChart status and patient understanding of how to access instructions and care plan via MyChart confirmed with patient.     Telephone follow up appointment with care management team member scheduled for: 03-22-2022 at 1 pm  Mill[place], MSN, [place]  Mobile: [phone]

## 2022-02-08 NOTE — Telephone Encounter (Signed)
Ok for verbal orders.    Reality Dejonge Simmons-Robinson, MD  [facility]  

## 2022-02-16 ENCOUNTER — Telehealth: Payer: Self-pay | Admitting: Family Medicine

## 2022-02-16 NOTE — Telephone Encounter (Signed)
Home Health Verbal Orders - Caller/Agency: Clair Gulling with [place] Number: [phone] Requesting OT Frequency: 1 wk 5wks  He said he also needs an order for a 3 in 1 potty chair to be sent to [facility]. Please assist further

## 2022-02-17 NOTE — Telephone Encounter (Signed)
Verbals orders given to Heidi Yoder.  Order for the comode 3 in 1 was placed in [facility].

## 2022-02-21 ENCOUNTER — Ambulatory Visit (INDEPENDENT_AMBULATORY_CARE_PROVIDER_SITE_OTHER): Payer: Medicare Other

## 2022-02-21 DIAGNOSIS — I10 Essential (primary) hypertension: Secondary | ICD-10-CM

## 2022-02-21 DIAGNOSIS — G3184 Mild cognitive impairment, so stated: Secondary | ICD-10-CM

## 2022-02-22 DIAGNOSIS — I10 Essential (primary) hypertension: Secondary | ICD-10-CM

## 2022-02-25 ENCOUNTER — Encounter: Payer: Self-pay | Admitting: Family Medicine

## 2022-02-25 ENCOUNTER — Ambulatory Visit: Payer: Medicare Other | Admitting: Family Medicine

## 2022-02-25 VITALS — BP 132/74 | HR 68 | Resp 16 | Wt 134.0 lb

## 2022-02-25 DIAGNOSIS — G3184 Mild cognitive impairment, so stated: Secondary | ICD-10-CM | POA: Diagnosis not present

## 2022-02-25 DIAGNOSIS — R413 Other amnesia: Secondary | ICD-10-CM | POA: Diagnosis not present

## 2022-02-25 NOTE — Progress Notes (Signed)
Established patient visit   Patient: Heidi Yoder   DOB: 1930/03/17   88 y.o. Female  MRN: [id number] Visit Date: 02/25/2022  Today's healthcare provider: [name], MD   Chief Complaint  Patient presents with   Follow-up   Subjective    HPI  Follow up for MCI and Help with ADLS  The patient was last seen for this 1 months ago. Changes made at last visit include referral for chronic care management and personal care services.  Per patient's son Herbie [place] she is forgetting things more than last office visit.  Patient has had two sessions with pt.   Patient has forgotten plans for the day  He reports that her memory seems to have changed in that she repeats conversations, plans for outings, appointments more than she did  6 months ago  His main concern is keeping her safely in her home and wants to know when it would be appropriate to have family move   Medications: Outpatient Medications Prior to Visit  Medication Sig   CALCIUM-VITAMIN D PO Take 600 mg by mouth 2 (two) times daily.   Cyanocobalamin (VITAMIN B 12 PO) Take 1,000 mcg by mouth daily.   docusate sodium (COLACE) 100 MG capsule Take 200 mg by mouth daily.   donepezil (ARICEPT) 10 MG tablet TAKE 1 TABLET BY MOUTH EVERYDAY AT BEDTIME   hydrochlorothiazide (HYDRODIURIL) 25 MG tablet Take 1 tablet (25 mg total) by mouth daily.   Lactobacillus (ACIDOPHILUS) 100 MG CAPS Take 100 mg by mouth daily.   levothyroxine (SYNTHROID) 50 MCG tablet TAKE 1 TABLET BY MOUTH EVERY DAY   Multiple Vitamins-Minerals (MULTIVITAMIN WOMEN PO) Take 1 tablet by mouth daily.   Multiple Vitamins-Minerals (PRESERVISION AREDS 2 PO) Take 2 capsules by mouth daily.   omeprazole (PRILOSEC) 20 MG capsule TAKE 1 CAPSULE BY MOUTH EVERY DAY   simvastatin (ZOCOR) 20 MG tablet TAKE 1 TABLET BY MOUTH EVERY DAY   No facility-administered medications prior to visit.    Review of Systems     Objective    BP 132/74 (BP  Location: Left Arm, Patient Position: Sitting, Cuff Size: Normal)   Pulse 68   Resp 16   Wt 134 lb (60.8 kg)   BMI 26.17 kg/m    Physical Exam Vitals reviewed.  Constitutional:      General: She is not in acute distress.    Appearance: Normal appearance. She is not ill-appearing, toxic-appearing or diaphoretic.  Eyes:     Conjunctiva/sclera: Conjunctivae normal.  Cardiovascular:     Rate and Rhythm: Normal rate and regular rhythm.     Pulses: Normal pulses.     Heart sounds: Normal heart sounds. No murmur heard.    No friction rub. No gallop.  Pulmonary:     Effort: Pulmonary effort is normal. No respiratory distress.     Breath sounds: Normal breath sounds. No stridor. No wheezing, rhonchi or rales.  Musculoskeletal:     Right lower leg: No edema.     Left lower leg: No edema.  Skin:    Findings: No erythema or rash.  Neurological:     Mental Status: She is alert.     Cranial Nerves: No facial asymmetry.     Comments: Patient is able to tell the year, the day of the week  She is oriented to self and location   Patient repeated same question 6 times throughout the visit but was able to recall 1/3 items after five minutes  without prompting, she recalls the other two items with categorical clues   She can do serial 7s for 2 sets (up to 86)  She is unable to recall her dinner last night        No results found for any visits on 02/25/22.  Assessment & Plan     Problem List Items Addressed This Visit       Other   MCI (mild cognitive impairment)    Worsening  Patient demonstrates deficit in short term memory  Patient has family support with assisting with ADLs (bathing) and IADLs (finances, shopping)  With increased memory loss, patient's family would like to have formal neurological evaluation, referral submitted today  Will plan to follow up in February  She will continue Aricept 10mg        Other Visit Diagnoses     Memory changes    -  Primary    Relevant Orders   Ambulatory referral to Neurology        Return in about 3 months (around 05/28/2022) for memory .      I, 07/27/2022, MD, have reviewed all documentation for this visit. The documentation on 02/25/22 for the exam, diagnosis, procedures, and orders are all accurate and complete.  Portions of this information were initially documented by the CMA and reviewed by me for thoroughness and accuracy.      13/03/23, MD  [facility] [phone] (phone) [phone] (fax)  [facility]

## 2022-02-25 NOTE — Assessment & Plan Note (Signed)
Worsening  Patient demonstrates deficit in short term memory  Patient has family support with assisting with ADLs (bathing) and IADLs (finances, shopping)  With increased memory loss, patient's family would like to have formal neurological evaluation, referral submitted today  Will plan to follow up in February  She will continue Aricept 10mg 

## 2022-03-22 ENCOUNTER — Ambulatory Visit (INDEPENDENT_AMBULATORY_CARE_PROVIDER_SITE_OTHER): Payer: Medicare Other

## 2022-03-22 DIAGNOSIS — I10 Essential (primary) hypertension: Secondary | ICD-10-CM

## 2022-03-22 DIAGNOSIS — G3184 Mild cognitive impairment, so stated: Secondary | ICD-10-CM

## 2022-03-23 NOTE — Patient Instructions (Signed)
Thank you for allowing the Chronic Care Management team to participate in your care.    Following is a copy of your full provider care plan:   Goals Addressed             This Visit's Progress    Goal: CCM (Hypertension) Expected Outcome: Monitor, Self-Manage and Reduce Symptoms of Hypertension       Current Barriers:  Chronic Disease Management support and education needs related to Hypertension Cognitive Deficits  Planned Interventions: Reviewed plan for hypertension management with patient's caregiver/son Molly Maduro. Reviewed medications and indications for use.  Provided information regarding established blood pressure parameters along with indications for notifying a provider. Advised to monitor BP daily and record readings.  Discussed compliance with recommended cardiac prudent diet. Encouraged to read nutrition labels, monitor sodium intake, and avoid highly processed foods when possible. Discussed complications of uncontrolled blood pressure.  Reviewed s/sx of heart attack, stroke and worsening symptoms that require immediate medical attention.   Symptom Management: Take medications as prescribed   Attend all scheduled provider appointments Call pharmacy for medication refills 3-7 days in advance of running out of medications Call provider office for new concerns or questions  Check blood pressure daily (Family will assist) Keep a blood pressure log (Family will assist) Report new symptoms to your doctor Continue adherence to a heart healthy/cardiac prudent diet. Eat whole grains, fruits and vegetables, lean meats and healthy fats Limit salt intake    Follow Up Plan:   Will follow up next month       Goal: CCM (Mild Cognitive Impairment) Expected Outcome: Monitor, Self-Manage and Reduce Symptoms of Mild Cognitive Impairment       Current Barriers:  Chronic Disease Management support and education needs related to Mild Cognitive Impairment Cognitive Deficits  Planned  Interventions: Discussed plan related to cognitive impairment with patient's son Molly Maduro.  Son confirms that patient has very good family support. Currently receiving support with meals, grocery shopping, transportation, and medication management.  Discussed concerns r/t patient's need for assistance with ADL's. Son confirms that family is currently available to assist.  Agreed to update the team if additional in-home assistance is required.  Provided information related to safety measures and fall prevention. Reports patient is currently able to safely live at home alone. Uses a med alert system. Reviewed pending appointments. Patient will complete initial evaluation with the Neurology team on 03/16/22.   Symptom Management: Take medications as prescribed   Attend all scheduled provider appointments Call provider office for new concerns or questions  Follow recommended safety and fall prevention measures Work with the Neurologist, PCP and care management team to address concerns r/t cognitive impairment.  Follow Up Plan:  Will follow up next month          Patient's caregiver/son verbalizes understanding of instructions and care plan discussed during the telephonic outreach. Plan and instructions available to view in MyChart.   A member of the care management team will follow up next month.   Katha Cabal RN Care Manager/Chronic Care Management [phone]

## 2022-03-23 NOTE — Chronic Care Management (AMB) (Signed)
Chronic Care Management   CCM RN Visit Note   Name: Heidi Yoder MRN: [id number] DOB: February 11, 1930  Subjective: Heidi Yoder is a 88 y.o. year old female who is a primary care patient of Simmons-Robinson, Makiera, MD. The patient was referred to the Chronic Care Management team for assistance with care management needs subsequent to provider initiation of CCM services and plan of care.    Today's Visit:  Engaged with patient and caregiver son Molly Maduro by telephone for initial visit.     SDOH Interventions Today    Flowsheet Row Most Recent Value  SDOH Interventions   Food Insecurity Interventions Intervention Not Indicated  Housing Interventions Intervention Not Indicated  Transportation Interventions Intervention Not Indicated  Utilities Interventions Intervention Not Indicated  Alcohol Usage Interventions Intervention Not Indicated (Score <7)  Depression Interventions/Treatment  PHQ2-9 Score <4 Follow-up Not Indicated  Financial Strain Interventions Intervention Not Indicated  Stress Interventions Intervention Not Indicated  Social Connections Interventions Intervention Not Indicated  [Son will notify CCM team if patient agrees to participate in Community groups/senior  activities and resources are needed.]         Goals Addressed             This Visit's Progress    Goal: CCM (Hypertension) Expected Outcome: Monitor, Self-Manage and Reduce Symptoms of Hypertension       Current Barriers:  Chronic Disease Management support and education needs related to Hypertension Cognitive Deficits  Planned Interventions: Reviewed plan for hypertension management with patient's caregiver/son Molly Maduro. Reviewed medications and indications for use.  Provided information regarding established blood pressure parameters along with indications for notifying a provider. Advised to monitor BP daily and record readings.  Discussed compliance with recommended cardiac prudent diet.  Encouraged to read nutrition labels, monitor sodium intake, and avoid highly processed foods when possible. Discussed complications of uncontrolled blood pressure.  Reviewed s/sx of heart attack, stroke and worsening symptoms that require immediate medical attention.   Symptom Management: Take medications as prescribed   Attend all scheduled provider appointments Call pharmacy for medication refills 3-7 days in advance of running out of medications Call provider office for new concerns or questions  Check blood pressure daily (Family will assist) Keep a blood pressure log (Family will assist) Report new symptoms to your doctor Continue adherence to a heart healthy/cardiac prudent diet. Eat whole grains, fruits and vegetables, lean meats and healthy fats Limit salt intake    Follow Up Plan:   Will follow up next month       Goal: CCM (Mild Cognitive Impairment) Expected Outcome: Monitor, Self-Manage and Reduce Symptoms of Mild Cognitive Impairment       Current Barriers:  Chronic Disease Management support and education needs related to Mild Cognitive Impairment Cognitive Deficits  Planned Interventions: Discussed plan related to cognitive impairment with patient's son Molly Maduro.  Son confirms that patient has very good family support. Currently receiving support with meals, grocery shopping, transportation, and medication management.  Discussed concerns r/t patient's need for assistance with ADL's. Son confirms that family is currently available to assist.  Agreed to update the team if additional in-home assistance is required.  Provided information related to safety measures and fall prevention. Reports patient is currently able to safely live at home alone. Uses a med alert system. Reviewed pending appointments. Patient will complete initial evaluation with the Neurology team on 03/16/22.   Symptom Management: Take medications as prescribed   Attend all scheduled provider  appointments Call provider office for  new concerns or questions  Follow recommended safety and fall prevention measures Work with the Neurologist, PCP and care management team to address concerns r/t cognitive impairment.  Follow Up Plan:  Will follow up next month          PLAN A member of the care management team will follow up   Katha Cabal RN Care Manager/Chronic Care Management [phone]

## 2022-03-23 NOTE — Plan of Care (Signed)
Chronic Care Management Provider Comprehensive Care Plan     Name: BECKA LAGASSE MRN: [id number] DOB: 01/03/1930  Referral to Chronic Care Management (CCM) services was placed by Provider:  Ronnald Ramp, MD on Date: 01/25/22.  Chronic Condition 1: HTN Provider Assessment and Plan  Essential (primary) hypertension - CBC with Differential/Platelet - Comprehensive Metabolic Panel (CMET)   Expected Outcome/Goals Addressed This Visit (Provider CCM goals/Provider Assessment and plan  Goal: CCM (Hypertension) Expected Outcome: Monitor, Self-Manage and Reduce Symptoms of Hypertension   Symptom Management Condition 1: Take medications as prescribed . Attend all scheduled provider appointments Call pharmacy for medication refills 3-7 days in advance of running out of medications Call provider office for new concerns or questions  Check blood pressure daily (Family will assist) Keep a blood pressure log (Family will assist) Report new symptoms to your doctor. Continue adherence to a heart healthy/cardiac prudent diet. Eat whole grains, fruits and vegetables, lean meats and healthy fats Limit salt intake     Chronic Condition 2: Mild Cognitive Decline Provider Assessment and Plan  MCI (mild cognitive impairment) - Primary       Chronic This problem appears to be worsening We will follow-up on metabolic panel, TSH and CBC as recommended by patient's previous PCP We will also recommend patient for chronic care management being that she lives alone and family is requesting personal care services to help with bathing       Expected Outcome/Goals Addressed This Visit (Provider CCM goals/Provider Assessment and plan  Goal: CCM (Mild Cognitive Impairment) Expected Outcome: Monitor, Self-Manage and Reduce Symptoms of Mild Cognitive Impairment   Symptom Management Condition 2: Take medications as prescribed   Attend all scheduled provider appointments Call provider office  for new concerns or questions  Follow recommended safety and fall prevention measures Work with the Neurologist, PCP and care management team to address concerns r/t cognitive impairment.   Problem List Patient Active Problem List   Diagnosis Date Noted   AKI (acute kidney injury) (HCC) 01/25/2022   Urinary tract infection without hematuria 01/25/2022   MCI (mild cognitive impairment) 02/09/2021   Closed left hip fracture (HCC) 04/03/2015   Allergic rhinitis 02/06/2015   Back pain, chronic 02/06/2015   CAFL (chronic airflow limitation) (HCC) 02/06/2015   Essential (primary) hypertension 02/06/2015   GERD (gastroesophageal reflux disease) 02/06/2015   History of colon polyps 02/06/2015   Hypercholesteremia 02/06/2015   Hypothyroidism 02/06/2015   Arthritis, degenerative 02/06/2015   Neuropathy 02/06/2015   Osteopenia 02/06/2015   Post menopausal syndrome 02/06/2015   Scoliosis 02/06/2015   Female stress incontinence 02/06/2015   B12 deficiency 02/06/2015   Avitaminosis D 02/06/2015    Medication Management  Current Outpatient Medications:    CALCIUM-VITAMIN D PO, Take 600 mg by mouth 2 (two) times daily., Disp: , Rfl:    Cyanocobalamin (VITAMIN B 12 PO), Take 1,000 mcg by mouth daily., Disp: , Rfl:    docusate sodium (COLACE) 100 MG capsule, Take 200 mg by mouth daily., Disp: , Rfl:    donepezil (ARICEPT) 10 MG tablet, TAKE 1 TABLET BY MOUTH EVERYDAY AT BEDTIME, Disp: 90 tablet, Rfl: 1   hydrochlorothiazide (HYDRODIURIL) 25 MG tablet, Take 1 tablet (25 mg total) by mouth daily., Disp: 90 tablet, Rfl: 3   Lactobacillus (ACIDOPHILUS) 100 MG CAPS, Take 100 mg by mouth daily., Disp: , Rfl:    levothyroxine (SYNTHROID) 50 MCG tablet, TAKE 1 TABLET BY MOUTH EVERY DAY, Disp: 90 tablet, Rfl: 1   Multiple Vitamins-Minerals (  MULTIVITAMIN WOMEN PO), Take 1 tablet by mouth daily., Disp: , Rfl:    Multiple Vitamins-Minerals (PRESERVISION AREDS 2 PO), Take 2 capsules by mouth daily., Disp:  , Rfl:    omeprazole (PRILOSEC) 20 MG capsule, TAKE 1 CAPSULE BY MOUTH EVERY DAY, Disp: 90 capsule, Rfl: 1   simvastatin (ZOCOR) 20 MG tablet, TAKE 1 TABLET BY MOUTH EVERY DAY, Disp: 90 tablet, Rfl: 0  Cognitive Assessment Identity Confirmed: : Name; DOB Cognitive Status: Normal   Functional Assessment Hearing Difficulty or Deaf: no Wear Glasses or Blind: yes Vision Management: Wears prescription glasses Concentrating, Remembering or Making Decisions Difficulty (CP): yes Concentration Management: Memory decline Difficulty Communicating: no Difficulty Eating/Swallowing: no Walking or Climbing Stairs Difficulty: yes Walking or Climbing Stairs: ambulation difficulty, requires equipment Mobility Management: Uses walker Dressing/Bathing Difficulty: no Doing Errands Independently Difficulty (such as shopping) (CP): yes Errands Management: Son assist with errands and tasks Change in Functional Status Since Onset of Current Illness/Injury: yes (Currently receiving PT and OT services)   Caregiver Assessment  Primary Source of Support/Comfort: child(ren) Name of Support/Comfort Primary Source: Acupuncturist People in Home: alone Family Caregiver if Needed: child(ren), adult Family Caregiver Names: Jozey Janco   Planned Interventions  Hypertension Reviewed plan for hypertension management with patient's caregiver/son Molly Maduro. Reviewed medications and indications for use.  Provided information regarding established blood pressure parameters along with indications for notifying a provider. Advised to monitor BP daily and record readings.  Discussed compliance with recommended cardiac prudent diet. Encouraged to read nutrition labels, monitor sodium intake, and avoid highly processed foods when possible. Discussed complications of uncontrolled blood pressure.  Reviewed s/sx of heart attack, stroke and worsening symptoms that require immediate medical attention.  Mild Cognitive  Decline Discussed plan related to cognitive impairment with patient's son Molly Maduro.  Son confirms that patient has very good family support. Currently receiving support with meals, grocery shopping, transportation, and medication management.  Discussed concerns r/t patient's need for assistance with ADL's. Son confirms that family is currently available to assist.  Agreed to update the team if additional in-home assistance is required.  Provided information related to safety measures and fall prevention. Reports patient is currently able to safely live at home alone. Uses a med alert system. Reviewed pending appointments. Patient will complete initial evaluation with the Neurology team on 03/16/22.   Interaction and coordination with outside resources, practitioners, and providers See CCM Referral  Care Plan: Available in MyChart

## 2022-03-24 DIAGNOSIS — I10 Essential (primary) hypertension: Secondary | ICD-10-CM

## 2022-03-28 ENCOUNTER — Ambulatory Visit: Payer: Medicare Other | Admitting: Physician Assistant

## 2022-03-28 ENCOUNTER — Encounter: Payer: Self-pay | Admitting: Physician Assistant

## 2022-03-28 VITALS — BP 109/70 | HR 76 | Temp 98.7°F

## 2022-03-28 DIAGNOSIS — H5702 Anisocoria: Secondary | ICD-10-CM

## 2022-03-28 DIAGNOSIS — R051 Acute cough: Secondary | ICD-10-CM | POA: Diagnosis not present

## 2022-03-28 DIAGNOSIS — G3184 Mild cognitive impairment, so stated: Secondary | ICD-10-CM | POA: Diagnosis not present

## 2022-03-28 NOTE — Chronic Care Management (AMB) (Signed)
Chronic Care Management   CCM RN Visit Note   Name: Heidi Yoder MRN: [id number] DOB: 01-Jul-1929  Subjective: Heidi Yoder is a 88 y.o. year old female who is a primary care patient of Simmons-Robinson, Makiera, MD. The patient was referred to the Chronic Care Management team for assistance with care management needs subsequent to provider initiation of CCM services and plan of care.    Today's Visit:  Engaged with patient's caregiver/son by telephone for follow up visit.        Goals Addressed             This Visit's Progress    Goal: CCM (Hypertension) Expected Outcome: Monitor, Self-Manage and Reduce Symptoms of Hypertension       Current Barriers:  Chronic Disease Management support and education needs related to Hypertension Cognitive Deficits  Planned Interventions: Reviewed plan for hypertension management with patient's caregiver/son Herbie [place]. Reviewed medications and indications for use.  Provided information regarding established blood pressure parameters along with indications for notifying a provider. Advised to monitor BP daily and record readings.  Discussed compliance with recommended cardiac prudent diet. Encouraged to read nutrition labels, monitor sodium intake, and avoid highly processed foods when possible. Discussed complications of uncontrolled blood pressure.  Update 03/22/22: Plan for hypertension management reviewed. Son reports patient remains stable. Reports readings have been within range during home health therapy visits.  Reviewed s/sx of heart attack, stroke and worsening symptoms that require immediate medical attention.   Symptom Management: Take medications as prescribed   Attend all scheduled provider appointments Call pharmacy for medication refills 3-7 days in advance of running out of medications Call provider office for new concerns or questions  Check blood pressure daily (Family will assist) Keep a blood pressure log (Family  will assist) Report new symptoms to your doctor Continue adherence to a heart healthy/cardiac prudent diet. Eat whole grains, fruits and vegetables, lean meats and healthy fats Limit salt intake    Follow Up Plan:   Will follow up next month       Goal: CCM (Mild Cognitive Impairment) Expected Outcome: Monitor, Self-Manage and Reduce Symptoms of Mild Cognitive Impairment       Current Barriers:  Chronic Disease Management support and education needs related to Mild Cognitive Impairment Cognitive Deficits  Planned Interventions: Reviewed updates r/t cognitive impairment with patient's son Herbie [place].  Confirmed patient completed evaluation with the Neurology team on 03/16/22 as scheduled and started Namenda.  Reviewed mobility and plan for home therapy. Reports no falls since last outreach. She has successfully completed Occupational Therapy with goals met. Remains engaged with the Physical Therapy team. Patient continues to use walker with ambulation as recommended. Reviewed ADLs. Family remains very supportive with medication management, meals, transportation, and tasks in the home.  Son reports patient remains able to live alone safely in the home. Reports that she would likely be reluctant to in-home assistance via local agencies. Agreed to update the team if this changes, and assistance is required. Reviewed pending appointments. Patient will follow up with the Neurology team on 05/17/21.  Symptom Management: Take medications as prescribed   Attend all scheduled provider appointments Call provider office for new concerns or questions  Follow recommended safety and fall prevention measures Work with the Neurologist, PCP and care management team to address concerns r/t cognitive impairment.  Follow Up Plan:  Will follow up next month          PLAN: Will follow up next month   Evelia Waskey  [place] Manager/Chronic Care Management [phone]

## 2022-03-28 NOTE — Progress Notes (Unsigned)
I,Sha'taria Tyson,acting as a Education administrator for [employer], PA-C.,have documented all relevant documentation on the behalf of Mardene Speak, PA-C,as directed by  [employer], PA-C while in the presence of [employer], PA-C.   Established patient visit   Patient: Heidi Yoder   DOB: 1929-10-06   88 y.o. Female  MRN: [id number] Visit Date: 03/28/2022  Today's healthcare provider: Mardene Speak, PA-C   CC: cough  Subjective    Cough This is a new problem. The current episode started in the past 7 days. The problem has been gradually worsening. The problem occurs constantly. The cough is Non-productive. Associated symptoms include rhinorrhea and shortness of breath. She has tried nothing for the symptoms. The treatment provided no relief.    Medications: Outpatient Medications Prior to Visit  Medication Sig   CALCIUM-VITAMIN D PO Take 600 mg by mouth 2 (two) times daily.   Cyanocobalamin (VITAMIN B 12 PO) Take 1,000 mcg by mouth daily.   docusate sodium (COLACE) 100 MG capsule Take 200 mg by mouth daily.   donepezil (ARICEPT) 10 MG tablet TAKE 1 TABLET BY MOUTH EVERYDAY AT BEDTIME   hydrochlorothiazide (HYDRODIURIL) 25 MG tablet Take 1 tablet (25 mg total) by mouth daily.   Lactobacillus (ACIDOPHILUS) 100 MG CAPS Take 100 mg by mouth daily.   levothyroxine (SYNTHROID) 50 MCG tablet TAKE 1 TABLET BY MOUTH EVERY DAY   Multiple Vitamins-Minerals (MULTIVITAMIN WOMEN PO) Take 1 tablet by mouth daily.   Multiple Vitamins-Minerals (PRESERVISION AREDS 2 PO) Take 2 capsules by mouth daily.   omeprazole (PRILOSEC) 20 MG capsule TAKE 1 CAPSULE BY MOUTH EVERY DAY   simvastatin (ZOCOR) 20 MG tablet TAKE 1 TABLET BY MOUTH EVERY DAY   No facility-administered medications prior to visit.    Review of Systems  HENT:  Positive for rhinorrhea.   Respiratory:  Positive for cough and shortness of breath.     {Labs  Heme  Chem  Endocrine  Serology  Results Review (optional):23779}    Objective    There were no vitals taken for this visit. {Show previous vital signs (optional):23777}  Physical Exam Vitals reviewed.  Constitutional:      General: She is not in acute distress.    Appearance: Normal appearance. She is well-developed. She is not diaphoretic.  HENT:     Head: Normocephalic and atraumatic.     Right Ear: There is impacted cerumen.     Left Ear: There is impacted cerumen.  Eyes:     General: No scleral icterus.    Conjunctiva/sclera: Conjunctivae normal.  Neck:     Thyroid: No thyromegaly.  Cardiovascular:     Rate and Rhythm: Normal rate and regular rhythm.     Pulses: Normal pulses.     Heart sounds: Normal heart sounds. No murmur heard. Pulmonary:     Effort: Pulmonary effort is normal. No respiratory distress.     Breath sounds: Normal breath sounds. No wheezing, rhonchi or rales.  Abdominal:     General: Abdomen is flat. Bowel sounds are normal.     Palpations: Abdomen is soft.  Musculoskeletal:        General: Normal range of motion.     Cervical back: Normal range of motion and neck supple.     Right lower leg: No edema.     Left lower leg: No edema.  Lymphadenopathy:     Cervical: No cervical adenopathy.  Skin:    General: Skin is warm and dry.  Findings: No rash.  Neurological:     Mental Status: She is alert and oriented to person, place, and time. Mental status is at baseline.     Cranial Nerves: No cranial nerve deficit.     Sensory: No sensory deficit.     Motor: Weakness (unclear if it pt's normal) present.     Coordination: Coordination abnormal.     Gait: Gait abnormal (has problems with balance).  Psychiatric:        Mood and Affect: Mood normal.        Behavior: Behavior normal.        Thought Content: Thought content normal.        Judgment: Judgment normal.   Ambulates with a walker  No results found for any visits on 03/28/22.  Assessment & Plan     1. Anisocoria Pupil on the right uneven and  enlarged Pt's son was advised to see ophthalmology for evaluation Pt was scheduled for ophthalmology appt this Wednesday Pt was asked to send [place] the records from eye center. Will contact us if needed   2. MCI (mild cognitive impairment) On 02/25/22, MCI was worsening. Hx of cataract surgery. Normal neuro Referral to Neurology for formal neurological evaluation. Continue aricept 10mg  Pt was drowsy during the visit. Unclear if it is her normal state.  Pt was advised to do an urine sample but pt declined at this point. Worried if it could be UTI? Pt was advised to contact if symptoms worsen Will monitor  3. Acute cough X 7 days Normal vitals Pt was advised symptomatic treatment including Mucinex, drinking a lot of water, hot tea with honey, tenting, air humidifier, tylenol if needed Pt has 37.1 by [employer]. Planned to check in 2 days on her symptoms. RTC if symptoms persist   The patient was advised to call back or seek an in-person evaluation if the symptoms worsen or if the condition fails to improve as anticipated.  I discussed the assessment and treatment plan with the patient. The patient was provided an opportunity to ask questions and all were answered. The patient agreed with the plan and demonstrated an understanding of the instructions.  The entirety of the information documented in the History of Present Illness, Review of Systems and Physical Exam were personally obtained by me. Portions of this information were initially documented by the CMA and reviewed by me for thoroughness and accuracy.   [place], [facility], MMS [facility] [phone] (phone) [phone] (fax)

## 2022-03-28 NOTE — Patient Instructions (Signed)
Thank you for allowing the Chronic Care Management team to participate in your care.   Our next outreach is scheduled for 05/24/21 at 1 pm. Please do not hesitate to call if you require assistance prior to our next outreach. Please call the care guide team at [phone] if you need to cancel or reschedule your appointment.    Following is a copy of your full provider care plan:   Goals Addressed             This Visit's Progress    Goal: CCM (Hypertension) Expected Outcome: Monitor, Self-Manage and Reduce Symptoms of Hypertension       Current Barriers:  Chronic Disease Management support and education needs related to Hypertension Cognitive Deficits  Planned Interventions: Reviewed plan for hypertension management with patient's caregiver/son Herbie [place]. Reviewed medications and indications for use.  Provided information regarding established blood pressure parameters along with indications for notifying a provider. Advised to monitor BP daily and record readings.  Discussed compliance with recommended cardiac prudent diet. Encouraged to read nutrition labels, monitor sodium intake, and avoid highly processed foods when possible. Discussed complications of uncontrolled blood pressure.  Update 03/22/22: Plan for hypertension management reviewed. Son reports patient remains stable. Reports readings have been within range during home health therapy visits.  Reviewed s/sx of heart attack, stroke and worsening symptoms that require immediate medical attention.   Symptom Management: Take medications as prescribed   Attend all scheduled provider appointments Call pharmacy for medication refills 3-7 days in advance of running out of medications Call provider office for new concerns or questions  Check blood pressure daily (Family will assist) Keep a blood pressure log (Family will assist) Report new symptoms to your doctor Continue adherence to a heart healthy/cardiac prudent diet. Eat  whole grains, fruits and vegetables, lean meats and healthy fats Limit salt intake    Follow Up Plan:   Will follow up next month       Goal: CCM (Mild Cognitive Impairment) Expected Outcome: Monitor, Self-Manage and Reduce Symptoms of Mild Cognitive Impairment       Current Barriers:  Chronic Disease Management support and education needs related to Mild Cognitive Impairment Cognitive Deficits  Planned Interventions: Reviewed updates r/t cognitive impairment with patient's son Herbie [place].  Confirmed patient completed evaluation with the Neurology team on 03/16/22 as scheduled and started Namenda.  Reviewed mobility and plan for home therapy. Reports no falls since last outreach. She has successfully completed Occupational Therapy with goals met. Remains engaged with the Physical Therapy team. Patient continues to use walker with ambulation as recommended. Reviewed ADLs. Family remains very supportive with medication management, meals, transportation, and tasks in the home.  Son reports patient remains able to live alone safely in the home. Reports that she would likely be reluctant to in-home assistance via local agencies. Agreed to update the team if this changes, and assistance is required. Reviewed pending appointments. Patient will follow up with the Neurology team on 05/17/21.  Symptom Management: Take medications as prescribed   Attend all scheduled provider appointments Call provider office for new concerns or questions  Follow recommended safety and fall prevention measures Work with the Neurologist, PCP and care management team to address concerns r/t cognitive impairment.  Follow Up Plan:  Will follow up next month          Patient's caregiver/son verbalizes understanding of instructions and care plan provided today and agrees to view in [place].    A member of the care management team  will follow up next month.   Horris Latino RN Care Manager/Chronic Care  Management [phone]

## 2022-03-30 ENCOUNTER — Other Ambulatory Visit: Payer: Self-pay | Admitting: Physician Assistant

## 2022-03-30 DIAGNOSIS — K219 Gastro-esophageal reflux disease without esophagitis: Secondary | ICD-10-CM

## 2022-05-09 ENCOUNTER — Ambulatory Visit: Payer: Medicare Other | Admitting: Family Medicine

## 2022-05-20 ENCOUNTER — Other Ambulatory Visit: Payer: Self-pay | Admitting: Student

## 2022-05-20 DIAGNOSIS — R413 Other amnesia: Secondary | ICD-10-CM

## 2022-05-24 ENCOUNTER — Telehealth: Payer: Medicare Other

## 2022-05-24 ENCOUNTER — Ambulatory Visit (INDEPENDENT_AMBULATORY_CARE_PROVIDER_SITE_OTHER): Payer: Medicare Other

## 2022-05-24 DIAGNOSIS — G3184 Mild cognitive impairment, so stated: Secondary | ICD-10-CM

## 2022-05-24 DIAGNOSIS — I10 Essential (primary) hypertension: Secondary | ICD-10-CM

## 2022-05-25 DIAGNOSIS — G3184 Mild cognitive impairment, so stated: Secondary | ICD-10-CM

## 2022-05-25 DIAGNOSIS — I10 Essential (primary) hypertension: Secondary | ICD-10-CM

## 2022-05-27 NOTE — Chronic Care Management (AMB) (Signed)
Chronic Care Management   CCM RN Visit Note   Name: Heidi Yoder MRN: [id number] DOB: [birth year]  Subjective: Heidi Yoder is a 89 y.o. year old female who is a primary care patient of Simmons-Robinson, Makiera, MD. The patient was referred to the Chronic Care Management team for assistance with care management needs subsequent to provider initiation of CCM services and plan of care.    Today's Visit:  Engaged with patient by telephone for follow up visit.        Goals Addressed             This Visit's Progress    Goal: CCM (Hypertension) Expected Outcome: Monitor, Self-Manage and Reduce Symptoms of Hypertension       Current Barriers:  Chronic Disease Management support and education needs related to Hypertension Cognitive Deficits  Planned Interventions: Reviewed plan for hypertension management with patient's caregiver/son Herbie [place]. Reviewed medications and indications for use.  Provided information regarding established blood pressure parameters along with indications for notifying a provider. Advised to monitor BP daily and record readings.  Discussed compliance with recommended cardiac prudent diet. Encouraged to read nutrition labels, monitor sodium intake, and avoid highly processed foods when possible. Discussed complications of uncontrolled blood pressure.  05/24/22: No Changes  Symptom Management: Take medications as prescribed   Attend all scheduled provider appointments Call pharmacy for medication refills 3-7 days in advance of running out of medications Call provider office for new concerns or questions  Check blood pressure daily (Family will assist) Keep a blood pressure log (Family will assist) Report new symptoms to your doctor Continue adherence to a heart healthy/cardiac prudent diet. Eat whole grains, fruits and vegetables, lean meats and healthy fats Limit salt intake    Follow Up Plan:   Will follow up next month       Goal: CCM (Mild  Cognitive Impairment) Expected Outcome: Monitor, Self-Manage and Reduce Symptoms of Mild Cognitive Impairment       Current Barriers:  Chronic Disease Management support and education needs related to Mild Cognitive Impairment Cognitive Deficits  Planned Interventions: Reviewed current treatment plan for cognitive impairment. Conference call with patient's son's Legrand Como and Herbie [place] to discuss current treatment plan for cognitive impairment. Patient completed evaluation with the Neurology team on 05/17/22 as scheduled. Reviewed medication changes and referral for home aide services. Family confirmed that patient recently completed home health therapy with all goals met. Requested clarification regarding pending referral. Unclear if plan is to restart therapy with addition of home health aide or if plan is for personal services only. Reports patient's short -term memory and risks for falls is the primary concern. Denies recent changes or concerning behavioral changes.  Patient still requires assistance with ADLs d/t risk for falls. Continues to use walker with all ambulation. Currently, family is assisting with all in-home needs. Family remains very supportive with medication management, meals, transportation, and tasks in the home.  Will send additional information regarding [employer]. Discussed possible option for patient to use this benefit depending on her [facility] Medicare plan. Will collaborate with the Neurology team regarding pending referral for home aide.  Symptom Management: Take medications as prescribed   Attend all scheduled provider appointments Call provider office for new concerns or questions  Follow recommended safety and fall prevention measures Work with the Neurologist, PCP and care management team to address concerns r/t cognitive impairment.  Follow Up Plan:  Will follow up within the next two weeks  PLAN: A member of the care management team will follow  up within the next two weeks.   Horris Latino RN Care Manager/Chronic Care Management [phone]

## 2022-05-28 ENCOUNTER — Ambulatory Visit: Admission: RE | Admit: 2022-05-28 | Payer: Medicare Other | Source: Ambulatory Visit

## 2022-06-06 NOTE — Progress Notes (Signed)
I,Heidi Yoder,acting as a scribe for [employer], MD.,have documented all relevant documentation on the behalf of Heidi Foster, MD,as directed by  Heidi Foster, MD while in the presence of Heidi Foster, MD.   Established patient visit   Patient: Heidi Yoder   DOB: 1929/08/24   89 y.o. Female  MRN: [id number] Visit Date: 06/07/2022  Today's healthcare provider: [name], MD   Chief Complaint  Patient presents with   Follow-Up MCI   Subjective    HPI   Follow up for MCI  The patient was last seen for this 3 months ago. Changes made at last visit include continue Aricept 61m .  Patient was referred to the neurologist and has been seen by them twice.  Patient's grandson's wife, ACaryl Pina is present along with the patient's son, BMortimer Fries  Patient's son states she continues to ask repeated questions  They do not see that her memory has improved  Family has been taking care of medications for her because she was taking some medications twice   Family visits to make sure she has medications in mornings and evenings  Neuro has placed orders for her to have home aids Family members state that the patient is reluctant to have strangers in the home and they are a family would prefer to help her with needs Their ultimate goal is to keep the patient in her home as long as possible    Sons help with medications and sisters in law take care of the bathing as the patient is not allowed to bath on her own  Son states that he feels that the patient no longer has motivation to get up and move around the house  Patient will often sleep until 12:30PM unless someone wakes her up for breakfast and meds   Patient is often home alone throughout the day and overnight after having family visit throughout the day   -----------------------------------------------------------------------------------------    Medications: Outpatient Medications Prior to Visit  Medication Sig   CALCIUM-VITAMIN D PO Take 600 mg by mouth 2 (two) times daily.   Cyanocobalamin (VITAMIN B 12 PO) Take 1,000 mcg by mouth daily.   docusate sodium (COLACE) 100 MG capsule Take 200 mg by mouth daily.   donepezil (ARICEPT) 10 MG tablet TAKE 1 TABLET BY MOUTH EVERYDAY AT BEDTIME   Lactobacillus (ACIDOPHILUS) 100 MG CAPS Take 100 mg by mouth daily.   levothyroxine (SYNTHROID) 50 MCG tablet TAKE 1 TABLET BY MOUTH EVERY DAY   memantine (NAMENDA) 5 MG tablet Take 5 mg by mouth 2 (two) times daily.   Multiple Vitamins-Minerals (MULTIVITAMIN WOMEN PO) Take 1 tablet by mouth daily.   Multiple Vitamins-Minerals (PRESERVISION AREDS 2 PO) Take 2 capsules by mouth daily.   omeprazole (PRILOSEC) 20 MG capsule TAKE 1 CAPSULE BY MOUTH EVERY DAY   [DISCONTINUED] hydrochlorothiazide (HYDRODIURIL) 25 MG tablet Take 1 tablet (25 mg total) by mouth daily.   simvastatin (ZOCOR) 20 MG tablet TAKE 1 TABLET BY MOUTH EVERY DAY   No facility-administered medications prior to visit.    Review of Systems     Objective    BP 131/82 (BP Location: Left Arm, Patient Position: Sitting, Cuff Size: Normal)   Pulse 73   Temp (!) 97.3 F (36.3 C) (Oral)   Resp 16   Wt 134 lb 3.2 oz (60.9 kg)   BMI 26.21 kg/m    Physical Exam Vitals reviewed.  Constitutional:      General: She is not in  acute distress.    Appearance: Normal appearance. She is not ill-appearing, toxic-appearing or diaphoretic.  Eyes:     Conjunctiva/sclera: Conjunctivae normal.  Cardiovascular:     Rate and Rhythm: Normal rate and regular rhythm.     Pulses: Normal pulses.     Heart sounds:     No friction rub. No gallop.  Pulmonary:     Effort: Pulmonary effort is normal. No respiratory distress.     Breath sounds: Normal breath sounds. No stridor. No wheezing, rhonchi or rales.  Abdominal:     General: Bowel sounds are normal. There is no distension.     Palpations:  Abdomen is soft.     Tenderness: There is no abdominal tenderness.  Musculoskeletal:     Right lower leg: No edema.     Left lower leg: No edema.  Skin:    Findings: No erythema or rash.  Neurological:     Mental Status: She is alert and oriented to person, place, and time.       No results found for any visits on 06/07/22.  Assessment & Plan     Problem List Items Addressed This Visit       Other   MCI (mild cognitive impairment) - Primary    Chronic  Patient is medically stable  Family is concerned that memory appears to be worsening Patient has been evaluated by neurology and declines neuroimaging Patient has previously been referred to chronic care management and SW for help with resources for caregivers and memory changes  Offered to help with setting patient up for day program to increase her social activity, family declines Recommended family create schedule to visit with patient throughout the day and limit time that she is along and to help increase social activity Family and patient were in agreement with this plan Follow-up recommended in three months to address chronic issues         Return in about 3 months (around 09/05/2022), or if symptoms worsen or fail to improve, for chronic conditions .        The entirety of the information documented in the History of Present Illness, Review of Systems and Physical Exam were personally obtained by me. Portions of this information were initially documented by [name], CMA and reviewed by me for thoroughness and accuracy.Heidi Foster, MD    Heidi Foster, MD  [facility] [phone] (phone) [phone] (fax)  [place]

## 2022-06-07 ENCOUNTER — Ambulatory Visit: Payer: Medicare Other | Admitting: Family Medicine

## 2022-06-07 ENCOUNTER — Encounter: Payer: Self-pay | Admitting: Family Medicine

## 2022-06-07 VITALS — BP 131/82 | HR 73 | Temp 97.3°F | Resp 16 | Wt 134.2 lb

## 2022-06-07 DIAGNOSIS — G3184 Mild cognitive impairment, so stated: Secondary | ICD-10-CM | POA: Diagnosis not present

## 2022-06-08 ENCOUNTER — Other Ambulatory Visit: Payer: Self-pay | Admitting: Physician Assistant

## 2022-06-08 DIAGNOSIS — I10 Essential (primary) hypertension: Secondary | ICD-10-CM

## 2022-06-12 NOTE — Assessment & Plan Note (Addendum)
Chronic  Patient is medically stable  Family is concerned that memory appears to be worsening Patient has been evaluated by neurology and declines neuroimaging Patient has previously been referred to chronic care management and SW for help with resources for caregivers and memory changes  Offered to help with setting patient up for day program to increase her social activity, family declines Recommended family create schedule to visit with patient throughout the day and limit time that she is along and to help increase social activity Family and patient were in agreement with this plan Follow-up recommended in three months to address chronic issues

## 2022-06-20 ENCOUNTER — Telehealth: Payer: Self-pay | Admitting: Family Medicine

## 2022-06-20 NOTE — Telephone Encounter (Signed)
Contacted Heidi Yoder to schedule their annual wellness visit. Patient declined to schedule AWV at this time.Transferred care to Dr. Alben Spittle new office.   [place] Direct Dial: [phone]

## 2022-08-10 ENCOUNTER — Other Ambulatory Visit: Payer: Self-pay | Admitting: Family Medicine

## 2022-08-10 DIAGNOSIS — G3184 Mild cognitive impairment, so stated: Secondary | ICD-10-CM

## 2022-08-24 ENCOUNTER — Ambulatory Visit: Payer: Medicare Other | Admitting: Family Medicine

## 2022-09-22 ENCOUNTER — Other Ambulatory Visit: Payer: Self-pay | Admitting: Physician Assistant

## 2022-09-22 DIAGNOSIS — K219 Gastro-esophageal reflux disease without esophagitis: Secondary | ICD-10-CM

## 2023-04-01 ENCOUNTER — Emergency Department: Payer: Medicare Other

## 2023-04-01 ENCOUNTER — Other Ambulatory Visit: Payer: Self-pay

## 2023-04-01 ENCOUNTER — Observation Stay (HOSPITAL_BASED_OUTPATIENT_CLINIC_OR_DEPARTMENT_OTHER)
Admit: 2023-04-01 | Discharge: 2023-04-01 | Disposition: A | Discharge status: Home / Self Care | Payer: Medicare Other | Attending: Internal Medicine | Admitting: Internal Medicine

## 2023-04-01 ENCOUNTER — Observation Stay
Admission: EM | Admit: 2023-04-01 | Discharge: 2023-04-03 | Disposition: A | Discharge status: Home Health | Payer: Medicare Other | Attending: Internal Medicine | Admitting: Internal Medicine

## 2023-04-01 DIAGNOSIS — R55 Syncope and collapse: Principal | ICD-10-CM | POA: Insufficient documentation

## 2023-04-01 DIAGNOSIS — F02811 Dementia in other diseases classified elsewhere, unspecified severity, with agitation: Secondary | ICD-10-CM | POA: Insufficient documentation

## 2023-04-01 DIAGNOSIS — I10 Essential (primary) hypertension: Secondary | ICD-10-CM | POA: Diagnosis not present

## 2023-04-01 DIAGNOSIS — E785 Hyperlipidemia, unspecified: Secondary | ICD-10-CM | POA: Insufficient documentation

## 2023-04-01 DIAGNOSIS — E860 Dehydration: Secondary | ICD-10-CM | POA: Insufficient documentation

## 2023-04-01 DIAGNOSIS — G309 Alzheimer's disease, unspecified: Secondary | ICD-10-CM | POA: Diagnosis not present

## 2023-04-01 DIAGNOSIS — E039 Hypothyroidism, unspecified: Secondary | ICD-10-CM | POA: Diagnosis not present

## 2023-04-01 DIAGNOSIS — F0280 Dementia in other diseases classified elsewhere without behavioral disturbance: Secondary | ICD-10-CM

## 2023-04-01 DIAGNOSIS — M6281 Muscle weakness (generalized): Secondary | ICD-10-CM | POA: Insufficient documentation

## 2023-04-01 DIAGNOSIS — N1831 Chronic kidney disease, stage 3a: Secondary | ICD-10-CM | POA: Insufficient documentation

## 2023-04-01 DIAGNOSIS — R4182 Altered mental status, unspecified: Principal | ICD-10-CM | POA: Insufficient documentation

## 2023-04-01 DIAGNOSIS — R2689 Other abnormalities of gait and mobility: Secondary | ICD-10-CM | POA: Insufficient documentation

## 2023-04-01 DIAGNOSIS — I129 Hypertensive chronic kidney disease with stage 1 through stage 4 chronic kidney disease, or unspecified chronic kidney disease: Secondary | ICD-10-CM | POA: Insufficient documentation

## 2023-04-01 DIAGNOSIS — R2681 Unsteadiness on feet: Secondary | ICD-10-CM | POA: Insufficient documentation

## 2023-04-01 LAB — CBC
HCT: 38 % (ref 36.0–46.0)
Hemoglobin: 12.7 g/dL (ref 12.0–15.0)
MCH: 31.9 pg (ref 26.0–34.0)
MCHC: 33.4 g/dL (ref 30.0–36.0)
MCV: 95.5 fL (ref 80.0–100.0)
Platelets: 279 10*3/uL (ref 150–400)
RBC: 3.98 MIL/uL (ref 3.87–5.11)
RDW: 12.7 % (ref 11.5–15.5)
WBC: 8.7 10*3/uL (ref 4.0–10.5)
nRBC: 0 % (ref 0.0–0.2)

## 2023-04-01 LAB — URINALYSIS, ROUTINE W REFLEX MICROSCOPIC
Bilirubin Urine: NEGATIVE
Glucose, UA: NEGATIVE mg/dL
Hgb urine dipstick: NEGATIVE
Ketones, ur: 5 mg/dL — AB
Leukocytes,Ua: NEGATIVE
Nitrite: NEGATIVE
Protein, ur: NEGATIVE mg/dL
Specific Gravity, Urine: 1.018 (ref 1.005–1.030)
pH: 6 (ref 5.0–8.0)

## 2023-04-01 LAB — BASIC METABOLIC PANEL
Anion gap: 12 (ref 5–15)
BUN: 26 mg/dL — ABNORMAL HIGH (ref 8–23)
CO2: 25 mmol/L (ref 22–32)
Calcium: 9.3 mg/dL (ref 8.9–10.3)
Chloride: 101 mmol/L (ref 98–111)
Creatinine, Ser: 1.3 mg/dL — ABNORMAL HIGH (ref 0.44–1.00)
GFR, Estimated: 39 mL/min — ABNORMAL LOW (ref >60)
Glucose, Bld: 107 mg/dL — ABNORMAL HIGH (ref 70–99)
Potassium: 3.6 mmol/L (ref 3.5–5.1)
Sodium: 138 mmol/L (ref 135–145)

## 2023-04-01 LAB — TSH: TSH: 1.973 u[IU]/mL (ref 0.350–4.500)

## 2023-04-01 MED ORDER — ONDANSETRON HCL 4 MG PO TABS: 4.0000 mg | ORAL_TABLET | Freq: Four times a day (QID) | ORAL | Status: DC | PRN

## 2023-04-01 MED ORDER — ENOXAPARIN SODIUM 30 MG/0.3ML IJ SOSY
30.0000 mg | PREFILLED_SYRINGE | INTRAMUSCULAR | Status: DC
  Administered 2023-04-01 – 2023-04-02 (×2): 30 mg via SUBCUTANEOUS
  Filled 2023-04-01 (×2): qty 0.3

## 2023-04-01 MED ORDER — ACETAMINOPHEN 325 MG PO TABS: 650.0000 mg | ORAL_TABLET | Freq: Four times a day (QID) | ORAL | Status: DC | PRN

## 2023-04-01 MED ORDER — POLYETHYLENE GLYCOL 3350 17 G PO PACK: 17.0000 g | PACK | Freq: Every day | ORAL | Status: DC | PRN

## 2023-04-01 MED ORDER — ACETAMINOPHEN 650 MG RE SUPP: 650.0000 mg | Freq: Four times a day (QID) | RECTAL | Status: DC | PRN

## 2023-04-01 MED ORDER — MEMANTINE HCL 5 MG PO TABS
5.0000 mg | ORAL_TABLET | Freq: Two times a day (BID) | ORAL | Status: DC
  Administered 2023-04-01 – 2023-04-03 (×4): 5 mg via ORAL
  Filled 2023-04-01 (×4): qty 1

## 2023-04-01 MED ORDER — HYDROCHLOROTHIAZIDE 25 MG PO TABS
25.0000 mg | ORAL_TABLET | Freq: Every day | ORAL | Status: DC
  Administered 2023-04-02 – 2023-04-03 (×2): 25 mg via ORAL
  Filled 2023-04-01 (×2): qty 1

## 2023-04-01 MED ORDER — SODIUM CHLORIDE 0.9% FLUSH
3.0000 mL | Freq: Two times a day (BID) | INTRAVENOUS | Status: DC
  Administered 2023-04-01 – 2023-04-03 (×2): 3 mL via INTRAVENOUS

## 2023-04-01 MED ORDER — LEVOTHYROXINE SODIUM 50 MCG PO TABS
50.0000 ug | ORAL_TABLET | Freq: Every day | ORAL | Status: DC
  Administered 2023-04-02 – 2023-04-03 (×2): 50 ug via ORAL
  Filled 2023-04-01 (×2): qty 1

## 2023-04-01 MED ORDER — ONDANSETRON HCL 4 MG/2ML IJ SOLN: 4.0000 mg | Freq: Four times a day (QID) | INTRAMUSCULAR | Status: DC | PRN

## 2023-04-01 NOTE — Assessment & Plan Note (Addendum)
History of Alzheimer's dementia with no reported behavioral changes.  - Holding home donepezil due to bradycardia - Continue home memantine - Delirium precautions

## 2023-04-01 NOTE — ED Triage Notes (Signed)
Pt in via [facility] from home with c/o near syncope. Pt was sitting at table getting ready to eat, turned pale and diaphoretic and slumped over laying her head on the table. Pt did not pass out and was responsive to family. Pt did not want to come but family wanted her checked out. CBG 158, 160/70, 58 HR, 94% RA, #22 left hand

## 2023-04-01 NOTE — Progress Notes (Signed)
  Echocardiogram 2D Echocardiogram has been performed.  Heidi Yoder C Ahava Kissoon 04/01/2023, 10:10 PM

## 2023-04-01 NOTE — Assessment & Plan Note (Signed)
 Resume home HCTZ

## 2023-04-01 NOTE — ED Provider Notes (Signed)
[facility] Provider Note    Event Date/Time   First MD Initiated Contact with Patient 04/01/23 1207     (approximate)   History   Near Syncope   HPI  Heidi Yoder is a 89 y.o. female with a history of Alzheimer's dementia, hypertension, and hypothyroidism who presents with a "spell" of altered mental status, acute onset shortly prior to coming in.  The son states that the patient was sitting at the table about to eat breakfast when she started shaking initially in her arms and then her upper body, then she slumped forward and lost consciousness.  This lasted for couple minutes.  After few minutes she grunted once or twice and then started to wake up.  She is now back to her baseline mental status.  She has not had any similar episodes.  The patient does not remember anything about the episode although she is aware of where she is.  She denies any pain, dizziness, or other acute complaints.  I reviewed the past medical records per the patient's most recent outpatient encounter was on 11/13 with family medicine for evaluation of hip and low back pain.  Physical Exam   Triage Vital Signs: ED Triage Vitals  Encounter Vitals Group     BP 04/01/23 1119 129/77     Systolic BP Percentile --      Diastolic BP Percentile --      Pulse Rate 04/01/23 1119 (!) 53     Resp 04/01/23 1119 20     Temp 04/01/23 1119 (!) 97.4 F (36.3 C)     Temp Source 04/01/23 1119 Oral     SpO2 04/01/23 1119 100 %     Weight 04/01/23 1121 160 lb (72.6 kg)     Height 04/01/23 1121 5\' 3"  (1.6 m)     Head Circumference --      Peak Flow --      Pain Score 04/01/23 1121 0     Pain Loc --      Pain Education --      Exclude from Growth Chart --     Most recent vital signs: Vitals:   04/01/23 1855 04/01/23 1905  BP:    Pulse: 72 (!) 58  Resp: 18 17  Temp:    SpO2: 97% 97%     General: Alert, oriented x 2, no distress.  CV:  Good peripheral perfusion.  Resp:  Normal  effort.  Abd:  No distention.  Other:  EOMI.  Left pupil round and reactive, right pupil appears irregular and post surgical.  No facial droop.  Normal speech.  No ataxia on finger-to-nose.  Motor intact in all extremities.  No pronator drift.   ED Results / Procedures / Treatments   Labs (all labs ordered are listed, but only abnormal results are displayed) Labs Reviewed  BASIC METABOLIC PANEL - Abnormal; Notable for the following components:      Result Value   Glucose, Bld 107 (*)    BUN 26 (*)    Creatinine, Ser 1.30 (*)    GFR, Estimated 39 (*)    All other components within normal limits  URINALYSIS, ROUTINE W REFLEX MICROSCOPIC - Abnormal; Notable for the following components:   Color, Urine YELLOW (*)    APPearance HAZY (*)    Ketones, ur 5 (*)    All other components within normal limits  CBC  BASIC METABOLIC PANEL  CBC  TSH     EKG  ED ECG REPORT I, Dionne Bucy, the attending physician, personally viewed and interpreted this ECG.  Date: 04/01/2023 EKG Time: 1125 Rate: 58 Rhythm: normal sinus rhythm QRS Axis: normal Intervals: normal ST/T Wave abnormalities: Nonspecific T wave abnormalities Narrative Interpretation: no evidence of acute ischemia; no significant change when compared to EKG of 01/17/2022    RADIOLOGY  CT head: I independently viewed and interpreted the images; there is no ICH.  Radiology report indicates no acute abnormality.   PROCEDURES:  Critical Care performed: No  Procedures   MEDICATIONS ORDERED IN ED: Medications  sodium chloride flush (NS) 0.9 % injection 3 mL (has no administration in time range)  enoxaparin (LOVENOX) injection 30 mg (has no administration in time range)  acetaminophen (TYLENOL) tablet 650 mg (has no administration in time range)    Or  acetaminophen (TYLENOL) suppository 650 mg (has no administration in time range)  polyethylene glycol (MIRALAX / GLYCOLAX) packet 17 g (has no administration in  time range)  ondansetron (ZOFRAN) tablet 4 mg (has no administration in time range)    Or  ondansetron (ZOFRAN) injection 4 mg (has no administration in time range)  memantine (NAMENDA) tablet 5 mg (has no administration in time range)  hydrochlorothiazide (HYDRODIURIL) tablet 25 mg (has no administration in time range)  levothyroxine (SYNTHROID) tablet 50 mcg (has no administration in time range)     IMPRESSION / MDM / ASSESSMENT AND PLAN / ED COURSE  I reviewed the triage vital signs and the nursing notes.  89 year old female with PMH as noted above presents with an episode of altered mental status while sitting down to eat, now resolved.  Currently her vital signs are normal, physical exam is otherwise unremarkable.  Neurologic exam is nonfocal.  Differential diagnosis includes, but is not limited to, vasovagal syncope, cardiac dysrhythmia or other syncope, dehydration, electrolyte abnormality, other metabolic etiology, UTI or other infection, less likely TIA or other CNS cause.  Although the son reports some shaking based on his description it does not seem consistent with seizure activity.  We will obtain CT head, basic labs, urinalysis, and reassess.  Patient's presentation is most consistent with acute presentation with potential threat to life or bodily function.  ----------------------------------------- 5:30 PM on 04/01/2023 -----------------------------------------  Lab workup is unremarkable.  BMP and CBC are within normal limits except for mildly elevated creatinine which appears to be baseline for the patient.  Urinalysis shows no evidence of UTI.  CT head is negative.  I had an extensive discussion with the patient, her son, and her POA over the phone.  Although the workup is negative, given the patient's age and the unprovoked syncope, the patient would benefit from admission for further monitoring, and they agree.  I consulted Dr. Huel Cote from the hospitalist service; based  on our discussion she agrees to evaluate the patient for admission.   FINAL CLINICAL IMPRESSION(S) / ED DIAGNOSES   Final diagnoses:  Altered mental status, unspecified altered mental status type  Syncope, unspecified syncope type     Rx / DC Orders   ED Discharge Orders     None        Note:  This document was prepared using Dragon voice recognition software and may include unintentional dictation errors.    Dionne Bucy, MD 04/01/23 [phone]

## 2023-04-01 NOTE — Assessment & Plan Note (Signed)
-   TSH pending - Resume home Synthroid

## 2023-04-01 NOTE — H&P (Addendum)
History and Physical    Patient: Heidi Yoder ZOX:[id number] DOB: 1929-12-02 DOA: 04/01/2023 DOS: the patient was seen and examined on 04/01/2023 PCP: Bosie Clos, MD  Patient coming from: Home  Chief Complaint:  Chief Complaint  Patient presents with   Near Syncope   HPI: Heidi Yoder is a 89 y.o. female with medical history significant of Alzheimer's dementia, hypertension, hyperlipidemia, hypothyroidism, CKD who presents to the ED due to syncope.  History obtained from chart review as patient does not recall what brought her in today.  At this time, she states that she feels well and is not experiencing any dizziness, headache, nausea, vomiting, diarrhea, abdominal pain, chest pain, palpitations.  Per chart review, patient was sitting at the table with her son eating breakfast when she began to have shaking in her arms that were similar to her tremor.  During this time, she was pale and diaphoretic.  Afterwards she slumped forward and potentially lost consciousness.  After a couple minutes, she grunted and began to awake before shortly returning to her baseline.  No prior history of similar.  ED course: On arrival to the ED, patient was normotensive at 129/77 with heart rate of 53.  She was saturating at 100% on room air.  She was afebrile at 97.4.  Initial workup notable for normal CBC, glucose of 107, BUN 26, creatinine 1.30 with GFR of 39.  Urinalysis with ketonuria only.  CT head with no acute findings.  TRH contacted for admission.  Review of Systems: As mentioned in the history of present illness. All other systems reviewed and are negative.  Past Medical History:  Diagnosis Date   GERD (gastroesophageal reflux disease)    Hypertension    Hypothyroidism    MCI (mild cognitive impairment) 02/09/2021   Peripheral neuropathy    Past Surgical History:  Procedure Laterality Date   ABDOMINAL HYSTERECTOMY     due to cervical hyperlasia   BACK SURGERY     spinal  stenosis and back surgery   CATARACT EXTRACTION Bilateral    HIP PINNING,CANNULATED Left 04/04/2015   Procedure: CANNULATED HIP PINNING;  Surgeon: Deeann Saint, MD;  Location: [facility] ORS;  Service: Orthopedics;  Laterality: Left;   UPPER GI ENDOSCOPY  10/17/03   normal duodenum, normal esophagus, normal stomach   Social History:  reports that she has never smoked. She has never used smokeless tobacco. She reports that she does not drink alcohol and does not use drugs.  No Known Allergies  Family History  Problem Relation Age of Onset   Ovarian cancer Mother    Hypertension Sister    Colon polyps Sister    Cancer Sister        breast   Heart disease Brother    Colon polyps Brother    Dementia Brother    CVA Father     Prior to Admission medications   Medication Sig Start Date End Date Taking? Authorizing Provider  CALCIUM-VITAMIN D PO Take 600 mg by mouth 2 (two) times daily. 03/21/11   [provider]  Cyanocobalamin (VITAMIN B 12 PO) Take 1,000 mcg by mouth daily.    [provider]  docusate sodium (COLACE) 100 MG capsule Take 200 mg by mouth daily.    [provider]  donepezil (ARICEPT) 10 MG tablet TAKE 1 TABLET BY MOUTH EVERYDAY AT BEDTIME 02/07/22   Simmons-Robinson, Makiera, MD  hydrochlorothiazide (HYDRODIURIL) 25 MG tablet TAKE 1 TABLET (25 MG TOTAL) BY MOUTH DAILY. 06/08/22  Simmons-Robinson, Makiera, MD  Lactobacillus (ACIDOPHILUS) 100 MG CAPS Take 100 mg by mouth daily.    [provider]  levothyroxine (SYNTHROID) 50 MCG tablet TAKE 1 TABLET BY MOUTH EVERY DAY 12/09/21   Bosie Clos, MD  memantine (NAMENDA) 5 MG tablet Take 5 mg by mouth 2 (two) times daily.    [provider]  Multiple Vitamins-Minerals (MULTIVITAMIN WOMEN PO) Take 1 tablet by mouth daily.    [provider]  Multiple Vitamins-Minerals (PRESERVISION AREDS 2 PO) Take 2 capsules by mouth daily.    [provider]  omeprazole (PRILOSEC)  20 MG capsule TAKE 1 CAPSULE BY MOUTH EVERY DAY 03/30/22   Ok Edwards, Lillia Abed, PA-C  simvastatin (ZOCOR) 20 MG tablet TAKE 1 TABLET BY MOUTH EVERY DAY 12/24/21   Bosie Clos, MD    Physical Exam: Vitals:   04/01/23 1119 04/01/23 1121 04/01/23 1649  BP: 129/77  (!) 149/69  Pulse: (!) 53  (!) 59  Resp: 20  18  Temp: (!) 97.4 F (36.3 C)  (!) 97.4 F (36.3 C)  TempSrc: Oral  Oral  SpO2: 100%  98%  Weight:  72.6 kg   Height:  5\' 3"  (1.6 m)    Physical Exam Vitals and nursing note reviewed.  Constitutional:      General: She is not in acute distress.    Appearance: She is normal weight.  HENT:     Head: Normocephalic and atraumatic.     Mouth/Throat:     Mouth: Mucous membranes are moist.     Pharynx: Oropharynx is clear.  Eyes:     Extraocular Movements: Extraocular movements intact.     Conjunctiva/sclera: Conjunctivae normal.     Comments: Post-surgical pupillary changes of the right eye.   Cardiovascular:     Rate and Rhythm: Regular rhythm. Bradycardia present.     Heart sounds: No murmur heard. Pulmonary:     Effort: Pulmonary effort is normal. No respiratory distress.     Breath sounds: Normal breath sounds. No wheezing, rhonchi or rales.  Abdominal:     General: There is no distension.     Palpations: Abdomen is soft.     Tenderness: There is no abdominal tenderness. There is no guarding.  Musculoskeletal:     Right lower leg: No edema.     Left lower leg: No edema.  Skin:    General: Skin is warm and dry.  Neurological:     Mental Status: She is alert.     Comments:  Patient is alert and oriented to person, place, and month but not situation No facial asymmetry or dysarthria No focal weakness No tremor Sensation grossly intact  Psychiatric:        Mood and Affect: Mood normal.        Behavior: Behavior normal.    Data Reviewed: CBC with WBC of 8.7, hemoglobin of 12.7, platelets of 279 BMP with sodium of 138, potassium 3.6, bicarb 25, glucose 107, BUN  26, creatinine 0.30 with GFR 39 Urinalysis ketonuria only  EKG personally reviewed.  Sinus rhythm with bradycardia, rate of 58.  No AV block.  No ischemic appearing changes  CT Head Wo Contrast  Result Date: 04/01/2023 CLINICAL DATA:  Mental status changes EXAM: CT HEAD WITHOUT CONTRAST TECHNIQUE: Contiguous axial images were obtained from the base of the skull through the vertex without intravenous contrast. RADIATION DOSE REDUCTION: This exam was performed according to the departmental dose-optimization program which includes automated exposure control, adjustment of the  mA and/or kV according to patient size and/or use of iterative reconstruction technique. COMPARISON:  01/17/2022 FINDINGS: Brain: Marked low density in the periventricular white matter likely related to small vessel disease. Marked cerebral and cerebellar atrophy with resultant prominence of the extra-axial spaces. No mass lesion, hemorrhage, hydrocephalus, acute infarct, intra-axial, or extra-axial fluid collection. Vascular: No hyperdense vessel or unexpected calcification. Intracranial atherosclerosis. Skull: Normal Sinuses/Orbits: Normal imaged portions of the orbits and globes. Right maxillary sinus mucosal thickening. Fluid in the sphenoid sinus. Clear mastoid air cells. Other: None. IMPRESSION: 1.  No acute intracranial abnormality. 2.  Cerebral/cerebellar atrophy and small vessel ischemic change. 3. Mild sinus disease. Electronically Signed   By: Jeronimo Greaves M.D.   On: 04/01/2023 13:22    Results are pending, will review when available.  Assessment and Plan:  * Syncope Syncopal episode today while seated prior to eating breakfast.  Shaking/tremors before syncopal episode, however patient quickly returned to baseline, which argues against seizure.  Sinus bradycardia noted, which appears new for the patient.  Differential also includes vasovagal vs poor PO intake (given ketonuria).  - Telemetry monitoring - Echocardiogram  ordered - Hold home donepezil - Orthostatic vital signs  Alzheimer's dementia (HCC) History of Alzheimer's dementia with no reported behavioral changes.  - Holding home donepezil due to bradycardia - Continue home memantine - Delirium precautions  Stage 3a chronic kidney disease (CKD) (HCC) Per chart review, baseline creatinine ranging between 1.0 and 1.4.  Currently within baseline range.  Hypothyroidism - TSH pending - Resume home Synthroid  Essential (primary) hypertension - Resume home HCTZ  Advance Care Planning:   Code Status: Limited: Do not attempt resuscitation (DNR) -DNR-LIMITED -Do Not Intubate/DNI per signed Gold form at bedside  Consults: None  Family Communication: No family at bedside  Severity of Illness: The appropriate patient status for this patient is OBSERVATION. Observation status is judged to be reasonable and necessary in order to provide the required intensity of service to ensure the patient's safety. The patient's presenting symptoms, physical exam findings, and initial radiographic and laboratory data in the context of their medical condition is felt to place them at decreased risk for further clinical deterioration. Furthermore, it is anticipated that the patient will be medically stable for discharge from the hospital within 2 midnights of admission.   Author: Verdene Lennert, MD 04/01/2023 6:23 PM  For on call review [email or web address].

## 2023-04-01 NOTE — ED Triage Notes (Signed)
See first nurse note. Pt reports does not know why she is here, denies complaints.

## 2023-04-01 NOTE — Assessment & Plan Note (Signed)
Per chart review, baseline creatinine ranging between 1.0 and 1.4.  Currently within baseline range.

## 2023-04-01 NOTE — ED Notes (Signed)
Patient attempted to urinate in bedpan at this time. Patient was not able to urinate. This RN notified family to let this RN know if patient feels the urge to pee. Patient's bed returned to lowest position with call light in reach.

## 2023-04-01 NOTE — Assessment & Plan Note (Addendum)
Syncopal episode today while seated prior to eating breakfast.  Shaking/tremors before syncopal episode, however patient quickly returned to baseline, which argues against seizure.  Sinus bradycardia noted, which appears new for the patient.  Differential also includes vasovagal vs poor PO intake (given ketonuria).  - Telemetry monitoring - Echocardiogram ordered - Hold home donepezil - Orthostatic vital signs

## 2023-04-02 DIAGNOSIS — R4182 Altered mental status, unspecified: Secondary | ICD-10-CM

## 2023-04-02 DIAGNOSIS — R55 Syncope and collapse: Secondary | ICD-10-CM | POA: Diagnosis not present

## 2023-04-02 LAB — CBC
HCT: 33.9 % — ABNORMAL LOW (ref 36.0–46.0)
Hemoglobin: 11.7 g/dL — ABNORMAL LOW (ref 12.0–15.0)
MCH: 32.3 pg (ref 26.0–34.0)
MCHC: 34.5 g/dL (ref 30.0–36.0)
MCV: 93.6 fL (ref 80.0–100.0)
Platelets: 261 10*3/uL (ref 150–400)
RBC: 3.62 MIL/uL — ABNORMAL LOW (ref 3.87–5.11)
RDW: 12.8 % (ref 11.5–15.5)
WBC: 9.4 10*3/uL (ref 4.0–10.5)
nRBC: 0 % (ref 0.0–0.2)

## 2023-04-02 LAB — ECHOCARDIOGRAM COMPLETE
AR max vel: 2.21 cm2
AV Area VTI: 2.62 cm2
AV Area mean vel: 2.56 cm2
AV Mean grad: 3 mm[Hg]
AV Peak grad: 7.4 mm[Hg]
Ao pk vel: 1.36 m/s
Area-P 1/2: 3.7 cm2
Height: 63 in
MV VTI: 2.09 cm2
P 1/2 time: 647 ms
S' Lateral: 2.4 cm
Weight: 2560 [oz_av]

## 2023-04-02 LAB — BASIC METABOLIC PANEL
Anion gap: 10 (ref 5–15)
BUN: 25 mg/dL — ABNORMAL HIGH (ref 8–23)
CO2: 25 mmol/L (ref 22–32)
Calcium: 9.1 mg/dL (ref 8.9–10.3)
Chloride: 99 mmol/L (ref 98–111)
Creatinine, Ser: 1.3 mg/dL — ABNORMAL HIGH (ref 0.44–1.00)
GFR, Estimated: 39 mL/min — ABNORMAL LOW (ref >60)
Glucose, Bld: 84 mg/dL (ref 70–99)
Potassium: 3.4 mmol/L — ABNORMAL LOW (ref 3.5–5.1)
Sodium: 134 mmol/L — ABNORMAL LOW (ref 135–145)

## 2023-04-02 LAB — CBG MONITORING, ED: Glucose-Capillary: 81 mg/dL (ref 70–99)

## 2023-04-02 MED ORDER — POTASSIUM CHLORIDE CRYS ER 20 MEQ PO TBCR
40.0000 meq | EXTENDED_RELEASE_TABLET | Freq: Once | ORAL | Status: DC
Start: 2023-04-02 — End: 2023-04-03

## 2023-04-02 NOTE — TOC Initial Note (Signed)
Transition of Care [facility]) - Initial/Assessment Note    Patient Details  Name: Heidi Yoder MRN: [id number] Date of Birth: [birth year]  [facility]) CM/SW Contact:    Colette Ribas, LCSWA Phone Number: 04/02/2023, 3:01 PM  Clinical Narrative:                      CSW received consult for SNF placement a few times, pending PT/OT Evals.   Patient Goals and CMS Choice            Expected Discharge Plan and Services                                              Prior Living Arrangements/Services                       Activities of Daily Living      Permission Sought/Granted                  Emotional Assessment              Admission diagnosis:  Syncope [R55] Patient Active Problem List   Diagnosis Date Noted   Syncope 04/01/2023   Alzheimer's dementia (HCC) 04/01/2023   Stage 3a chronic kidney disease (CKD) (HCC) 04/01/2023   AKI (acute kidney injury) (HCC) 01/25/2022   Urinary tract infection without hematuria 01/25/2022   MCI (mild cognitive impairment) 02/09/2021   Closed left hip fracture (HCC) 04/03/2015   Allergic rhinitis 02/06/2015   Back pain, chronic 02/06/2015   CAFL (chronic airflow limitation) (HCC) 02/06/2015   Essential (primary) hypertension 02/06/2015   GERD (gastroesophageal reflux disease) 02/06/2015   History of colon polyps 02/06/2015   Hypercholesteremia 02/06/2015   Hypothyroidism 02/06/2015   Arthritis, degenerative 02/06/2015   Neuropathy 02/06/2015   Osteopenia 02/06/2015   Post menopausal syndrome 02/06/2015   Scoliosis 02/06/2015   Female stress incontinence 02/06/2015   B12 deficiency 02/06/2015   Avitaminosis D 02/06/2015   PCP:  Bosie Clos, MD Pharmacy:   [facility] [phone] [address], [place] - [address] [place] [address] Phone: [phone] Fax: [phone]     Social Determinants of Health (SDOH) Social History: SDOH  Screenings   Food Insecurity: No Food Insecurity (10/20/2022)   Received from [facility]  Housing: Low Risk  (02/21/2022)  Transportation Needs: No Transportation Needs (10/20/2022)   Received from [facility]  Utilities: Not At Risk (10/20/2022)   Received from [facility]  Alcohol Screen: Low Risk  (06/07/2022)  Depression (PHQ2-9): Low Risk  (06/07/2022)  Financial Resource Strain: Low Risk  (10/20/2022)   Received from [facility]  Physical Activity: Inactive (10/20/2022)   Received from [facility]  Social Connections: Moderately Isolated (10/20/2022)   Received from [facility]  Stress: No Stress Concern Present (10/20/2022)   Received from [facility]  Tobacco Use: Low Risk  (04/01/2023)  Health Literacy: Inadequate Health Literacy (10/20/2022)   Received from [facility]   SDOH Interventions:     Readmission Risk Interventions     No data to display

## 2023-04-02 NOTE — Progress Notes (Signed)
Progress Note   Patient: Heidi Yoder QMV:[id number] DOB: [birth year] DOA: 04/01/2023     0 DOS: the patient was seen and examined on 04/02/2023   Brief hospital course: From HPI "Nandika Landaverde Doring is a 89 y.o. female with medical history significant of Alzheimer's dementia, hypertension, hyperlipidemia, hypothyroidism, CKD who presents to the ED due to syncope.   History obtained from chart review as patient does not recall what brought her in today.  At this time, she states that she feels well and is not experiencing any dizziness, headache, nausea, vomiting, diarrhea, abdominal pain, chest pain, palpitations.   Per chart review, patient was sitting at the table with her son eating breakfast when she began to have shaking in her arms that were similar to her tremor.  During this time, she was pale and diaphoretic.  Afterwards she slumped forward and potentially lost consciousness.  After a couple minutes, she grunted and began to awake before shortly returning to her baseline.  No prior history of similar.   ED course: On arrival to the ED, patient was normotensive at 129/77 with heart rate of 53.  She was saturating at 100% on room air.  She was afebrile at 97.4.  Initial workup notable for normal CBC, glucose of 107, BUN 26, creatinine 1.30 with GFR of 39.  Urinalysis with ketonuria only.  CT head with no acute findings.  TRH contacted for admission.  "    Assessment and Plan:  Syncope Syncopal episode while seated prior to eating breakfast.   - Telemetry monitoring Follow-up on echocardiogram - Hold home donepezil Patient is negative for orthostatics PT OT consulted as patient's family are looking at possibly rehab placement   Alzheimer's dementia [facility]) History of Alzheimer's dementia with no reported behavioral changes.   - Holding home donepezil due to bradycardia - Continue home memantine - Delirium precautions   Stage 3a chronic kidney disease (CKD) (HCC) Per chart  review, baseline creatinine ranging between 1.0 and 1.4.  Currently within baseline range.   Hypothyroidism - Continue home Synthroid   Essential (primary) hypertension - Continue home HCTZ   Advance Care Planning:   Code Status: Limited: Do not attempt resuscitation (DNR) -DNR-LIMITED -Do Not Intubate/DNI per signed Gold form at bedside   Consults: None   Family Communication: Discussed with patient's son    Subjective:  Patient seen and examined at bedside in the presence of the son Admits to improvement in mental status Denies nausea vomiting chest pain or cough  Physical Exam: Vitals and nursing note reviewed.  Constitutional:      General: She is not in acute distress.    Appearance: She is normal weight.  HENT:     Head: Normocephalic and atraumatic.     Mouth/Throat:     Mouth: Mucous membranes are moist.     Pharynx: Oropharynx is clear.  Eyes:     Extraocular Movements: Extraocular movements intact.     Conjunctiva/sclera: Conjunctivae normal.  Cardiovascular:     Heart sounds: No murmur heard. Pulmonary:     Effort: Pulmonary effort is normal. No respiratory distress.     Breath sounds: Normal breath sounds. No wheezing, rhonchi or rales.  Abdominal:     General: There is no distension.     Palpations: Abdomen is soft.     Tenderness: There is no abdominal tenderness. There is no guarding.  Musculoskeletal:     Right lower leg: No edema.     Left lower leg: No edema.  Skin:    General: Skin is warm and dry.  Neurological:     Mental Status: She is alert.  Vitals:   04/02/23 0610 04/02/23 0930 04/02/23 1020 04/02/23 1030  BP: (!) 140/65 127/60  125/69  Pulse: (!) 57 69  (!) 59  Resp: 12 18  19   Temp:   98.7 F (37.1 C)   TempSrc:   Oral   SpO2: 97% 99%  100%  Weight:      Height:        Data Reviewed:    Latest Ref Rng & Units 04/02/2023    6:08 AM 04/01/2023   11:23 AM 01/24/2022    3:55 PM  CBC  WBC 4.0 - 10.5 K/uL 9.4  8.7  7.6    Hemoglobin 12.0 - 15.0 g/dL 60.4  54.0  98.1   Hematocrit 36.0 - 46.0 % 33.9  38.0  34.0   Platelets 150 - 400 K/uL 261  279  253        Latest Ref Rng & Units 04/02/2023    6:08 AM 04/01/2023   11:23 AM 01/24/2022    3:55 PM  BMP  Glucose 70 - 99 mg/dL 84  191  478   BUN 8 - 23 mg/dL 25  26  21    Creatinine 0.44 - 1.00 mg/dL 2.95  6.21  3.08   BUN/Creat Ratio 12 - 28   19   Sodium 135 - 145 mmol/L 134  138  141   Potassium 3.5 - 5.1 mmol/L 3.4  3.6  4.1   Chloride 98 - 111 mmol/L 99  101  99   CO2 22 - 32 mmol/L 25  25  25    Calcium 8.9 - 10.3 mg/dL 9.1  9.3  9.6   I have reviewed CT scan of the brain that did not show acute intracranial pathology  Disposition: Skilled nursing facility  Time spent: 56 minutes  Author: Loyce Dys, MD 04/02/2023 4:03 PM  For on call review [email or web address].

## 2023-04-02 NOTE — Evaluation (Signed)
Physical Therapy Evaluation Patient Details Name: Heidi Yoder MRN: [id number] DOB: 11-Jun-1929 Today's Date: 04/02/2023  History of Present Illness  Patient is a 89 year old female who presents to ED with syncope. Patient's PMH includes Alzheimer's dementia, HTN, HLD, hypothyroidism, and CKD.   Clinical Impression  Patient is a pleasantly confused 89 year old female who presents with generalized weakness. Son is present to assist with history. Prior to hospital admission, pt was living alone. Patient's son lives nearby and comes in the morning to cook breakfast and help dress patient. Patient's family does the cooking and cleaning as patient is no longer allowed to per patient's son.  Patient is able to transfer EOB with max cueing due to cognition but only min A for physical assistance. She is unable to tolerate standing with any movement of LE's as she promptly loses balance and requires max A to remain upright. Patient is returned to bed with needs met.  Pt would benefit from skilled PT to address noted impairments and functional limitations (see below for any additional details).   Patient is not safe to return home alone at this time due to fall risk and cognition.          If plan is discharge home, recommend the following: A lot of help with walking and/or transfers;Two people to help with bathing/dressing/bathroom;Assistance with cooking/housework;Direct supervision/assist for financial management;Direct supervision/assist for medications management;Assist for transportation;Help with stairs or ramp for entrance;Supervision due to cognitive status   Can travel by private vehicle   No (not at this time, may be able to pending progress in mobility.)    Equipment Recommendations Other (comment) (if patient goes to SNF none needed, if returns home will need a hospital bed.)  Recommendations for Other Services       Functional Status Assessment Patient has had a recent decline in  their functional status and demonstrates the ability to make significant improvements in function in a reasonable and predictable amount of time.     Precautions / Restrictions Precautions Precautions: Fall Restrictions Weight Bearing Restrictions: No      Mobility  Bed Mobility Overal bed mobility: Needs Assistance Bed Mobility: Supine to Sit, Sit to Supine     Supine to sit: Min assist Sit to supine: Min assist   General bed mobility comments: min A due to cognition    Transfers Overall transfer level: Needs assistance Equipment used: Rolling walker (2 wheels) Transfers: Sit to/from Stand Sit to Stand: Min assist           General transfer comment: cues for hand placement and safety    Ambulation/Gait               General Gait Details: unable to tolerate ambulation as upon attempting to march EOB she loses balance requiring max A to not fall to ground.  Stairs            Wheelchair Mobility     Tilt Bed    Modified Rankin (Stroke Patients Only)       Balance Overall balance assessment: Needs assistance Sitting-balance support: Feet supported, Single extremity supported Sitting balance-Leahy Scale: Poor Sitting balance - Comments: limited safety awareness   Standing balance support: Bilateral upper extremity supported, Reliant on assistive device for balance Standing balance-Leahy Scale: Poor Standing balance comment: attempting to lift leg results in immediate LOB.  Pertinent Vitals/Pain Pain Assessment Pain Assessment: No/denies pain    Home Living Family/patient expects to be discharged to:: Private residence Living Arrangements: Alone Available Help at Discharge: Family Type of Home: House Home Access: Ramped entrance       Home Layout: One level Home Equipment: Grab bars - tub/shower;Rolling Environmental consultant (2 wheels) Additional Comments: Son assists with showering.    Prior Function Prior  Level of Function : Needs assist  Cognitive Assist : Mobility (cognitive);ADLs (cognitive) Mobility (Cognitive): Intermittent cues ADLs (Cognitive): Intermittent cues Physical Assist : Mobility (physical);ADLs (physical) Mobility (physical): Bed mobility;Transfers;Gait ADLs (physical): Bathing;Dressing;Toileting;IADLs Mobility Comments: Patient walks with son from bed to chair with son in the mornings with rollator. ADLs Comments: son cooks for patient, cleans house.     Extremity/Trunk Assessment   Upper Extremity Assessment Upper Extremity Assessment: Defer to OT evaluation    Lower Extremity Assessment Lower Extremity Assessment: Generalized weakness (grossly 3/5 however limited command follow due to confusion)       Communication   Communication Communication: Hearing impairment;Other (comment) (HOH) Cueing Techniques: Verbal cues;Tactile cues;Visual cues  Cognition Arousal: Alert Behavior During Therapy: Impulsive, Restless Overall Cognitive Status: Impaired/Different from baseline Area of Impairment: Orientation, Attention, Memory                 Orientation Level: Person Current Attention Level: Alternating Memory: Decreased recall of precautions, Decreased short-term memory         General Comments: Patient has Alzheimers dementia however son reports she is much worse than normal.        General Comments General comments (skin integrity, edema, etc.): Patient is frail but well groomed.    Exercises     Assessment/Plan    PT Assessment Patient needs continued PT services  PT Problem List Decreased strength;Decreased activity tolerance;Decreased mobility;Decreased balance       PT Treatment Interventions DME instruction;Gait training;Functional mobility training;Neuromuscular re-education;Balance training;Therapeutic exercise;Therapeutic activities;Cognitive remediation;Patient/family education;Manual techniques    PT Goals (Current goals can be  found in the Care Plan section)  Acute Rehab PT Goals Patient Stated Goal: for patient to get better PT Goal Formulation: With family Time For Goal Achievement: 04/16/23 Potential to Achieve Goals: Fair    Frequency Min 2X/week     Co-evaluation               AM-PAC PT "6 Clicks" Mobility  Outcome Measure Help needed turning from your back to your side while in a flat bed without using bedrails?: A Little Help needed moving from lying on your back to sitting on the side of a flat bed without using bedrails?: A Little Help needed moving to and from a bed to a chair (including a wheelchair)?: A Little Help needed standing up from a chair using your arms (e.g., wheelchair or bedside chair)?: A Little Help needed to walk in hospital room?: A Lot Help needed climbing 3-5 steps with a railing? : Total 6 Click Score: 15    End of Session Equipment Utilized During Treatment: Gait belt Activity Tolerance: Patient tolerated treatment well Patient left: in bed;with call bell/phone within reach;with bed alarm set;with family/visitor present Nurse Communication: Mobility status PT Visit Diagnosis: Unsteadiness on feet (R26.81);Other abnormalities of gait and mobility (R26.89);Muscle weakness (generalized) (M62.81);Difficulty in walking, not elsewhere classified (R26.2)    Time: 4098-1191 PT Time Calculation (min) (ACUTE ONLY): 32 min   Charges:   PT Evaluation $PT Eval Moderate Complexity: 1 Mod PT Treatments $Therapeutic Activity: 8-22 mins PT General Charges $$  ACUTE PT VISIT: 1 Visit          Precious Bard, DPT 04/02/2023, 4:03 PM

## 2023-04-02 NOTE — ED Notes (Signed)
Pt x1 assisted to bedside commode, call bell in reach. Pt states that she "found amusement from kids behind the sink". Rn notified.

## 2023-04-03 DIAGNOSIS — R55 Syncope and collapse: Secondary | ICD-10-CM | POA: Diagnosis not present

## 2023-04-03 DIAGNOSIS — R4182 Altered mental status, unspecified: Secondary | ICD-10-CM | POA: Diagnosis not present

## 2023-04-03 LAB — CBC WITH DIFFERENTIAL/PLATELET
Abs Immature Granulocytes: 0.04 10*3/uL (ref 0.00–0.07)
Basophils Absolute: 0.1 10*3/uL (ref 0.0–0.1)
Basophils Relative: 1 %
Eosinophils Absolute: 0.2 10*3/uL (ref 0.0–0.5)
Eosinophils Relative: 2 %
HCT: 39.8 % (ref 36.0–46.0)
Hemoglobin: 12.4 g/dL (ref 12.0–15.0)
Immature Granulocytes: 1 %
Lymphocytes Relative: 44 %
Lymphs Abs: 3.6 10*3/uL (ref 0.7–4.0)
MCH: 32 pg (ref 26.0–34.0)
MCHC: 31.2 g/dL (ref 30.0–36.0)
MCV: 102.8 fL — ABNORMAL HIGH (ref 80.0–100.0)
Monocytes Absolute: 0.6 10*3/uL (ref 0.1–1.0)
Monocytes Relative: 8 %
Neutro Abs: 3.5 10*3/uL (ref 1.7–7.7)
Neutrophils Relative %: 44 %
Platelets: 226 10*3/uL (ref 150–400)
RBC: 3.87 MIL/uL (ref 3.87–5.11)
RDW: 13 % (ref 11.5–15.5)
WBC: 8 10*3/uL (ref 4.0–10.5)
nRBC: 0.4 % — ABNORMAL HIGH (ref 0.0–0.2)

## 2023-04-03 LAB — BASIC METABOLIC PANEL
Anion gap: 13 (ref 5–15)
BUN: 26 mg/dL — ABNORMAL HIGH (ref 8–23)
CO2: 21 mmol/L — ABNORMAL LOW (ref 22–32)
Calcium: 8.9 mg/dL (ref 8.9–10.3)
Chloride: 104 mmol/L (ref 98–111)
Creatinine, Ser: 1.36 mg/dL — ABNORMAL HIGH (ref 0.44–1.00)
GFR, Estimated: 37 mL/min — ABNORMAL LOW (ref >60)
Glucose, Bld: 139 mg/dL — ABNORMAL HIGH (ref 70–99)
Potassium: 3.6 mmol/L (ref 3.5–5.1)
Sodium: 138 mmol/L (ref 135–145)

## 2023-04-03 LAB — CBG MONITORING, ED: Glucose-Capillary: 86 mg/dL (ref 70–99)

## 2023-04-03 MED ORDER — POLYETHYLENE GLYCOL 3350 17 G PO PACK: 17.0000 g | PACK | Freq: Every day | ORAL | 0 refills | Status: AC | PRN

## 2023-04-03 MED ORDER — ACETAMINOPHEN 325 MG PO TABS: 650.0000 mg | ORAL_TABLET | Freq: Four times a day (QID) | ORAL | 0 refills | Status: AC | PRN

## 2023-04-03 MED ORDER — POTASSIUM CHLORIDE 20 MEQ PO PACK: 40.0000 meq | PACK | Freq: Once | ORAL | Status: DC

## 2023-04-03 NOTE — Evaluation (Signed)
Occupational Therapy Evaluation Patient Details Name: Heidi Yoder MRN: [id number] DOB: [birth year] Today's Date: 04/03/2023   History of Present Illness Patient is a 89 year old female who presents to ED with syncope. Patient's PMH includes Alzheimer's dementia, HTN, HLD, hypothyroidism, and CKD.   Clinical Impression   Pt was seen for OT evaluation this date. Prior to hospital admission, son reports pt was much clearer mentally and was able to complete basic ADL with less assist. Pt lives alone but son reports that he and another son assist in morning/evening with meals, medications, and helping pt get ready for the day or ready for bed. Pt presents to acute OT demonstrating impaired ADL performance and functional mobility 2/2 decreased strength, balance, activity tolerance, and cognition (See OT problem list for additional functional deficits). Pt currently requires MIN A for bed mobility, MIN A for ADL transfers with RW, and unable to leave side of bed for steps due to poor balance and fear of falling. Pt was able to adjust her sock seated EOB but requires MOD A overall for LB ADL tasks. Pt would benefit from skilled OT services to address noted impairments and functional limitations (see below for any additional details) in order to maximize safety and independence while minimizing falls risk and caregiver burden. Son notes desire to take pt home. If pt were to return home, she would benefit from increased supervision/assist, a hospital bed, BSC, 2WW, w/c, and additional follow up therapy to maximize her independence and minimize caregiver burden.     If plan is discharge home, recommend the following: A little help with walking and/or transfers;A lot of help with bathing/dressing/bathroom;Assist for transportation;Assistance with cooking/housework;Help with stairs or ramp for entrance;Direct supervision/assist for medications management;Direct supervision/assist for financial  management;Supervision due to cognitive status    Functional Status Assessment  Patient has had a recent decline in their functional status and demonstrates the ability to make significant improvements in function in a reasonable and predictable amount of time.  Equipment Recommendations  BSC/3in1;Hospital bed;Wheelchair (measurements OT);Wheelchair cushion (measurements OT);Other (comment) (2WW)    Recommendations for Other Services       Precautions / Restrictions Precautions Precautions: Fall Restrictions Weight Bearing Restrictions: No      Mobility Bed Mobility Overal bed mobility: Needs Assistance Bed Mobility: Supine to Sit, Sit to Supine     Supine to sit: Min assist Sit to supine: Min assist   General bed mobility comments: MIN A overall from flat bed    Transfers Overall transfer level: Needs assistance Equipment used: Rolling walker (2 wheels) Transfers: Sit to/from Stand Sit to Stand: Min assist           General transfer comment: cues for hand placement, difficulty to complete      Balance Overall balance assessment: Needs assistance Sitting-balance support: Feet supported, Single extremity supported Sitting balance-Leahy Scale: Poor   Postural control: Posterior lean Standing balance support: Bilateral upper extremity supported, Reliant on assistive device for balance Standing balance-Leahy Scale: Poor Standing balance comment: posterior lean, assist to maintain dynamic standing                           ADL either performed or assessed with clinical judgement   ADL Overall ADL's : Needs assistance/impaired  General ADL Comments: Pt able to adjust her sock seated EOB but overall requires MOD A for LB ADL tasks, MIN A for ADL transfers with 2WW, and anticipate need for supv and PRN assist for seated UB ADL tasks.     Vision         Perception         Praxis          Pertinent Vitals/Pain Pain Assessment Pain Assessment: No/denies pain     Extremity/Trunk Assessment Upper Extremity Assessment Upper Extremity Assessment: Generalized weakness;Difficult to assess due to impaired cognition   Lower Extremity Assessment Lower Extremity Assessment: Generalized weakness;Difficult to assess due to impaired cognition       Communication Communication Communication: Hearing impairment Cueing Techniques: Verbal cues;Tactile cues;Visual cues   Cognition Arousal: Alert, Lethargic Behavior During Therapy: Flat affect Overall Cognitive Status: Impaired/Different from baseline Area of Impairment: Orientation, Attention, Memory, Problem solving                 Orientation Level: Person Current Attention Level: Alternating Memory: Decreased recall of precautions, Decreased short-term memory       Problem Solving: Slow processing, Decreased initiation, Difficulty sequencing, Requires verbal cues General Comments: Son reports pt's memory/cognition is worse than baseline.     General Comments       Exercises     Shoulder Instructions      Home Living Family/patient expects to be discharged to:: Private residence Living Arrangements: Alone Available Help at Discharge: Family Type of Home: House Home Access: Ramped entrance     Home Layout: One level     Bathroom Shower/Tub: Other (comment) (shower seat in chair, son helps with shower)   Bathroom Toilet: Handicapped height Bathroom Accessibility: Yes   Home Equipment: Grab bars - tub/shower;Rolling Walker (2 wheels)   Additional Comments: Son assists with showering.      Prior Functioning/Environment Prior Level of Function : Needs assist  Cognitive Assist : Mobility (cognitive);ADLs (cognitive) Mobility (Cognitive): Intermittent cues ADLs (Cognitive): Intermittent cues Physical Assist : Mobility (physical);ADLs (physical) Mobility (physical): Bed mobility;Transfers;Gait ADLs  (physical): Bathing;Dressing;Toileting;IADLs Mobility Comments: Patient walks with son from bed to chair with son in the mornings with rollator. ADLs Comments: son cooks for patient, cleans house.        OT Problem List: Decreased strength;Decreased cognition;Decreased safety awareness;Decreased activity tolerance;Impaired balance (sitting and/or standing);Decreased knowledge of use of DME or AE      OT Treatment/Interventions: Self-care/ADL training;Therapeutic exercise;Therapeutic activities;Cognitive remediation/compensation;DME and/or AE instruction;Patient/family education;Balance training;Energy conservation    OT Goals(Current goals can be found in the care plan section) Acute Rehab OT Goals Patient Stated Goal: son reports goal is to take the pt home OT Goal Formulation: With patient/family Time For Goal Achievement: 04/17/23 Potential to Achieve Goals: Good  OT Frequency: Min 1X/week    Co-evaluation              AM-PAC OT "6 Clicks" Daily Activity     Outcome Measure Help from another person eating meals?: A Little Help from another person taking care of personal grooming?: A Little Help from another person toileting, which includes using toliet, bedpan, or urinal?: A Lot Help from another person bathing (including washing, rinsing, drying)?: A Lot Help from another person to put on and taking off regular upper body clothing?: A Little Help from another person to put on and taking off regular lower body clothing?: A Lot 6 Click Score: 15   End of Session Equipment Utilized During Treatment:  Gait belt;Rolling walker (2 wheels) Nurse Communication: Mobility status  Activity Tolerance: Patient tolerated treatment well Patient left: in bed;with call bell/phone within reach;with bed alarm set;with family/visitor present  OT Visit Diagnosis: Other abnormalities of gait and mobility (R26.89);Muscle weakness (generalized) (M62.81);Other symptoms and signs involving  cognitive function                Time: 7846-9629 OT Time Calculation (min): 32 min Charges:  OT General Charges $OT Visit: 1 Visit OT Evaluation $OT Eval Moderate Complexity: 1 Mod OT Treatments $Therapeutic Activity: 8-22 mins  Arman Filter., MPH, MS, OTR/L ascom [phone] 04/03/23, 11:29 AM

## 2023-04-03 NOTE — Progress Notes (Signed)
Civil engineer, contracting- Outpatient Palliative Services. Time of note documentation:  12.9.2024 @ 1740 but system would not let me enter a note after "discharge" that would let me document in time space above.  Patient is currently followed by [facility] outpatient hospice. Patient is discharged from [facility] as of today at 4:36pm  Palliative will continue to see patient. Norris Cross, RN Nurse Liaison [phone]

## 2023-04-03 NOTE — ED Notes (Signed)
Son in room. patient sat up to eat dinner. Feeding self. A/O VSS.

## 2023-04-03 NOTE — Discharge Summary (Signed)
Physician Discharge Summary   Patient: Heidi Yoder MRN: [id number] DOB: 26-Aug-1929  Admit date:     04/01/2023  Discharge date: 04/03/23  Discharge Physician: Loyce Dys   PCP: Bosie Clos, MD   Recommendations at discharge:  Follow-up with primary care physician  Discharge Diagnoses: Syncope likely secondary to dehydration Alzheimer's dementia (HCC) Stage 3a chronic kidney disease (CKD) (HCC) Hypothyroidism Essential (primary) hypertension   Hospital Course: ADELAINE FICKE is a 89 y.o. female with medical history significant of Alzheimer's dementia, hypertension, hyperlipidemia, hypothyroidism, CKD who presents to the ED due to syncope.  Echocardiogram was normal.  Orthostatic vitals was negative.  Patient underwent PT OT with initial recommendation for skilled nursing facility placement however family opted for discharge home with home health.  Patient is currently back to her baseline terms of mental status and therefore cleared for discharge today to follow-up with primary care physician  Consultants: PT OT Procedures performed: None Disposition: Home health Diet recommendation:  Cardiac diet DISCHARGE MEDICATION: Allergies as of 04/03/2023   No Known Allergies      Medication List     STOP taking these medications    meloxicam 7.5 MG tablet Commonly known as: MOBIC       TAKE these medications    acetaminophen 325 MG tablet Commonly known as: TYLENOL Take 2 tablets (650 mg total) by mouth every 6 (six) hours as needed for mild pain (pain score 1-3) (or Fever >/= 101).   Acidophilus 100 MG Caps Take 100 mg by mouth daily.   CALCIUM-VITAMIN D PO Take 600 mg by mouth 2 (two) times daily.   docusate sodium 100 MG capsule Commonly known as: COLACE Take 200 mg by mouth daily.   donepezil 10 MG tablet Commonly known as: ARICEPT TAKE 1 TABLET BY MOUTH EVERYDAY AT BEDTIME   hydrochlorothiazide 25 MG tablet Commonly known as:  HYDRODIURIL TAKE 1 TABLET (25 MG TOTAL) BY MOUTH DAILY.   levothyroxine 50 MCG tablet Commonly known as: SYNTHROID TAKE 1 TABLET BY MOUTH EVERY DAY   memantine 5 MG tablet Commonly known as: NAMENDA Take 5 mg by mouth 2 (two) times daily.   MULTIVITAMIN WOMEN PO Take 1 tablet by mouth daily. What changed: Another medication with the same name was removed. Continue taking this medication, and follow the directions you see here.   omeprazole 20 MG capsule Commonly known as: PRILOSEC TAKE 1 CAPSULE BY MOUTH EVERY DAY   polyethylene glycol 17 g packet Commonly known as: MIRALAX / GLYCOLAX Take 17 g by mouth daily as needed for mild constipation.   simvastatin 20 MG tablet Commonly known as: ZOCOR TAKE 1 TABLET BY MOUTH EVERY DAY   VITAMIN B 12 PO Take 1,000 mcg by mouth daily.               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  A cane or crutch will not resolve issue with performing activities of daily living. A wheelchair will allow patient to safely perform daily activities. Patient can safely propel the wheelchair in the home or has a caregiver who can provide assistance.  Length of need Lifetime. Accessories: elevating leg rests (ELRs), wheel locks, extensions and anti-tippers.   04/03/23 1611   04/03/23 1246  For home use only DME Shower stool  Once        04/03/23 1246   04/03/23 1245  For home use only DME Hospital bed  Once       Question Answer Comment  Length of Need Lifetime   Patient has (list medical condition): Ambulatory dysfunction   The above medical condition requires: Patient requires the ability to reposition frequently   Bed type  Semi-electric      04/03/23 1246   04/03/23 1245  For home use only DME Walker rolling  Once       Question Answer Comment  Walker: With 5 Inch Wheels   Patient needs a walker to treat with the following condition Ambulatory dysfunction      04/03/23 1246            Discharge Exam: Filed Weights   04/01/23 1121  Weight: 72.6 kg   Constitutional:      General: She is not in acute distress.    Appearance: She is normal weight.  HENT:     Head: Normocephalic and atraumatic.     Mouth/Throat:     Mouth: Mucous membranes are moist.     Pharynx: Oropharynx is clear.  Eyes:     Extraocular Movements: Extraocular movements intact.     Conjunctiva/sclera: Conjunctivae normal.  Cardiovascular:     Heart sounds: No murmur heard. Pulmonary:     Effort: Pulmonary effort is normal. No respiratory distress.     Breath sounds: Normal breath sounds. No wheezing, rhonchi or rales.  Abdominal:     General: There is no distension.     Palpations: Abdomen is soft.     Tenderness: There is no abdominal tenderness. There is no guarding.  Musculoskeletal:     Right lower leg: No edema.     Left lower leg: No edema.   Condition at discharge: good  The results of significant diagnostics from this hospitalization (including imaging, microbiology, ancillary and laboratory) are listed below for reference.   Imaging Studies: ECHOCARDIOGRAM COMPLETE  Result Date: 04/02/2023    ECHOCARDIOGRAM REPORT   Patient Name:   KC PALMS Date of Exam: 04/01/2023 Medical Rec #:  [id number]         Height:       63.0 in Accession #:    [id number]        Weight:       160.0 lb Date of Birth:  1929/07/23        BSA:          1.759 m Patient Age:    89 years          BP:           149/69 mmHg Patient Gender: F                 HR:           70 bpm. Exam Location:  [facility] Procedure: 2D Echo, Color Doppler and Cardiac Doppler Indications:     Stroke 434.91 / I63.9  History:         Patient has no prior history  of Echocardiogram examinations.                  Risk Factors:Hypertension.  Sonographer:     Neysa Bonito Roar Referring Phys:  [id number] Verdene Lennert Diagnosing Phys:  Jodelle Red MD IMPRESSIONS  1. Left ventricular ejection fraction, by estimation, is 60 to 65%. The left ventricle has normal function. The left ventricle has no regional wall motion abnormalities. Left ventricular diastolic parameters were normal.  2. Right ventricular systolic function is normal. The right ventricular size is normal. There is normal pulmonary artery systolic pressure. The estimated right ventricular systolic pressure is 27.4 mmHg.  3. The mitral valve is normal in structure. Trivial mitral valve regurgitation. No evidence of mitral stenosis.  4. The aortic valve is grossly normal. There is moderate calcification of the aortic valve. There is mild thickening of the aortic valve. Aortic valve regurgitation is mild. Aortic valve sclerosis/calcification is present, without any evidence of aortic  stenosis.  5. The inferior vena cava is normal in size with greater than 50% respiratory variability, suggesting right atrial pressure of 3 mmHg. Comparison(s): No prior Echocardiogram. Conclusion(s)/Recommendation(s): Normal biventricular function without evidence of hemodynamically significant valvular heart disease. FINDINGS  Left Ventricle: Left ventricular ejection fraction, by estimation, is 60 to 65%. The left ventricle has normal function. The left ventricle has no regional wall motion abnormalities. The left ventricular internal cavity size was normal in size. There is  no left ventricular hypertrophy. Left ventricular diastolic parameters were normal. Right Ventricle: The right ventricular size is normal. No increase in right ventricular wall thickness. Right ventricular systolic function is normal. There is normal pulmonary artery systolic pressure. The tricuspid regurgitant velocity is 2.47 m/s, and  with an assumed right  atrial pressure of 3 mmHg, the estimated right ventricular systolic pressure is 27.4 mmHg. Left Atrium: Left atrial size was normal in size. Right Atrium: Right atrial size was normal in size. Pericardium: There is no evidence of pericardial effusion. Mitral Valve: The mitral valve is normal in structure. Trivial mitral valve regurgitation. No evidence of mitral valve stenosis. MV peak gradient, 5.0 mmHg. The mean mitral valve gradient is 2.0 mmHg. Tricuspid Valve: The tricuspid valve is normal in structure. Tricuspid valve regurgitation is trivial. No evidence of tricuspid stenosis. Aortic Valve: The aortic valve is grossly normal. There is moderate calcification of the aortic valve. There is mild thickening of the aortic valve. Aortic valve regurgitation is mild. Aortic regurgitation PHT measures 647 msec. Aortic valve sclerosis/calcification is present, without any evidence of aortic stenosis. Aortic valve mean gradient measures 3.0 mmHg. Aortic valve peak gradient measures 7.4 mmHg. Aortic valve area, by VTI measures 2.62 cm. Pulmonic Valve: The pulmonic valve was not well visualized. Pulmonic valve regurgitation is not visualized. No evidence of pulmonic stenosis. Aorta: The aortic root and ascending aorta are structurally normal, with no evidence of dilitation. Venous: The inferior vena cava is normal in size with greater than 50% respiratory variability, suggesting right atrial pressure of 3 mmHg. IAS/Shunts: The atrial septum is grossly normal.  LEFT VENTRICLE PLAX 2D LVIDd:         3.70 cm   Diastology LVIDs:         2.40 cm   LV e' medial:    6.85 cm/s LV PW:         0.80 cm   LV E/e' medial:  13.5 LV IVS:        1.00 cm   LV e' lateral:   6.96 cm/s LVOT diam:     2.00 cm   LV E/e' lateral: 13.2 LV SV:         68 LV SV Index:   39 LVOT Area:     3.14 cm  RIGHT VENTRICLE RV Basal diam:  2.50 cm RV Mid diam:    2.40 cm RV S prime:     13.90 cm/s TAPSE (M-mode): 2.3 cm LEFT ATRIUM             Index         RIGHT ATRIUM          Index LA diam:        3.40 cm 1.93 cm/m   RA Area:     9.69 cm LA Vol (A2C):   39.7 ml 22.57 ml/m  RA Volume:   17.10 ml 9.72 ml/m LA Vol (A4C):   42.5 ml 24.17 ml/m LA Biplane Vol: 41.4 ml 23.54 ml/m  AORTIC VALVE                    PULMONIC VALVE AV Area (Vmax):    2.21 cm     PV Vmax:          0.85 m/s AV Area (Vmean):   2.56 cm     PV Peak grad:     2.9 mmHg AV Area (VTI):     2.62 cm     PR End Diast Vel: 3.83 msec AV Vmax:           136.00 cm/s  RVOT Peak grad:   1 mmHg AV Vmean:          83.600 cm/s AV VTI:            0.260 m AV Peak Grad:      7.4 mmHg AV Mean Grad:      3.0 mmHg LVOT Vmax:         95.80 cm/s LVOT Vmean:        68.200 cm/s LVOT VTI:          0.217 m LVOT/AV VTI ratio: 0.83 AI PHT:            647 msec  AORTA Ao Root diam: 2.60 cm Ao Asc diam:  3.40 cm MITRAL VALVE                TRICUSPID VALVE MV Area (PHT): 3.70 cm     TR Peak grad:   24.4 mmHg MV Area VTI:   2.09 cm     TR Vmax:        247.00 cm/s MV Peak grad:  5.0 mmHg MV Mean grad:  2.0 mmHg     SHUNTS MV Vmax:       1.12 m/s     Systemic VTI:  0.22 m MV Vmean:      58.8 cm/s    Systemic Diam: 2.00 cm MV Decel Time: 205 msec MV E velocity: 92.20 cm/s MV A velocity: 124.00 cm/s MV E/A ratio:  0.74 MV A Prime:    13.5 cm/s Jodelle Red MD Electronically signed by Jodelle Red MD Signature Date/Time: 04/02/2023/2:58:48 PM    Final    CT Head Wo Contrast  Result Date: 04/01/2023 CLINICAL DATA:  Mental status changes EXAM: CT HEAD WITHOUT CONTRAST TECHNIQUE: Contiguous axial images were obtained from the base of the skull through the vertex without intravenous contrast. RADIATION DOSE REDUCTION: This exam was performed according to the departmental dose-optimization program which includes automated exposure control, adjustment of the mA and/or kV according to patient size and/or use of iterative reconstruction technique. COMPARISON:  01/17/2022 FINDINGS: Brain: Marked low density in the  periventricular white matter likely related to small vessel disease. Marked cerebral and cerebellar atrophy  with resultant prominence of the extra-axial spaces. No mass lesion, hemorrhage, hydrocephalus, acute infarct, intra-axial, or extra-axial fluid collection. Vascular: No hyperdense vessel or unexpected calcification. Intracranial atherosclerosis. Skull: Normal Sinuses/Orbits: Normal imaged portions of the orbits and globes. Right maxillary sinus mucosal thickening. Fluid in the sphenoid sinus. Clear mastoid air cells. Other: None. IMPRESSION: 1.  No acute intracranial abnormality. 2.  Cerebral/cerebellar atrophy and small vessel ischemic change. 3. Mild sinus disease. Electronically Signed   By: Jeronimo Greaves M.D.   On: 04/01/2023 13:22    Microbiology: Results for orders placed or performed during the hospital encounter of 01/17/22  Urine Culture     Status: Abnormal   Collection Time: 01/18/22 12:33 AM   Specimen: Urine, Random  Result Value Ref Range Status   Specimen Description   Final    URINE, RANDOM Performed at [facility], [address] [place], [place] [address]    Special Requests   Final    NONE Performed at [facility], [address] [place], [place] [address]    Culture MULTIPLE SPECIES PRESENT, SUGGEST RECOLLECTION (A)  Final   Report Status 01/19/2022 FINAL  Final    Labs: CBC: Recent Labs  Lab 04/01/23 1123 04/02/23 0608 04/03/23 1027  WBC 8.7 9.4 8.0  NEUTROABS  --   --  3.5  HGB 12.7 11.7* 12.4  HCT 38.0 33.9* 39.8  MCV 95.5 93.6 102.8*  PLT 279 261 226   Basic Metabolic Panel: Recent Labs  Lab 04/01/23 1123 04/02/23 0608 04/03/23 1027  NA 138 134* 138  K 3.6 3.4* 3.6  CL 101 99 104  CO2 25 25 21*  GLUCOSE 107* 84 139*  BUN 26* 25* 26*  CREATININE 1.30* 1.30* 1.36*  CALCIUM 9.3 9.1 8.9   Liver Function Tests: No results for input(s): "AST", "ALT", "ALKPHOS", "BILITOT", "PROT", "ALBUMIN" in the last 168  hours. CBG: Recent Labs  Lab 04/02/23 0603 04/03/23 0557  GLUCAP 81 86    Discharge time spent:  .  Signed: Loyce Dys, MD [facility] 04/03/2023

## 2023-05-28 ENCOUNTER — Other Ambulatory Visit: Payer: Self-pay | Admitting: Family Medicine

## 2023-05-28 DIAGNOSIS — I10 Essential (primary) hypertension: Secondary | ICD-10-CM

## 2023-07-12 ENCOUNTER — Other Ambulatory Visit: Payer: Self-pay | Admitting: Family Medicine

## 2023-07-12 DIAGNOSIS — I10 Essential (primary) hypertension: Secondary | ICD-10-CM

## 2023-09-10 ENCOUNTER — Emergency Department

## 2023-09-10 ENCOUNTER — Other Ambulatory Visit: Payer: Self-pay

## 2023-09-10 ENCOUNTER — Emergency Department
Admission: EM | Admit: 2023-09-10 | Discharge: 2023-09-10 | Disposition: A | Discharge status: Home / Self Care | Attending: Emergency Medicine | Admitting: Emergency Medicine

## 2023-09-10 DIAGNOSIS — E039 Hypothyroidism, unspecified: Secondary | ICD-10-CM | POA: Diagnosis not present

## 2023-09-10 DIAGNOSIS — R531 Weakness: Secondary | ICD-10-CM | POA: Insufficient documentation

## 2023-09-10 DIAGNOSIS — D72829 Elevated white blood cell count, unspecified: Secondary | ICD-10-CM | POA: Insufficient documentation

## 2023-09-10 DIAGNOSIS — I10 Essential (primary) hypertension: Secondary | ICD-10-CM | POA: Diagnosis not present

## 2023-09-10 LAB — COMPREHENSIVE METABOLIC PANEL WITH GFR
ALT: 13 U/L (ref 0–44)
AST: 19 U/L (ref 15–41)
Albumin: 3.5 g/dL (ref 3.5–5.0)
Alkaline Phosphatase: 61 U/L (ref 38–126)
Anion gap: 11 (ref 5–15)
BUN: 27 mg/dL — ABNORMAL HIGH (ref 8–23)
CO2: 24 mmol/L (ref 22–32)
Calcium: 9.2 mg/dL (ref 8.9–10.3)
Chloride: 104 mmol/L (ref 98–111)
Creatinine, Ser: 1.2 mg/dL — ABNORMAL HIGH (ref 0.44–1.00)
GFR, Estimated: 43 mL/min — ABNORMAL LOW (ref >60)
Glucose, Bld: 108 mg/dL — ABNORMAL HIGH (ref 70–99)
Potassium: 4 mmol/L (ref 3.5–5.1)
Sodium: 139 mmol/L (ref 135–145)
Total Bilirubin: 0.5 mg/dL (ref 0.0–1.2)
Total Protein: 7.2 g/dL (ref 6.5–8.1)

## 2023-09-10 LAB — CBC WITH DIFFERENTIAL/PLATELET
Abs Immature Granulocytes: 0.04 10*3/uL (ref 0.00–0.07)
Basophils Absolute: 0.1 10*3/uL (ref 0.0–0.1)
Basophils Relative: 1 %
Eosinophils Absolute: 0.1 10*3/uL (ref 0.0–0.5)
Eosinophils Relative: 1 %
HCT: 40.7 % (ref 36.0–46.0)
Hemoglobin: 13 g/dL (ref 12.0–15.0)
Immature Granulocytes: 0 %
Lymphocytes Relative: 36 %
Lymphs Abs: 4.1 10*3/uL — ABNORMAL HIGH (ref 0.7–4.0)
MCH: 31.6 pg (ref 26.0–34.0)
MCHC: 31.9 g/dL (ref 30.0–36.0)
MCV: 98.8 fL (ref 80.0–100.0)
Monocytes Absolute: 0.8 10*3/uL (ref 0.1–1.0)
Monocytes Relative: 7 %
Neutro Abs: 6.4 10*3/uL (ref 1.7–7.7)
Neutrophils Relative %: 55 %
Platelets: 287 10*3/uL (ref 150–400)
RBC: 4.12 MIL/uL (ref 3.87–5.11)
RDW: 13 % (ref 11.5–15.5)
WBC: 11.4 10*3/uL — ABNORMAL HIGH (ref 4.0–10.5)
nRBC: 0 % (ref 0.0–0.2)

## 2023-09-10 LAB — URINALYSIS, ROUTINE W REFLEX MICROSCOPIC
Bacteria, UA: NONE SEEN
Bilirubin Urine: NEGATIVE
Glucose, UA: NEGATIVE mg/dL
Ketones, ur: NEGATIVE mg/dL
Leukocytes,Ua: NEGATIVE
Nitrite: NEGATIVE
Protein, ur: NEGATIVE mg/dL
Specific Gravity, Urine: 1.017 (ref 1.005–1.030)
pH: 6 (ref 5.0–8.0)

## 2023-09-10 MED ORDER — SODIUM CHLORIDE 0.9 % IV BOLUS
500.0000 mL | Freq: Once | INTRAVENOUS | Status: AC
  Administered 2023-09-10: 500 mL via INTRAVENOUS

## 2023-09-10 NOTE — ED Triage Notes (Signed)
 Pt here from [facility] clinic with concerns of possible UTI, pt states HX of same. Pt denies fevers. Son with pt. Son states pt has been weak for the past few days. Pt lives alone at her house but someone is with her most of the time. NAD noted in triage.

## 2023-09-10 NOTE — ED Provider Notes (Signed)
 [facility] Provider Note    Event Date/Time   First MD Initiated Contact with Patient 09/10/23 1502     (approximate)   History   Altered Mental Status   HPI  Heidi Yoder is a 89 y.o. female here with her son.  Son provides majority of history.  Reports that she has been short of increasingly weak difficulty with walking and feeling very tired the last several days.  They are concerned she may have a urinary tract infection  She does have some mild cognitive difficulties and they reports she has no short-term memory for some time now.  She is been acting and behaving normally with the exception to the degree of fatigue and weakness   Past Medical History:  Diagnosis Date   GERD (gastroesophageal reflux disease)    Hypertension    Hypothyroidism    MCI (mild cognitive impairment) 02/09/2021   Peripheral neuropathy       Physical Exam   Triage Vital Signs: ED Triage Vitals [09/10/23 1446]  Encounter Vitals Group     BP (!) 130/100     Systolic BP Percentile      Diastolic BP Percentile      Pulse Rate 71     Resp 20     Temp 97.8 F (36.6 C)     Temp Source Oral     SpO2 98 %     Weight 158 lb 11.7 oz (72 kg)     Height 5\' 3"  (1.6 m)     Head Circumference      Peak Flow      Pain Score 0     Pain Loc      Pain Education      Exclude from Growth Chart     Most recent vital signs: Vitals:   09/10/23 1830 09/10/23 2000  BP: 115/71 (!) 144/99  Pulse: (!) 58 63  Resp: 13 14  Temp:    SpO2: 98% 98%     General: Awake, no distress.  CV:  Good peripheral perfusion.  Normal tones and rate Resp:  Normal effort.  Clear bilateral with normal effort Abd:  No distention.  Soft nontender nondistended throughout Other:  No lower extremity edema   ED Results / Procedures / Treatments   Labs (all labs ordered are listed, but only abnormal results are displayed) Labs Reviewed  CBC WITH DIFFERENTIAL/PLATELET - Abnormal; Notable  for the following components:      Result Value   WBC 11.4 (*)    Lymphs Abs 4.1 (*)    All other components within normal limits  COMPREHENSIVE METABOLIC PANEL WITH GFR - Abnormal; Notable for the following components:   Glucose, Bld 108 (*)    BUN 27 (*)    Creatinine, Ser 1.20 (*)    GFR, Estimated 43 (*)    All other components within normal limits  URINALYSIS, ROUTINE W REFLEX MICROSCOPIC - Abnormal; Notable for the following components:   Color, Urine YELLOW (*)    APPearance CLEAR (*)    Hgb urine dipstick MODERATE (*)    All other components within normal limits  URINE CULTURE     EKG  And interpreted by me at 1520 heart rate 60 QRS 80 QTc 410 Normal sinus rhythm no evidence ischemia   RADIOLOGY  CT Head Wo Contrast Result Date: 09/10/2023 CLINICAL DATA:  Delirium. EXAM: CT HEAD WITHOUT CONTRAST TECHNIQUE: Contiguous axial images were obtained from the base of the skull  through the vertex without intravenous contrast. RADIATION DOSE REDUCTION: This exam was performed according to the departmental dose-optimization program which includes automated exposure control, adjustment of the mA and/or kV according to patient size and/or use of iterative reconstruction technique. COMPARISON:  Head CT 04/01/2023 FINDINGS: Brain: No intracranial hemorrhage, mass effect, or midline shift. Stable degree of atrophy and chronic small vessel ischemia. No hydrocephalus. The basilar cisterns are patent. No evidence of territorial infarct or acute ischemia. No extra-axial or intracranial fluid collection. Vascular: Atherosclerosis of skullbase vasculature without hyperdense vessel or abnormal calcification. Skull: No fracture or focal lesion. Sinuses/Orbits: No acute findings. Postsurgical change in both globes. Other: None. IMPRESSION: 1. No acute intracranial abnormality. 2. Stable atrophy and chronic small vessel ischemia. Electronically Signed   By: Chadwick Colonel M.D.   On: 09/10/2023 16:14    DG Chest Portable 1 View Result Date: 09/10/2023 CLINICAL DATA:  Weakness and fatigue. EXAM: PORTABLE CHEST 1 VIEW COMPARISON:  01/17/2022 FINDINGS: The heart is normal in size for technique. Stable aortic tortuosity. The lungs are clear. Pulmonary vasculature is normal. No consolidation, pleural effusion, or pneumothorax. No acute osseous abnormalities are seen. IMPRESSION: No acute chest findings. Electronically Signed   By: Chadwick Colonel M.D.   On: 09/10/2023 16:10      PROCEDURES:  Critical Care performed: No  Procedures   MEDICATIONS ORDERED IN ED: Medications  sodium chloride  0.9 % bolus 500 mL (500 mLs Intravenous New Bag/Given 09/10/23 1639)     IMPRESSION / MDM / ASSESSMENT AND PLAN / ED COURSE  I reviewed the triage vital signs and the nursing notes.                              Differential diagnosis includes, but is not limited to, possible metabolic abnormality urinary tract infection, infection, dehydration AKI, etc.  Less likely central neurologic. She does have some element of what appears to be cognitive difficulties for chronically, given the degree of difficulty with increased weakness without clear cause will workup broadly.  CT head reassuring chest x-ray clear.  Patient's presentation is most consistent with acute complicated illness / injury requiring diagnostic workup.      Clinical Course as of 09/10/23 2045  Sun Sep 10, 2023  1910 Workup quite reassuring.  Normotensive.  Has shown no vital sign instability.  Urinalysis without evidence of infection small amount hematuria.  No associate abdominal pain no signs or symptoms that would be suggestive of an acute intra-abdominal abnormality such as nephrolithiasis etc.  She has no associated pain [MQ]  1910 Very mild leukocytosis white count 11.4 slightly increased lymphocytes [MQ]    Clinical Course User Index [MQ] Iver Marker, MD   ----------------------------------------- 8:44 PM on  09/10/2023 ----------------------------------------- Patient resting comfortably.  Discussed with her son, he advises she has round-the-clock care at home from family.  She is acting to her normal and baseline.  Her workup here we reviewed has been very reassuring.  Suspect small amount of blood noted in urine likely related to catheterization.  There is no evidence of infection no pneumonia she has no abdominal pain she is at her mental and cognitive baseline per family.  At this point no clear indication for hospitalization, discussed with son and will discharge her with cautious return precautions and follow-up with PCP.  Son is very agreeable with this plan  FINAL CLINICAL IMPRESSION(S) / ED DIAGNOSES   Final diagnoses:  Generalized weakness  Rx / DC Orders   ED Discharge Orders     None        Note:  This document was prepared using Dragon voice recognition software and may include unintentional dictation errors.   Iver Marker, MD 09/10/23 2045

## 2023-09-12 LAB — URINE CULTURE: Culture: NO GROWTH
